# Patient Record
Sex: Female | Born: 1940 | Race: White | Hispanic: No | Smoking: Never smoker
Health system: Southern US, Community
[De-identification: ages and names within clinical notes are randomized; demographics above are authoritative.]

## PROBLEM LIST (undated history)

## (undated) DIAGNOSIS — M1990 Unspecified osteoarthritis, unspecified site: Secondary | ICD-10-CM

## (undated) DIAGNOSIS — R160 Hepatomegaly, not elsewhere classified: Secondary | ICD-10-CM

## (undated) DIAGNOSIS — E785 Hyperlipidemia, unspecified: Secondary | ICD-10-CM

## (undated) DIAGNOSIS — H53009 Unspecified amblyopia, unspecified eye: Secondary | ICD-10-CM

## (undated) DIAGNOSIS — I517 Cardiomegaly: Secondary | ICD-10-CM

## (undated) DIAGNOSIS — H3530 Unspecified macular degeneration: Secondary | ICD-10-CM

## (undated) DIAGNOSIS — I341 Nonrheumatic mitral (valve) prolapse: Secondary | ICD-10-CM

## (undated) DIAGNOSIS — E11319 Type 2 diabetes mellitus with unspecified diabetic retinopathy without macular edema: Secondary | ICD-10-CM

## (undated) DIAGNOSIS — I10 Essential (primary) hypertension: Secondary | ICD-10-CM

## (undated) DIAGNOSIS — E119 Type 2 diabetes mellitus without complications: Secondary | ICD-10-CM

## (undated) DIAGNOSIS — K5790 Diverticulosis of intestine, part unspecified, without perforation or abscess without bleeding: Secondary | ICD-10-CM

## (undated) DIAGNOSIS — K635 Polyp of colon: Secondary | ICD-10-CM

## (undated) DIAGNOSIS — K275 Chronic or unspecified peptic ulcer, site unspecified, with perforation: Secondary | ICD-10-CM

## (undated) DIAGNOSIS — J189 Pneumonia, unspecified organism: Secondary | ICD-10-CM

## (undated) DIAGNOSIS — I00 Rheumatic fever without heart involvement: Secondary | ICD-10-CM

## (undated) HISTORY — DX: Diverticulosis of intestine, part unspecified, without perforation or abscess without bleeding: K57.90

## (undated) HISTORY — PX: HEAD & NECK SKIN LESION EXCISIONAL BIOPSY: SUR472

## (undated) HISTORY — PX: BREAST EXCISIONAL BIOPSY: SUR124

## (undated) HISTORY — PX: APPENDECTOMY: SHX54

## (undated) HISTORY — DX: Hepatomegaly, not elsewhere classified: R16.0

## (undated) HISTORY — PX: CHOLECYSTECTOMY: SHX55

## (undated) HISTORY — PX: CATARACT EXTRACTION: SUR2

## (undated) HISTORY — DX: Unspecified amblyopia, unspecified eye: H53.009

## (undated) HISTORY — DX: Hyperlipidemia, unspecified: E78.5

## (undated) HISTORY — PX: DILATION AND CURETTAGE OF UTERUS: SHX78

## (undated) HISTORY — DX: Polyp of colon: K63.5

## (undated) HISTORY — DX: Pneumonia, unspecified organism: J18.9

## (undated) HISTORY — PX: ABDOMINAL HYSTERECTOMY: SHX81

## (undated) HISTORY — PX: BLADDER SUSPENSION: SHX72

## (undated) HISTORY — PX: BREAST BIOPSY: SHX20

## (undated) HISTORY — DX: Cardiomegaly: I51.7

## (undated) HISTORY — DX: Type 2 diabetes mellitus with unspecified diabetic retinopathy without macular edema: E11.319

## (undated) HISTORY — PX: EYE SURGERY: SHX253

---

## 2000-02-21 ENCOUNTER — Other Ambulatory Visit: Admission: RE | Admit: 2000-02-21 | Discharge: 2000-02-21 | Payer: Self-pay | Admitting: Internal Medicine

## 2000-03-07 ENCOUNTER — Encounter: Admission: RE | Admit: 2000-03-07 | Discharge: 2000-03-07 | Payer: Self-pay | Admitting: Internal Medicine

## 2000-03-07 ENCOUNTER — Encounter: Payer: Self-pay | Admitting: Internal Medicine

## 2000-06-21 ENCOUNTER — Encounter: Payer: Self-pay | Admitting: Internal Medicine

## 2000-06-21 ENCOUNTER — Encounter: Admission: RE | Admit: 2000-06-21 | Discharge: 2000-06-21 | Payer: Self-pay | Admitting: Internal Medicine

## 2001-03-16 ENCOUNTER — Encounter: Admission: RE | Admit: 2001-03-16 | Discharge: 2001-03-16 | Payer: Self-pay | Admitting: Internal Medicine

## 2001-03-16 ENCOUNTER — Encounter: Payer: Self-pay | Admitting: Internal Medicine

## 2001-04-13 ENCOUNTER — Other Ambulatory Visit: Admission: RE | Admit: 2001-04-13 | Discharge: 2001-04-13 | Payer: Self-pay | Admitting: Internal Medicine

## 2001-08-16 ENCOUNTER — Encounter (INDEPENDENT_AMBULATORY_CARE_PROVIDER_SITE_OTHER): Payer: Self-pay

## 2001-08-16 ENCOUNTER — Ambulatory Visit (HOSPITAL_COMMUNITY): Admission: RE | Admit: 2001-08-16 | Discharge: 2001-08-16 | Payer: Self-pay | Admitting: Obstetrics & Gynecology

## 2002-03-19 ENCOUNTER — Encounter: Payer: Self-pay | Admitting: Internal Medicine

## 2002-03-19 ENCOUNTER — Encounter: Admission: RE | Admit: 2002-03-19 | Discharge: 2002-03-19 | Payer: Self-pay | Admitting: Internal Medicine

## 2002-07-01 ENCOUNTER — Other Ambulatory Visit: Admission: RE | Admit: 2002-07-01 | Discharge: 2002-07-01 | Payer: Self-pay | Admitting: Obstetrics & Gynecology

## 2003-02-03 ENCOUNTER — Encounter: Admission: RE | Admit: 2003-02-03 | Discharge: 2003-02-03 | Payer: Self-pay | Admitting: General Surgery

## 2003-02-03 ENCOUNTER — Encounter: Payer: Self-pay | Admitting: General Surgery

## 2003-02-18 ENCOUNTER — Encounter: Payer: Self-pay | Admitting: Internal Medicine

## 2003-02-18 ENCOUNTER — Encounter: Admission: RE | Admit: 2003-02-18 | Discharge: 2003-02-18 | Payer: Self-pay | Admitting: Internal Medicine

## 2003-07-11 ENCOUNTER — Other Ambulatory Visit: Admission: RE | Admit: 2003-07-11 | Discharge: 2003-07-11 | Payer: Self-pay | Admitting: Obstetrics & Gynecology

## 2004-02-09 ENCOUNTER — Encounter: Admission: RE | Admit: 2004-02-09 | Discharge: 2004-02-09 | Payer: Self-pay | Admitting: Internal Medicine

## 2004-07-28 ENCOUNTER — Other Ambulatory Visit: Admission: RE | Admit: 2004-07-28 | Discharge: 2004-07-28 | Payer: Self-pay | Admitting: Obstetrics & Gynecology

## 2005-02-10 ENCOUNTER — Encounter: Admission: RE | Admit: 2005-02-10 | Discharge: 2005-02-10 | Payer: Self-pay | Admitting: Internal Medicine

## 2005-08-02 ENCOUNTER — Ambulatory Visit: Payer: Self-pay | Admitting: Internal Medicine

## 2005-08-16 ENCOUNTER — Ambulatory Visit: Payer: Self-pay | Admitting: Internal Medicine

## 2005-08-16 ENCOUNTER — Encounter (INDEPENDENT_AMBULATORY_CARE_PROVIDER_SITE_OTHER): Payer: Self-pay | Admitting: Specialist

## 2005-08-30 ENCOUNTER — Other Ambulatory Visit: Admission: RE | Admit: 2005-08-30 | Discharge: 2005-08-30 | Payer: Self-pay | Admitting: Obstetrics & Gynecology

## 2006-03-02 ENCOUNTER — Encounter: Admission: RE | Admit: 2006-03-02 | Discharge: 2006-03-02 | Payer: Self-pay | Admitting: Obstetrics & Gynecology

## 2006-11-28 ENCOUNTER — Encounter: Admission: RE | Admit: 2006-11-28 | Discharge: 2006-11-28 | Payer: Self-pay | Admitting: Internal Medicine

## 2007-03-05 ENCOUNTER — Encounter: Admission: RE | Admit: 2007-03-05 | Discharge: 2007-03-05 | Payer: Self-pay | Admitting: Internal Medicine

## 2008-04-11 ENCOUNTER — Encounter: Admission: RE | Admit: 2008-04-11 | Discharge: 2008-04-11 | Payer: Self-pay | Admitting: Obstetrics & Gynecology

## 2008-04-22 ENCOUNTER — Encounter: Admission: RE | Admit: 2008-04-22 | Discharge: 2008-04-22 | Payer: Self-pay | Admitting: Obstetrics & Gynecology

## 2008-05-28 ENCOUNTER — Ambulatory Visit (HOSPITAL_BASED_OUTPATIENT_CLINIC_OR_DEPARTMENT_OTHER): Admission: RE | Admit: 2008-05-28 | Discharge: 2008-05-28 | Payer: Self-pay | Admitting: Urology

## 2008-05-28 ENCOUNTER — Encounter (INDEPENDENT_AMBULATORY_CARE_PROVIDER_SITE_OTHER): Payer: Self-pay | Admitting: Urology

## 2009-04-13 ENCOUNTER — Encounter: Admission: RE | Admit: 2009-04-13 | Discharge: 2009-04-13 | Payer: Self-pay | Admitting: Internal Medicine

## 2009-11-21 DIAGNOSIS — K275 Chronic or unspecified peptic ulcer, site unspecified, with perforation: Secondary | ICD-10-CM

## 2009-11-21 HISTORY — DX: Chronic or unspecified peptic ulcer, site unspecified, with perforation: K27.5

## 2010-04-12 ENCOUNTER — Inpatient Hospital Stay (HOSPITAL_COMMUNITY): Admission: EM | Admit: 2010-04-12 | Discharge: 2010-04-16 | Payer: Self-pay | Admitting: Emergency Medicine

## 2010-04-30 ENCOUNTER — Encounter: Admission: RE | Admit: 2010-04-30 | Discharge: 2010-04-30 | Payer: Self-pay | Admitting: Internal Medicine

## 2010-07-16 ENCOUNTER — Encounter: Payer: Self-pay | Admitting: Internal Medicine

## 2010-07-28 ENCOUNTER — Encounter (INDEPENDENT_AMBULATORY_CARE_PROVIDER_SITE_OTHER): Payer: Self-pay | Admitting: *Deleted

## 2010-08-23 ENCOUNTER — Encounter (INDEPENDENT_AMBULATORY_CARE_PROVIDER_SITE_OTHER): Payer: Self-pay | Admitting: *Deleted

## 2010-08-24 ENCOUNTER — Ambulatory Visit: Payer: Self-pay | Admitting: Internal Medicine

## 2010-08-25 ENCOUNTER — Telehealth: Payer: Self-pay | Admitting: Internal Medicine

## 2010-09-09 ENCOUNTER — Ambulatory Visit: Payer: Self-pay | Admitting: Internal Medicine

## 2010-09-10 ENCOUNTER — Encounter: Payer: Self-pay | Admitting: Internal Medicine

## 2010-12-12 ENCOUNTER — Encounter: Payer: Self-pay | Admitting: Specialist

## 2010-12-12 ENCOUNTER — Encounter: Payer: Self-pay | Admitting: Internal Medicine

## 2010-12-23 NOTE — Letter (Signed)
Summary: Patient Notice- Polyp Results  [name]  [address] [place], [place] [address]   Phone: [phone]  Fax: [phone]        September 10, 2010 MRN: [id number]    Mon[place] [address] [place], [place]  [address]    Dear Ms. Atayde,  I am pleased to inform you that the colon polyp(s) removed during your recent colonoscopy was (were) found to be benign (no cancer detected) upon pathologic examination.  I recommend you have a repeat colonoscopy examination in 5 years to look for recurrent polyps, as having colon polyps increases your risk for having recurrent polyps or even colon cancer in the future.  Should you develop new or worsening symptoms of abdominal pain, bowel habit changes or bleeding from the rectum or bowels, please schedule an evaluation with either your primary care physician or with me.  Additional information/recommendations:  __ No further action with gastroenterology is needed at this time. Please      follow-up with your primary care physician for your other healthcare      needs.   Please call us if you are having persistent problems or have questions about your condition that have not been fully answered at this time.  Sincerely,  Hilarie Fredrickson MD  This letter has been electronically signed by your physician.  Appended Document: Patient Notice- Polyp Results letter mailed

## 2010-12-23 NOTE — Letter (Signed)
Summary: Colonoscopy Letter  [facility]  [address] [place], [place] [address]   Phone: [phone]  Fax: [phone]      July 16, 2010 MRN: [id number]   Monica [place] [address] [place], [place]  [address]   Dear [place],   According to your medical record, it is time for you to schedule a Colonoscopy. The American Cancer Society recommends this procedure as a method to detect early colon cancer. Patients with a family history of colon cancer, or a personal history of colon polyps or inflammatory bowel disease are at increased risk.  This letter has been generated based on the recommendations made at the time of your procedure. If you feel that in your particular situation this may no longer apply, please contact our office.  Please call our office at [phone] to schedule this appointment or to update your records at your earliest convenience.  Thank you for cooperating with [place] to provide you with the very best care possible.   Sincerely,  Wilhemina Bonito. Marina Goodell, M.D.  [facility] Gastroenterology Division [phone]

## 2010-12-23 NOTE — Progress Notes (Signed)
Summary: Movi prep Rx.  Phone Note Call from Patient   Caller: Patient Summary of Call: Pharmacy did not receive Rx. for Moviprep.  Sent today. [facility] C[facility]  August 25, 2010 10:26 AM     New/Updated Medications: MOVIPREP 100 GM  SOLR (PEG-KCL-NACL-NASULF-NA ASC-C) As per prep instructions. Prescriptions: MOVIPREP 100 GM  SOLR (PEG-KCL-NACL-NASULF-NA ASC-C) As per prep instructions.  #1 x 0   Entered by:   Milford Cage NCMA   Authorized by:   Hilarie Fredrickson MD   Signed by:   Milford Cage NCMA on 08/25/2010   Method used:   [facility] [phone]* (retail)       [address]       [place], [place]  [address]       Ph: [phone] or [phone]       Fax: [phone]   RxID:   [id number]

## 2010-12-23 NOTE — Letter (Signed)
Summary: Pre Visit Letter Revised  [facility]  [address] [place], [place] [address]   Phone: [phone]  Fax: [phone]        07/28/2010 MRN: [id number] [place] [address] [place], [place]  [address]             Procedure Date:  09/09/2010   Welcome to the Gastroenterology Division at [facility].    You are scheduled to see a nurse for your pre-procedure visit on 08/24/2010 at 11:00AM on the 3rd floor at [facility], [address]  We ask that you try to arrive at our office 15 minutes prior to your appointment time to allow for check-in.  Please take a minute to review the attached form.  If you answer "Yes" to one or more of the questions on the first page, we ask that you call the person listed at your earliest opportunity.  If you answer "No" to all of the questions, please complete the rest of the form and bring it to your appointment.    Your nurse visit will consist of discussing your medical and surgical history, your immediate family medical history, and your medications.   If you are unable to list all of your medications on the form, please bring the medication bottles to your appointment and we will list them.  We will need to be aware of both prescribed and over the counter drugs.  We will need to know exact dosage information as well.    Please be prepared to read and sign documents such as consent forms, a financial agreement, and acknowledgement forms.  If necessary, and with your consent, a friend or relative is welcome to sit-in on the nurse visit with you.  Please bring your insurance card so that we may make a copy of it.  If your insurance requires a referral to see a specialist, please bring your referral form from your primary care physician.  No co-pay is required for this nurse visit.     If you cannot keep your appointment, please call [phone] to cancel or reschedule prior to your appointment date.  This allows [place]  the opportunity to schedule an appointment for another patient in need of care.    Thank you for choosing  Gastroenterology for your medical needs.  We appreciate the opportunity to care for you.  Please visit [place] at our website  to learn more about our practice.  Sincerely, The Gastroenterology Division

## 2010-12-23 NOTE — Procedures (Signed)
Summary: Colonoscopy  Patient: [place] Note: All result statuses are Final unless otherwise noted.  Tests: (1) Colonoscopy (COL)   COL Colonoscopy           DONE      [facility]     [address]     [place], [place]  [address]           COLONOSCOPY PROCEDURE REPORT           PATIENT:  [place], Monica [place]  MR#:  [id number]     BIRTHDATE:  June 15, 1941, 69 yrs. old  GENDER:  female     ENDOSCOPIST:  Wilhemina Bonito. Eda Keys, MD     REF. BY:  Surveillance Program Recall     PROCEDURE DATE:  09/09/2010     PROCEDURE:  Colonoscopy with snare polypectomy x 1     ASA CLASS:  Class II     INDICATIONS:  history of pre-cancerous (adenomatous) colon polyps,     surveillance and high-risk screening     MEDICATIONS:   Fentanyl 75 mcg IV, Versed 7 mg IV           DESCRIPTION OF PROCEDURE:   After the risks benefits and     alternatives of the procedure were thoroughly explained, informed     consent was obtained.  Digital rectal exam was performed and     revealed no abnormalities.   The LB CF-H180AL [id number] endoscope     was introduced through the anus and advanced to the cecum, which     was identified by both the appendix and ileocecal valve, without     limitations.The time to the cecum = 4:48 min. The quality of the     prep was excellent, using MoviPrep.  The instrument was then     slowly withdrawn (time = 14:15 min) as the colon was fully     examined.     <<PROCEDUREIMAGES>>           FINDINGS:  A diminutive polyp was found in the distal transverse     colon. Polyp was snared without cautery. Retrieval was successful.     Mild diverticulosis was found in the sigmoid colon.  This was     otherwise a normal examination of the colon.   Retroflexed views     in the rectum revealed no abnormalities.    The scope was then     withdrawn from the patient and the procedure completed.           COMPLICATIONS:  None     ENDOSCOPIC IMPRESSION:     1) Diminutive polyp in the distal  transverse colon -removed     2) Mild diverticulosis in the sigmoid colon     3) Otherwise normal examination           RECOMMENDATIONS:     1) Follow up colonoscopy in 5 years           ______________________________     Wilhemina Bonito. Eda Keys, MD           CC:  Geoffry Paradise, MD; The Patient           n.     eSIGNED:   Wilhemina Bonito. Eda Keys at 09/09/2010 12:46 PM           Doree Barthel, [id number]  Note: An exclamation mark (!) indicates a result that was not dispersed into the flowsheet. Document Creation Date: 09/09/2010 12:47 PM _______________________________________________________________________  (1)  Order result status: Final Collection or observation date-time: 09/09/2010 12:39 Requested date-time:  Receipt date-time:  Reported date-time:  Referring Physician:   Ordering Physician: Fransico Setters [phone]) Specimen Source:  Source: Launa Grill Order Number: [id number] Lab site:   Appended Document: Colonoscopy     Procedures Next Due Date:    Colonoscopy: 08/2015

## 2010-12-23 NOTE — Letter (Signed)
Summary: [facility] Instructions  [facility]  [address] [place], [place] [address]   Phone: [phone]  Fax: [phone]       LAKEESHA FONTANILLA    1941-11-01    MRN: [id number]        Procedure Day /Date: Thursday 09-09-10     Arrival Time: 10:00 am     Procedure Time: 11:00 am     Location of Procedure:                    _x _  [facility] (4th Floor)   PREPARATION FOR COLONOSCOPY WITH MOVIPREP   Starting 5 days prior to your procedure  09-04-10 do not eat nuts, seeds, popcorn, corn, beans, peas,  salads, or any raw vegetables.  Do not take any fiber supplements (e.g. Metamucil, Citrucel, and Benefiber).  THE DAY BEFORE YOUR PROCEDURE         DATE:  09-08-10  DAY: Wednesday  1.  Drink clear liquids the entire day-NO SOLID FOOD  2.  Do not drink anything colored red or purple.  Avoid juices with pulp.  No orange juice.  3.  Drink at least 64 oz. (8 glasses) of fluid/clear liquids during the day to prevent dehydration and help the prep work efficiently.  CLEAR LIQUIDS INCLUDE: Water Jello Ice Popsicles Tea (sugar ok, no milk/cream) Powdered fruit flavored drinks Coffee (sugar ok, no milk/cream) Gatorade Juice: apple, white grape, white cranberry  Lemonade Clear bullion, consomm, broth Carbonated beverages (any kind) Strained chicken noodle soup Hard Candy                             4.  In the morning, mix first dose of MoviPrep solution:    Empty 1 Pouch A and 1 Pouch B into the disposable container    Add lukewarm drinking water to the top line of the container. Mix to dissolve    Refrigerate (mixed solution should be used within 24 hrs)  5.  Begin drinking the prep at 5:00 p.m. The MoviPrep container is divided by 4 marks.   Every 15 minutes drink the solution down to the next mark (approximately 8 oz) until the full liter is complete.   6.  Follow completed prep with 16 oz of clear liquid of your choice (Nothing red or  purple).  Continue to drink clear liquids until bedtime.  7.  Before going to bed, mix second dose of MoviPrep solution:    Empty 1 Pouch A and 1 Pouch B into the disposable container    Add lukewarm drinking water to the top line of the container. Mix to dissolve    Refrigerate  THE DAY OF YOUR PROCEDURE      DATE:  09-09-10  DAY:  Thursday  Beginning at  6:00 a.m. (5 hours before procedure):         1. Every 15 minutes, drink the solution down to the next mark (approx 8 oz) until the full liter is complete.  2. Follow completed prep with 16 oz. of clear liquid of your choice.    3. You may drink clear liquids until  9:00 a.m. (2 HOURS BEFORE PROCEDURE).   MEDICATION INSTRUCTIONS  Unless otherwise instructed, you should take regular prescription medications with a small sip of water   as early as possible the morning of your procedure.   Additional medication instructions: _Hold your lisinopril the am of your  procedure         OTHER INSTRUCTIONS  You will need a responsible adult at least 68 years of age to accompany you and drive you home.   This person must remain in the waiting room during your procedure.  Wear loose fitting clothing that is easily removed.  Leave jewelry and other valuables at home.  However, you may wish to bring a book to read or  an iPod/MP3 player to listen to music as you wait for your procedure to start.  Remove all body piercing jewelry and leave at home.  Total time from sign-in until discharge is approximately 2-3 hours.  You should go home directly after your procedure and rest.  You can resume normal activities the  day after your procedure.  The day of your procedure you should not:   Drive   Make legal decisions   Operate machinery   Drink alcohol   Return to work  You will receive specific instructions about eating, activities and medications before you leave.    The above instructions have been reviewed and  explained to me by   Clide Cliff, RN______________________    I fully understand and can verbalize these instructions _____________________________ Date _________

## 2010-12-23 NOTE — Miscellaneous (Signed)
Summary: previsit   Clinical Lists Changes  Medications: Added new medication of MOVIPREP 100 GM  SOLR (PEG-KCL-NACL-NASULF-NA ASC-C) As directed - Signed Rx of MOVIPREP 100 GM  SOLR (PEG-KCL-NACL-NASULF-NA ASC-C) As directed;  #1 x 0;  Signed;  Entered by: Clide Cliff RN;  Authorized by: Hilarie Fredrickson MD;  Method used: [facility] [phone]*, [address] [place], [place], [place]  [address], Ph: [phone] or [phone], Fax: [phone] Observations: Added new observation of ALLERGY REV: Done (08/24/2010 10:35)    Prescriptions: MOVIPREP 100 GM  SOLR (PEG-KCL-NACL-NASULF-NA ASC-C) As directed  #1 x 0   Entered by:   Clide Cliff RN   Authorized by:   Hilarie Fredrickson MD   Signed by:   Clide Cliff RN on 08/24/2010   Method used:   [facility]ronically to        [facility] [phone]* (retail)       [address]       [place], [place]  [address]       Ph: [phone] or [phone]       Fax: [phone]   RxID:   [id number]

## 2011-02-07 LAB — URINALYSIS, ROUTINE W REFLEX MICROSCOPIC
Glucose, UA: >1000 mg/dL — AB
Leukocytes, UA: NEGATIVE
Protein, ur: NEGATIVE mg/dL
Specific Gravity, Urine: 1.015 (ref 1.005–1.030)
pH: 7 (ref 5.0–8.0)

## 2011-02-07 LAB — DIFFERENTIAL
Basophils Absolute: 0 10*3/uL (ref 0.0–0.1)
Basophils Absolute: 0 10*3/uL (ref 0.0–0.1)
Basophils Absolute: 0 10*3/uL (ref 0.0–0.1)
Basophils Absolute: 0.2 10*3/uL — ABNORMAL HIGH (ref 0.0–0.1)
Basophils Relative: 0 % (ref 0–1)
Basophils Relative: 0 % (ref 0–1)
Basophils Relative: 0 % (ref 0–1)
Basophils Relative: 2 % — ABNORMAL HIGH (ref 0–1)
Eosinophils Absolute: 0 10*3/uL (ref 0.0–0.7)
Eosinophils Absolute: 0 10*3/uL (ref 0.0–0.7)
Eosinophils Absolute: 0.1 10*3/uL (ref 0.0–0.7)
Eosinophils Absolute: 0.2 10*3/uL (ref 0.0–0.7)
Eosinophils Relative: 0 % (ref 0–5)
Eosinophils Relative: 2 % (ref 0–5)
Lymphocytes Relative: 18 % (ref 12–46)
Lymphs Abs: 1.4 10*3/uL (ref 0.7–4.0)
Lymphs Abs: 1.8 10*3/uL (ref 0.7–4.0)
Monocytes Absolute: 0.3 10*3/uL (ref 0.1–1.0)
Monocytes Relative: 2 % — ABNORMAL LOW (ref 3–12)
Monocytes Relative: 2 % — ABNORMAL LOW (ref 3–12)
Monocytes Relative: 7 % (ref 3–12)
Myelocytes: 0 %
Neutro Abs: 11.7 10*3/uL — ABNORMAL HIGH (ref 1.7–7.7)
Neutro Abs: 15.4 10*3/uL — ABNORMAL HIGH (ref 1.7–7.7)
Neutro Abs: 16 10*3/uL — ABNORMAL HIGH (ref 1.7–7.7)
Neutro Abs: 7.7 10*3/uL (ref 1.7–7.7)
Neutrophils Relative %: 75 % (ref 43–77)
Neutrophils Relative %: 82 % — ABNORMAL HIGH (ref 43–77)
Neutrophils Relative %: 89 % — ABNORMAL HIGH (ref 43–77)
Neutrophils Relative %: 92 % — ABNORMAL HIGH (ref 43–77)
nRBC: 0 /100 WBC

## 2011-02-07 LAB — BASIC METABOLIC PANEL
BUN: 11 mg/dL (ref 6–23)
BUN: 7 mg/dL (ref 6–23)
BUN: 8 mg/dL (ref 6–23)
CO2: 25 mEq/L (ref 19–32)
Calcium: 8.1 mg/dL — ABNORMAL LOW (ref 8.4–10.5)
Calcium: 8.1 mg/dL — ABNORMAL LOW (ref 8.4–10.5)
Calcium: 8.2 mg/dL — ABNORMAL LOW (ref 8.4–10.5)
Calcium: 8.4 mg/dL (ref 8.4–10.5)
Chloride: 100 mEq/L (ref 96–112)
Chloride: 104 mEq/L (ref 96–112)
Creatinine, Ser: 0.47 mg/dL (ref 0.4–1.2)
Creatinine, Ser: 0.55 mg/dL (ref 0.4–1.2)
Creatinine, Ser: 0.6 mg/dL (ref 0.4–1.2)
Creatinine, Ser: 0.63 mg/dL (ref 0.4–1.2)
GFR calc Af Amer: >60 mL/min (ref >60)
GFR calc Af Amer: >60 mL/min (ref >60)
GFR calc non Af Amer: >60 mL/min (ref >60)
GFR calc non Af Amer: >60 mL/min (ref >60)
GFR calc non Af Amer: >60 mL/min (ref >60)
Glucose, Bld: 220 mg/dL — ABNORMAL HIGH (ref 70–99)
Potassium: 3.2 mEq/L — ABNORMAL LOW (ref 3.5–5.1)

## 2011-02-07 LAB — PHOSPHORUS
Phosphorus: 1.9 mg/dL — ABNORMAL LOW (ref 2.3–4.6)
Phosphorus: 2.4 mg/dL (ref 2.3–4.6)
Phosphorus: 2.5 mg/dL (ref 2.3–4.6)

## 2011-02-07 LAB — CBC
HCT: 32.9 % — ABNORMAL LOW (ref 36.0–46.0)
HCT: 38.5 % (ref 36.0–46.0)
Hemoglobin: 11.5 g/dL — ABNORMAL LOW (ref 12.0–15.0)
Hemoglobin: 13.6 g/dL (ref 12.0–15.0)
MCHC: 34.1 g/dL (ref 30.0–36.0)
MCHC: 35 g/dL (ref 30.0–36.0)
MCHC: 35.3 g/dL (ref 30.0–36.0)
MCHC: 35.5 g/dL (ref 30.0–36.0)
MCV: 87.5 fL (ref 78.0–100.0)
MCV: 87.9 fL (ref 78.0–100.0)
MCV: 89.1 fL (ref 78.0–100.0)
Platelets: 172 10*3/uL (ref 150–400)
Platelets: 182 10*3/uL (ref 150–400)
Platelets: 195 10*3/uL (ref 150–400)
Platelets: 215 10*3/uL (ref 150–400)
RBC: 3.42 MIL/uL — ABNORMAL LOW (ref 3.87–5.11)
RBC: 3.48 MIL/uL — ABNORMAL LOW (ref 3.87–5.11)
RBC: 4.4 MIL/uL (ref 3.87–5.11)
RDW: 12.8 % (ref 11.5–15.5)
RDW: 13.3 % (ref 11.5–15.5)
WBC: 10.1 10*3/uL (ref 4.0–10.5)
WBC: 14.2 10*3/uL — ABNORMAL HIGH (ref 4.0–10.5)
WBC: 17.8 10*3/uL — ABNORMAL HIGH (ref 4.0–10.5)

## 2011-02-07 LAB — COMPREHENSIVE METABOLIC PANEL
ALT: 16 U/L (ref 0–35)
AST: 19 U/L (ref 0–37)
Albumin: 3.8 g/dL (ref 3.5–5.2)
BUN: 11 mg/dL (ref 6–23)
Creatinine, Ser: 0.6 mg/dL (ref 0.4–1.2)
Potassium: 3 mEq/L — ABNORMAL LOW (ref 3.5–5.1)
Total Protein: 6.8 g/dL (ref 6.0–8.3)

## 2011-02-07 LAB — URINE MICROSCOPIC-ADD ON

## 2011-02-07 LAB — MAGNESIUM: Magnesium: 2.3 mg/dL (ref 1.5–2.5)

## 2011-02-07 LAB — LIPASE, BLOOD: Lipase: 27 U/L (ref 11–59)

## 2011-02-07 LAB — CANCER ANTIGEN 19-9: CA 19-9: 69.7 U/mL — ABNORMAL HIGH (ref <35.0)

## 2011-04-05 ENCOUNTER — Other Ambulatory Visit: Payer: Self-pay | Admitting: Internal Medicine

## 2011-04-05 DIAGNOSIS — Z1231 Encounter for screening mammogram for malignant neoplasm of breast: Secondary | ICD-10-CM

## 2011-04-05 NOTE — Op Note (Signed)
NAMEANDERSYN, FRAGOSO                ACCOUNT NO.:  [id number]   MEDICAL RECORD NO.:  [id number]          PATIENT TYPE:  AMB   LOCATION:  [facility]                         FACILITY:  [facility]   PHYSICIAN:  Ronald L. Earlene Plater, M.D.  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   OPERATIVE PROCEDURES:  1. Cystourethroscopy.  2. Hydraulic bladder distention.  3. Bladder biopsy.   SURGEON:  Lucrezia Starch. Earlene Plater, M.D.   ANESTHESIA:  LMA.   ESTIMATED BLOOD LOSS:  Negligible.   TUBES:  None.   COMPLICATIONS:  None.   TOTAL BLADDER CAPACITY:  900 mL at 80 cm water pressure for anterior  bladder walls and posterior bladder wall glomerulations, but no frank  cracks were noted in the bladder.   INDICATIONS FOR PROCEDURE:  Ms. Stauffer is a lovely 70 year old white  female who has presented with persistent pelvic pain and urinary  symptoms.  Over the last 5-6 months she has developed progressive  urgency, frequency, some urge incontinence, and pelvic pain especially  in the left lower quadrant.  She has had episodes in the past, and  appeared to be progressing.  She had urethropexy in 1999 with a  hysterectomy previously.  The pain did not appear be related to  urination, and was not really relieved with urination.  A CT scan of the  pelvis revealed of no significant abnormalities.  A KUB had no bony  defects noted; and after understanding the risks, benefits, and  alternatives; she has elected to proceed with the above procedure.   PROCEDURE IN DETAIL:  The patient was placed in the supine position.  After proper LMA anesthesia, was placed in the dorsal lithotomy  position; and prepped and draped with Betadine in a sterile fashion.  Cystourethroscopy was performed with a 22.5 [place] Olympus panendoscope.  Utilizing the 12-and-7-degree lenses the bladder was carefully  inspected  and noted to be without lesions.  There was mild circumferential  urethritis and efflux of clear urine was noted from the normally placed  ureteral orifices bilaterally.  The bladder was then distended gradually  to 80 cm of water pressure, and total capacity was 900 mL.  When the  pressure was relieved, there was significant glomerulations in the  anterior bladder wall, although the dome and lateral bladder wall was  relatively spared.  There was also some glomerulations  in the posterior  bladder wall approaching the mid trigone.   Under fluoroscopic guidance a biopsy was taken from the posterior  midline and sent to pathology, and the base was cauterized with Bugbee  coagulation cautery.  No other lesions were noted be present.  The  bladder was drained.  The panendoscope was removed, and the patient was  taken to the recovery room stable.      Ronald L. Earlene Plater, M.D.  Electronically Signed     RLD/MEDQ  D:  05/28/2008  T:  05/28/2008  Job:  [id number]

## 2011-04-08 NOTE — Op Note (Signed)
[facility]  Patient:    [place], Monica [place] Visit Number: [id number] MRN: [id number]          Service Type: DSU Location: [facility] Attending Physician:  Minette Headland Dictated by:   Freddy Finner, M.D. Proc. Date: 08/16/01 Admit Date:  08/16/2001                             Operative Report  PREOPERATIVE DIAGNOSES:       1. Persistent left pelvic and left lower                                  quadrant abdominal pain.                               2. Small complex cystic area on pelvic                                  ultrasound.                               3. Previous total abdominal hysterectomy and                                  right salpingo-oophorectomy.  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  OPERATION:                    1. Laparoscopy.                               2. Lysis of adhesions.                               3. Left salpingo-oophorectomy.  SURGEON:                      Freddy Finner, M.D.  ANESTHESIA:                   General endotracheal anesthesia.  ESTIMATED INTRAOPERATIVE BLOOD LOSS: Less than or equal 50 cc.  INTRAOPERATIVE COMPLICATIONS: None.  INDICATIONS:                  The patient is a 70 year old white married female, gravida 2, para 1, who had a total abdominal hysterectomy and right salpingo-oophorectomy in the 1980s.   Approximately three months prior to this admission, she had the onset of left lower quadrant abdominal pain which has persisted.  She describes the pain as dull and present on a daily basis. She cannot make and association between GI or GU symptoms.  Her pelvic examination in the office revealed a possible cystic structure in the left adnexa.  No other abnormalities were identified.  Pelvic ultrasound did reveal two small cystic areas in the left adnexa and the left ovary.  The uterus and right adnexa were  surgically absent.  The patient was scheduled from laparoscopy and returned for  followup at which time she was continuing to have pain three days prior to this procedure, and she is admitted now to proceed with diagnostic laparoscopy and left left salpingo-oophorectomy.  INTRAOPERATIVE FINDINGS:      As noted in the postoperative diagnosis and are recorded in photographs which are retained in the office record.  DESCRIPTION OF PROCEDURE:     The patient was admitted on the morning of surgery.  She was brought to the operating room, there placed under adequate general endotracheal anesthesia, placed in the dorsolithotomy position using the Sheldahl stirrup system.  Betadine prep of the abdomen was carried out. Bladder was evacuated using sterile technique.  Sponge forceps with the sponge at the tip was placed in the vaginal canal for potential use for identification during procedure.  Sterile drapes were applied.  Two small incisions were made, one at the umbilicus, one just above the symphysis. Through the upper incision, an 11 mm disposable trocar was introduced anterior abdominal wall manually.  Direct inspection revealed adequate placement with no evidence of injury on entry.  Pneumoperitoneum was allowed to accumulate with carbon dioxide gas.  A 5 mm trocar was placed through the lower incision under direct visualization.  Using a blunt probe and carefully examining pelvic contents, the above findings were noted with dense adhesion to the ileum, to the left ovary, and to the left pelvic peritoneum.  No apparent abnormalities were noted in the right pelvis or upper abdomen.  Using grasping forceps and the blunt probe and later the Nezhat system to the lower trocar sleeve, the adhesions were progressively lysed using reticulating Endo shears through the operating channel of the laparoscope.   A small fragment of ovary was intentionally left on the ileum to make certain that no injury  was encountered to the bowel.  Near the completion of the procedure, the small piece of ovary was fulgurated with a bipolar forceps.  This was felt to essentially complete the removal of the ovarian tissue.  After freeing up the adhesions, the infundibulopelvic ligament was developed using bipolar coagulation and sharp division to complete the remove of the left tube and ovary.  The ovary was morcellated and removed through the umbilical trocar sleeve.  Copious irrigation was carried out.  Bipolar coagulation was used to control all bleeding of the dissecting sites.  It was felt that complete hemostasis had been achieved.  All the irrigation solution was aspirated from the abdomen.  The solution of lactated Ringers used in the Nezhat irrigation system.  Gas was allowed to escape from the abdomen.  Skin incisions were closed with interrupted subcuticular sutures of 3-0 Dexon.  Marcaine 0.25% was injected into the incision sites for postoperative analgesia.  Steri-Strips were applied to the lower incision.  The patient was awakened and taken to the recovery room in good condition and will be discharged in the immediate postoperative period for followup in the office in approximately two weeks. She was given routine outpatient surgical instructions.  She is to take a regular diet.  She is to take Vicodin as needed for postoperative pain. Dictated by:   Freddy Finner, M.D. Attending Physician:  Minette Headland DD:  08/16/01 TD:  08/16/01 Job: [id number]

## 2011-05-02 ENCOUNTER — Ambulatory Visit
Admission: RE | Admit: 2011-05-02 | Discharge: 2011-05-02 | Disposition: A | Discharge status: Home / Self Care | Payer: Medicare Other | Source: Ambulatory Visit | Attending: Internal Medicine | Admitting: Internal Medicine

## 2011-05-02 DIAGNOSIS — Z1231 Encounter for screening mammogram for malignant neoplasm of breast: Secondary | ICD-10-CM

## 2011-08-18 LAB — POCT HEMOGLOBIN-HEMACUE: Hemoglobin: 14.7

## 2011-08-18 LAB — BASIC METABOLIC PANEL
CO2: 29
Chloride: 105
Creatinine, Ser: 0.72
GFR calc Af Amer: >60
Glucose, Bld: 180 — ABNORMAL HIGH

## 2012-03-27 ENCOUNTER — Other Ambulatory Visit: Payer: Self-pay | Admitting: Internal Medicine

## 2012-03-27 DIAGNOSIS — Z1231 Encounter for screening mammogram for malignant neoplasm of breast: Secondary | ICD-10-CM

## 2012-05-02 ENCOUNTER — Ambulatory Visit
Admission: RE | Admit: 2012-05-02 | Discharge: 2012-05-02 | Disposition: A | Discharge status: Home / Self Care | Payer: Medicare Other | Source: Ambulatory Visit | Attending: Internal Medicine | Admitting: Internal Medicine

## 2012-05-02 DIAGNOSIS — Z1231 Encounter for screening mammogram for malignant neoplasm of breast: Secondary | ICD-10-CM

## 2013-01-16 NOTE — Progress Notes (Signed)
Dr Simonne Come-  Need PRE OP ORDERS PLACED IN EPIC PLEASE-  Has PST appt 01/21/13  [facility]

## 2013-01-17 ENCOUNTER — Other Ambulatory Visit: Payer: Self-pay | Admitting: Orthopedic Surgery

## 2013-01-17 ENCOUNTER — Encounter (HOSPITAL_COMMUNITY): Payer: Self-pay | Admitting: Pharmacy Technician

## 2013-01-21 ENCOUNTER — Encounter (HOSPITAL_COMMUNITY)
Admission: RE | Admit: 2013-01-21 | Discharge: 2013-01-21 | Disposition: A | Discharge status: Home / Self Care | Payer: Medicare Other | Source: Ambulatory Visit | Attending: Orthopedic Surgery | Admitting: Orthopedic Surgery

## 2013-01-21 ENCOUNTER — Encounter (HOSPITAL_COMMUNITY): Payer: Self-pay

## 2013-01-21 ENCOUNTER — Ambulatory Visit (HOSPITAL_COMMUNITY)
Admission: RE | Admit: 2013-01-21 | Discharge: 2013-01-21 | Disposition: A | Discharge status: Home / Self Care | Payer: Medicare Other | Source: Ambulatory Visit | Attending: Orthopedic Surgery | Admitting: Orthopedic Surgery

## 2013-01-21 DIAGNOSIS — I517 Cardiomegaly: Secondary | ICD-10-CM | POA: Insufficient documentation

## 2013-01-21 DIAGNOSIS — X58XXXA Exposure to other specified factors, initial encounter: Secondary | ICD-10-CM | POA: Insufficient documentation

## 2013-01-21 DIAGNOSIS — S43429A Sprain of unspecified rotator cuff capsule, initial encounter: Secondary | ICD-10-CM | POA: Insufficient documentation

## 2013-01-21 DIAGNOSIS — Z01812 Encounter for preprocedural laboratory examination: Secondary | ICD-10-CM | POA: Insufficient documentation

## 2013-01-21 HISTORY — DX: Essential (primary) hypertension: I10

## 2013-01-21 HISTORY — DX: Chronic or unspecified peptic ulcer, site unspecified, with perforation: K27.5

## 2013-01-21 HISTORY — DX: Nonrheumatic mitral (valve) prolapse: I34.1

## 2013-01-21 HISTORY — DX: Type 2 diabetes mellitus without complications: E11.9

## 2013-01-21 HISTORY — DX: Unspecified osteoarthritis, unspecified site: M19.90

## 2013-01-21 LAB — BASIC METABOLIC PANEL
BUN: 12 mg/dL (ref 6–23)
Chloride: 103 mEq/L (ref 96–112)
GFR calc Af Amer: >90 mL/min (ref >90)
Potassium: 3.9 mEq/L (ref 3.5–5.1)

## 2013-01-21 LAB — CBC
HCT: 39 % (ref 36.0–46.0)
Hemoglobin: 12.9 g/dL (ref 12.0–15.0)
WBC: 5.9 10*3/uL (ref 4.0–10.5)

## 2013-01-21 LAB — SURGICAL PCR SCREEN
MRSA, PCR: NEGATIVE
Staphylococcus aureus: NEGATIVE

## 2013-01-21 NOTE — Patient Instructions (Addendum)
20 Monica Hamilton  01/21/2013   Your procedure is scheduled on:  01/30/13  [facility]  Report to [facility] at  1100     AM.  Call this number if you have problems the morning of surgery: [phone]       Remember: DO NOT TAKE ANY BLOOD SUGAR MEDICATION MORNING OF SURGERY  Do not eat food  Or drink :After Midnight.  TUESDAY   Take these medicines the morning of surgery with A SIP OF WATER:  BYSTOLIC,  PROTONIX   .  Contacts, dentures or partial plates can not be worn to surgery  Leave suitcase in the car. After surgery it may be brought to your room.  For patients admitted to the hospital, checkout time is 11:00 AM day of  discharge.             SPECIAL INSTRUCTIONS- SEE [place] PREPARING FOR SURGERY INSTRUCTION SHEET-     DO NOT WEAR JEWELRY, LOTIONS, POWDERS, OR PERFUMES.  WOMEN-- DO NOT SHAVE LEGS OR UNDERARMS FOR 12 HOURS BEFORE SHOWERS. MEN MAY SHAVE FACE.  Patients discharged the day of surgery will not be allowed to drive home. IF going home the day of surgery, you must have a driver and someone to stay with you for the first 24 hours  Name and phone number of your driver:   Husband  RALPH                                                                     Please read over the following fact sheets that you were given: MRSA Information, Incentive Spirometry Sheet, Blood Transfusion Sheet  Information                                                                                   [name]  PST 336  [phone]                 FAILURE TO FOLLOW THESE INSTRUCTIONS MAY RESULT IN  CANCELLATION   OF YOUR SURGERY                                                  Patient Signature _____________________________

## 2013-01-30 ENCOUNTER — Ambulatory Visit (HOSPITAL_COMMUNITY): Payer: Medicare Other | Admitting: Anesthesiology

## 2013-01-30 ENCOUNTER — Ambulatory Visit (HOSPITAL_COMMUNITY)
Admission: RE | Admit: 2013-01-30 | Discharge: 2013-01-30 | Disposition: A | Discharge status: Home / Self Care | Payer: Medicare Other | Source: Ambulatory Visit | Attending: Orthopedic Surgery | Admitting: Orthopedic Surgery

## 2013-01-30 ENCOUNTER — Encounter (HOSPITAL_COMMUNITY)
Admission: RE | Disposition: A | Discharge status: Home / Self Care | Payer: Self-pay | Source: Ambulatory Visit | Attending: Orthopedic Surgery

## 2013-01-30 ENCOUNTER — Encounter (HOSPITAL_COMMUNITY): Payer: Self-pay | Admitting: *Deleted

## 2013-01-30 ENCOUNTER — Encounter (HOSPITAL_COMMUNITY): Payer: Self-pay | Admitting: Anesthesiology

## 2013-01-30 DIAGNOSIS — E119 Type 2 diabetes mellitus without complications: Secondary | ICD-10-CM | POA: Insufficient documentation

## 2013-01-30 DIAGNOSIS — M719 Bursopathy, unspecified: Secondary | ICD-10-CM | POA: Insufficient documentation

## 2013-01-30 DIAGNOSIS — I10 Essential (primary) hypertension: Secondary | ICD-10-CM | POA: Insufficient documentation

## 2013-01-30 DIAGNOSIS — M25819 Other specified joint disorders, unspecified shoulder: Secondary | ICD-10-CM | POA: Insufficient documentation

## 2013-01-30 DIAGNOSIS — M67919 Unspecified disorder of synovium and tendon, unspecified shoulder: Secondary | ICD-10-CM | POA: Insufficient documentation

## 2013-01-30 DIAGNOSIS — Z79899 Other long term (current) drug therapy: Secondary | ICD-10-CM | POA: Insufficient documentation

## 2013-01-30 DIAGNOSIS — M24119 Other articular cartilage disorders, unspecified shoulder: Secondary | ICD-10-CM | POA: Insufficient documentation

## 2013-01-30 HISTORY — PX: SHOULDER ARTHROSCOPY WITH SUBACROMIAL DECOMPRESSION: SHX5684

## 2013-01-30 LAB — GLUCOSE, CAPILLARY
Glucose-Capillary: 153 mg/dL — ABNORMAL HIGH (ref 70–99)
Glucose-Capillary: 156 mg/dL — ABNORMAL HIGH (ref 70–99)

## 2013-01-30 SURGERY — SHOULDER ARTHROSCOPY WITH SUBACROMIAL DECOMPRESSION
Anesthesia: General | Site: Shoulder | Laterality: Right | Wound class: Clean

## 2013-01-30 MED ORDER — ROPIVACAINE HCL 5 MG/ML IJ SOLN
INTRAMUSCULAR | Status: AC
  Filled 2013-01-30: qty 30

## 2013-01-30 MED ORDER — POVIDONE-IODINE 7.5 % EX SOLN: Freq: Once | CUTANEOUS | Status: DC

## 2013-01-30 MED ORDER — FENTANYL CITRATE 0.05 MG/ML IJ SOLN: 25.0000 ug | INTRAMUSCULAR | Status: DC | PRN

## 2013-01-30 MED ORDER — ACETAMINOPHEN 10 MG/ML IV SOLN
INTRAVENOUS | Status: AC
  Filled 2013-01-30: qty 100

## 2013-01-30 MED ORDER — ACETAMINOPHEN 10 MG/ML IV SOLN
INTRAVENOUS | Status: DC | PRN
  Administered 2013-01-30: 1000 mg via INTRAVENOUS

## 2013-01-30 MED ORDER — DEXAMETHASONE SODIUM PHOSPHATE 10 MG/ML IJ SOLN
INTRAMUSCULAR | Status: DC | PRN
  Administered 2013-01-30: 10 mg via INTRAVENOUS

## 2013-01-30 MED ORDER — LACTATED RINGERS IV SOLN
INTRAVENOUS | Status: DC | PRN
  Administered 2013-01-30: 13:00:00 via INTRAVENOUS
  Administered 2013-01-30: 1000 mL

## 2013-01-30 MED ORDER — PROMETHAZINE HCL 25 MG/ML IJ SOLN: 6.2500 mg | INTRAMUSCULAR | Status: DC | PRN

## 2013-01-30 MED ORDER — METHOCARBAMOL 500 MG PO TABS: 500.0000 mg | ORAL_TABLET | Freq: Four times a day (QID) | ORAL | Status: DC | PRN

## 2013-01-30 MED ORDER — ROPIVACAINE HCL 5 MG/ML IJ SOLN
INTRAMUSCULAR | Status: DC | PRN
  Administered 2013-01-30: 20 mL

## 2013-01-30 MED ORDER — FENTANYL CITRATE 0.05 MG/ML IJ SOLN
INTRAMUSCULAR | Status: DC | PRN
  Administered 2013-01-30: 100 ug via INTRAVENOUS

## 2013-01-30 MED ORDER — BUPIVACAINE-EPINEPHRINE 0.5% -1:200000 IJ SOLN
INTRAMUSCULAR | Status: DC | PRN
  Administered 2013-01-30: 6 mL

## 2013-01-30 MED ORDER — MIDAZOLAM HCL 5 MG/5ML IJ SOLN
INTRAMUSCULAR | Status: DC | PRN
  Administered 2013-01-30 (×2): 1 mg via INTRAVENOUS

## 2013-01-30 MED ORDER — OXYCODONE-ACETAMINOPHEN 5-325 MG PO TABS: 1.0000 | ORAL_TABLET | ORAL | Status: DC | PRN

## 2013-01-30 MED ORDER — SUCCINYLCHOLINE CHLORIDE 20 MG/ML IJ SOLN
INTRAMUSCULAR | Status: DC | PRN
  Administered 2013-01-30: 100 mg via INTRAVENOUS

## 2013-01-30 MED ORDER — KETOROLAC TROMETHAMINE 30 MG/ML IJ SOLN: 15.0000 mg | Freq: Once | INTRAMUSCULAR | Status: DC | PRN

## 2013-01-30 MED ORDER — PROPOFOL 10 MG/ML IV BOLUS
INTRAVENOUS | Status: DC | PRN
  Administered 2013-01-30: 140 mg via INTRAVENOUS

## 2013-01-30 MED ORDER — LACTATED RINGERS IR SOLN
Status: DC | PRN
  Administered 2013-01-30: 3000 mL

## 2013-01-30 MED ORDER — ONDANSETRON HCL 4 MG/2ML IJ SOLN
INTRAMUSCULAR | Status: DC | PRN
  Administered 2013-01-30: 4 mg via INTRAVENOUS

## 2013-01-30 MED ORDER — BUPIVACAINE HCL (PF) 0.5 % IJ SOLN
INTRAMUSCULAR | Status: AC
  Filled 2013-01-30: qty 30

## 2013-01-30 MED ORDER — EPHEDRINE SULFATE 50 MG/ML IJ SOLN
INTRAMUSCULAR | Status: DC | PRN
  Administered 2013-01-30 (×3): 10 mg via INTRAVENOUS

## 2013-01-30 SURGICAL SUPPLY — 32 items
BLADE 4.2CUDA (BLADE) ×2 IMPLANT
BLADE SURG SZ11 CARB STEEL (BLADE) ×2 IMPLANT
BOOTIES KNEE HIGH SLOAN (MISCELLANEOUS) ×2 IMPLANT
BUR OVAL 4.0 (BURR) ×2 IMPLANT
CLOTH BEACON ORANGE TIMEOUT ST (SAFETY) ×2 IMPLANT
DRAPE SHOULDER BEACH CHAIR (DRAPES) ×2 IMPLANT
DRAPE U-SHAPE 47X51 STRL (DRAPES) ×2 IMPLANT
DRSG ADAPTIC 3X8 NADH LF (GAUZE/BANDAGES/DRESSINGS) ×1 IMPLANT
DRSG PAD ABDOMINAL 8X10 ST (GAUZE/BANDAGES/DRESSINGS) ×2 IMPLANT
DURAPREP 26ML APPLICATOR (WOUND CARE) ×2 IMPLANT
GLOVE BIO SURGEON STRL SZ7.5 (GLOVE) ×2 IMPLANT
GLOVE BIO SURGEON STRL SZ8 (GLOVE) ×4 IMPLANT
GLOVE BIOGEL M 8.0 STRL (GLOVE) ×2 IMPLANT
GLOVE ECLIPSE 8.0 STRL XLNG CF (GLOVE) ×2 IMPLANT
GLOVE INDICATOR 8.0 STRL GRN (GLOVE) ×4 IMPLANT
GOWN STRL REIN XL XLG (GOWN DISPOSABLE) ×4 IMPLANT
KIT POSITION SHOULDER SCHLEI (MISCELLANEOUS) ×2 IMPLANT
MANIFOLD NEPTUNE II (INSTRUMENTS) ×2 IMPLANT
NDL SPNL 18GX3.5 QUINCKE PK (NEEDLE) ×1 IMPLANT
NEEDLE SPNL 18GX3.5 QUINCKE PK (NEEDLE) ×2 IMPLANT
PACK SHOULDER CUSTOM OPM052 (CUSTOM PROCEDURE TRAY) ×2 IMPLANT
POSITIONER SURGICAL ARM (MISCELLANEOUS) ×2 IMPLANT
SET ARTHROSCOPY TUBING (MISCELLANEOUS) ×2
SET ARTHROSCOPY TUBING LN (MISCELLANEOUS) ×1 IMPLANT
SLING ARM IMMOBILIZER LRG (SOFTGOODS) ×2 IMPLANT
SLING ARM IMMOBILIZER MED (SOFTGOODS) IMPLANT
SPONGE GAUZE 4X4 12PLY (GAUZE/BANDAGES/DRESSINGS) ×1 IMPLANT
STRAP CHIN BCCS-OSI (MISCELLANEOUS) ×2 IMPLANT
SUT ETHILON 4 0 PS 2 18 (SUTURE) ×2 IMPLANT
TAPE CLOTH SURG 4X10 WHT LF (GAUZE/BANDAGES/DRESSINGS) ×2 IMPLANT
TUBING CONNECTING 10 (TUBING) ×4 IMPLANT
WAND 90 DEG TURBOVAC W/CORD (SURGICAL WAND) ×2 IMPLANT

## 2013-01-30 NOTE — Anesthesia Postprocedure Evaluation (Signed)
  Anesthesia Post-op Note  Patient: Monica Hamilton  Procedure(s) Performed: Procedure(s) (LRB): SHOULDER ARTHROSCOPY WITH SUBACROMIAL DECOMPRESSION (Right)  Patient Location: PACU  Anesthesia Type: General  Level of Consciousness: awake and alert   Airway and Oxygen Therapy: Patient Spontanous Breathing  Post-op Pain: mild  Post-op Assessment: Post-op Vital signs reviewed, Patient's Cardiovascular Status Stable, Respiratory Function Stable, Patent Airway and No signs of Nausea or vomiting  Last Vitals:  Filed Vitals:   01/30/13 1545  BP:   Pulse: 78  Temp:   Resp: 9    Post-op Vital Signs: stable   Complications: No apparent anesthesia complications

## 2013-01-30 NOTE — Progress Notes (Signed)
Dr. Acey Lav in to check pt; will continue to observe in PACU and encourage deep breathing and coughing

## 2013-01-30 NOTE — Progress Notes (Signed)
Pt up in room and ambulated to bathroom with stand by assist.  Pt tolerated well without difficulty or complaints.

## 2013-01-30 NOTE — Anesthesia Preprocedure Evaluation (Signed)
Anesthesia Evaluation  Patient identified by MRN, date of birth, ID band Patient awake    Reviewed: Allergy & Precautions, H&P , NPO status , Patient's Chart, lab work & pertinent test results  Airway Mallampati: II TM Distance: >3 FB Neck ROM: Full    Dental no notable dental hx.    Pulmonary neg pulmonary ROS,  breath sounds clear to auscultation  Pulmonary exam normal       Cardiovascular hypertension, Pt. on medications Rhythm:Regular Rate:Normal     Neuro/Psych negative neurological ROS  negative psych ROS   GI/Hepatic negative GI ROS, Neg liver ROS,   Endo/Other  diabetes, Oral Hypoglycemic Agents  Renal/GU negative Renal ROS  negative genitourinary   Musculoskeletal negative musculoskeletal ROS (+)   Abdominal   Peds negative pediatric ROS (+)  Hematology negative hematology ROS (+)   Anesthesia Other Findings   Reproductive/Obstetrics negative OB ROS                           Anesthesia Physical Anesthesia Plan  ASA: II  Anesthesia Plan:    Post-op Pain Management:    Induction: Intravenous  Airway Management Planned: Oral ETT  Additional Equipment:   Intra-op Plan:   Post-operative Plan: Extubation in OR  Informed Consent: I have reviewed the patients History and Physical, chart, labs and discussed the procedure including the risks, benefits and alternatives for the proposed anesthesia with the patient or authorized representative who has indicated his/her understanding and acceptance.   Dental advisory given  Plan Discussed with: CRNA and Surgeon  Anesthesia Plan Comments:         Anesthesia Quick Evaluation

## 2013-01-30 NOTE — Preoperative (Signed)
Beta Blockers   Reason not to administer Beta Blockers:Not Applicable 

## 2013-01-30 NOTE — Brief Op Note (Signed)
01/30/2013  3:00 PM  PATIENT:  Monica Hamilton  72 y.o. female  PRE-OPERATIVE DIAGNOSIS:  PARTIAL RIGHT SHOULDER ROTATOR CUFF TEAR  POST-OPERATIVE DIAGNOSIS:  Partial righ shoulder rotator cuff tear  PROCEDURE:  Procedure(s) with comments: SHOULDER ARTHROSCOPY WITH SUBACROMIAL DECOMPRESSION (Right) - with labral debridement  SURGEON:  Surgeon(s) and Role:    * Drucilla Schmidt, MD - Primary  PHYSICIAN ASSISTANT:   ASSISTANTS: MrUnderwood PAC  ANESTHESIA:   regional and general  EBL:     BLOOD ADMINISTERED:none  DRAINS: none   LOCAL MEDICATIONS USED:  MARCAINE     SPECIMEN:  No Specimen  DISPOSITION OF SPECIMEN:  N/A  COUNTS:  YES  TOURNIQUET:  * No tourniquets in log *  DICTATION: .Other Dictation: Dictation Number [id number]  PLAN OF CARE: Discharge to home after PACU  PATIENT DISPOSITION:  PACU - hemodynamically stable.   Delay start of Pharmacological VTE agent (>24hrs) due to surgical blood loss or risk of bleeding: yes

## 2013-01-30 NOTE — H&P (Signed)
Monica Hamilton is an 72 y.o. female.   Chief Complaintpainful rt shoulder HPI:MRI demonstrates less than 50% partial rotator cuff tear  Past Medical History  Diagnosis Date  . Hypertension   . Diabetes mellitus without complication   . Perforated ulcer 2011  . Arthritis   . Mitral valve prolapse     Past Surgical History  Procedure Laterality Date  . Abdominal hysterectomy    . Cholecystectomy    . Appendectomy    . Bladder suspension    . Breast surgery      left biopsy  . Head & neck skin lesion excisional biopsy      top left scalp  . Dilation and curettage of uterus      History reviewed. No pertinent family history. Social History:  reports that she has never smoked. She has never used smokeless tobacco. She reports that she does not drink alcohol or use illicit drugs.  Allergies: No Known Allergies  Medications Prior to Admission  Medication Sig Dispense Refill  . lisinopril-hydrochlorothiazide (PRINZIDE,ZESTORETIC) 20-12.5 MG per tablet Take 1 tablet by mouth 2 (two) times daily.      . metFORMIN (GLUCOPHAGE) 500 MG tablet Take 500 mg by mouth 2 (two) times daily.      . Nebivolol HCl (BYSTOLIC) 20 MG TABS Take 20 mg by mouth daily after breakfast.       . pantoprazole (PROTONIX) 40 MG tablet Take 40 mg by mouth as needed.       Marland Kitchen aspirin EC 81 MG tablet Take 81 mg by mouth every morning.      . Calcium Citrate (CITRACAL PO) Take 1 tablet by mouth daily.        Results for orders placed during the hospital encounter of 01/30/13 (from the past 48 hour(s))  GLUCOSE, CAPILLARY     Status: Abnormal   Collection Time    01/30/13 11:17 AM      Result Value Range   Glucose-Capillary 153 (*) 70 - 99 mg/dL   No results found.  ROS  Blood pressure 166/75, pulse 62, temperature 97.9 F (36.6 C), resp. rate 20, SpO2 98.00%. Physical Exam  Constitutional: She is oriented to person, place, and time. She appears well-developed and well-nourished.  HENT:  Head:  Normocephalic and atraumatic.  Right Ear: External ear normal.  Left Ear: External ear normal.  Nose: Nose normal.  Mouth/Throat: Oropharynx is clear and moist.  Eyes: Conjunctivae and EOM are normal. Pupils are equal, round, and reactive to light.  Neck: Normal range of motion. Neck supple.  Cardiovascular: Normal rate, regular rhythm, normal heart sounds and intact distal pulses.   Respiratory: Effort normal and breath sounds normal.  GI: Soft. Bowel sounds are normal.  Musculoskeletal:  She is having an interscalene block rt sholder  Neurological: She is alert and oriented to person, place, and time. She has normal reflexes.  Skin: Skin is warm and dry.  Psychiatric: She has a normal mood and affect. Her behavior is normal. Judgment and thought content normal.     Assessment/Plan Partial rotator cuff tear rt shoulder Rt shoulder with SAD  APLINGTON,JAMES P 01/30/2013, 1:16 PM

## 2013-01-30 NOTE — Transfer of Care (Signed)
Immediate Anesthesia Transfer of Care Note  Patient: Monica Hamilton  Procedure(s) Performed: Procedure(s) with comments: SHOULDER ARTHROSCOPY WITH SUBACROMIAL DECOMPRESSION (Right) - with labral debridement  Patient Location: PACU  Anesthesia Type:General  Level of Consciousness: awake, alert , oriented and patient cooperative  Airway & Oxygen Therapy: Patient Spontanous Breathing and Patient connected to face mask oxygen  Post-op Assessment: Report given to PACU RN, Post -op Vital signs reviewed and stable and Patient moving all extremities  Post vital signs: Reviewed and stable  Complications: No apparent anesthesia complications

## 2013-01-30 NOTE — Anesthesia Procedure Notes (Signed)
Anesthesia Regional Block:  Interscalene brachial plexus block  Pre-Anesthetic Checklist: ,, timeout performed, Correct Patient, Correct Site, Correct Laterality, Correct Procedure, Correct Position, site marked, Risks and benefits discussed,  Surgical consent,  Pre-op evaluation,  At surgeon's request and post-op pain management   Prep: chloraprep       Needles:  Injection technique: Single-shot  Needle Type: Echogenic Needle     Needle Length:cm 9 cm Needle Gauge: 21 G    Additional Needles:  Procedures: ultrasound guided (picture in chart) Interscalene brachial plexus block Narrative:  Injection made incrementally with aspirations every 5 mL.  Performed by: Personally  Anesthesiologist: Roiza Wiedel MD  Additional Notes: Patient tolerated the procedure well without complications  Interscalene brachial plexus block   

## 2013-01-30 NOTE — Progress Notes (Signed)
Pt's SAO2 drops to upper 80's; deep breathing, coughing, and incentive spirometer used

## 2013-01-31 ENCOUNTER — Encounter (HOSPITAL_COMMUNITY): Payer: Self-pay | Admitting: Orthopedic Surgery

## 2013-01-31 NOTE — Op Note (Signed)
NAMEELFREDA, BLANCHET                ACCOUNT NO.:  [id number]  MEDICAL RECORD NO.:  [id number]  LOCATION:  [facility]                         FACILITY:  [facility]  PHYSICIAN:  Marlowe Kays, M.D.  DATE OF BIRTH:  1941/07/19  DATE OF PROCEDURE:  01/30/2013 DATE OF DISCHARGE:  01/30/2013                              OPERATIVE REPORT   PREOPERATIVE DIAGNOSIS:  Partial rotator cuff tear, right shoulder secondary to impingement.  POSTOPERATIVE DIAGNOSES: 1. Partial rotator cuff tear, right shoulder secondary to impingement. 2. Labral degenerative tear.  OPERATION:  Right shoulder arthroscopy with, 1. Debridement of labrum. 2. Arthroscopic subacromial decompression.  SURGEON:  Marlowe Kays, M.D.  ASSISTANTDruscilla Brownie. Idolina Primer, PA-C.  ANESTHESIA:  General preceded by interscalene block.  PATHOLOGY AND JUSTIFICATION FOR PROCEDURE:  Painful right shoulder with an MRI demonstrating a partial rotator cuff tear, which was less than 50% interstitial tear.  At surgery, we also found what appeared to be a significant labral tear.  PROCEDURE:  Interscalene block by anesthesia.  Satisfied general anesthesia.  Beach chair position on the sliding frame.  The right shoulder girdle was prepped with DuraPrep, draped in sterile field.  The anatomy of the shoulder joint was marked out and for hemostatic purposes.  I injected the posterior soft spot and subacromial space with Marcaine with adrenaline.  After time-out was performed, through posterior soft spot portal atraumatically entered the glenohumeral joint.  There was some synovitis present.  No rotator cuff tears identified.  Biceps tendon looked normal.  Minimal wear on the humeral head.  She did have a fairly modest degenerative type labral tear around the biceps anchor.  Accordingly, I advanced the scope between the biceps tendon and subscapularis.  Using switching stick, made an anterior incision over it followed by a metal cannula and a 4.2  shaver entering the knee joint.  I then cleaned up the labrum with pre and post films being taken.  I then redirected the scope into the subacromial space and through a lateral portal, placed a 4.2 shaver.  She had a gluteal bursitis present and also very little space between the rotator cuff and the overlying bone.  I then used a 90-degree ArthroCare vaporizer to remove soft tissue from the undersurface of the acromion, and I followed this with a 4-mm oval bur beginning burred down the acromion.  I went back and forth between the bur and the vaporizer until we had a wide decompression.  The [facility] joint was identified and was noted pressure on the rotator cuff from it.  Final pictures were taken to her side arm abducted.  I also looked at the rotator cuff which were a little warm. It was clearly being injured but there was no frank tear.  All fluid possible was then removed from the subacromial space.  I closed the three portals with 4-0 nylon.  Betadine, Adaptic, dry sterile dressing, shoulder immobilizer were applied.  She tolerated the procedure well and was taken to recovery room in satisfactory condition with no known complications.          ______________________________ Marlowe Kays, M.D.     JA/MEDQ  D:  01/30/2013  T:  01/31/2013  Job:  [id number]

## 2013-04-02 ENCOUNTER — Other Ambulatory Visit: Payer: Self-pay | Admitting: Obstetrics & Gynecology

## 2013-04-02 DIAGNOSIS — N644 Mastodynia: Secondary | ICD-10-CM

## 2013-04-12 ENCOUNTER — Ambulatory Visit
Admission: RE | Admit: 2013-04-12 | Discharge: 2013-04-12 | Disposition: A | Discharge status: Home / Self Care | Payer: Medicare Other | Source: Ambulatory Visit | Attending: Obstetrics & Gynecology | Admitting: Obstetrics & Gynecology

## 2013-04-12 DIAGNOSIS — N644 Mastodynia: Secondary | ICD-10-CM

## 2013-05-01 ENCOUNTER — Other Ambulatory Visit: Payer: Self-pay

## 2013-05-01 DIAGNOSIS — Z1231 Encounter for screening mammogram for malignant neoplasm of breast: Secondary | ICD-10-CM

## 2013-05-15 ENCOUNTER — Ambulatory Visit
Admission: RE | Admit: 2013-05-15 | Discharge: 2013-05-15 | Disposition: A | Discharge status: Home / Self Care | Payer: Medicare Other | Source: Ambulatory Visit

## 2013-05-15 DIAGNOSIS — Z1231 Encounter for screening mammogram for malignant neoplasm of breast: Secondary | ICD-10-CM

## 2014-04-08 ENCOUNTER — Other Ambulatory Visit: Payer: Self-pay

## 2014-04-08 DIAGNOSIS — Z1231 Encounter for screening mammogram for malignant neoplasm of breast: Secondary | ICD-10-CM

## 2014-05-16 ENCOUNTER — Ambulatory Visit
Admission: RE | Admit: 2014-05-16 | Discharge: 2014-05-16 | Disposition: A | Discharge status: Home / Self Care | Payer: Medicare Other | Source: Ambulatory Visit

## 2014-05-16 DIAGNOSIS — Z1231 Encounter for screening mammogram for malignant neoplasm of breast: Secondary | ICD-10-CM

## 2014-11-04 ENCOUNTER — Encounter: Payer: Self-pay | Admitting: Internal Medicine

## 2014-12-15 ENCOUNTER — Other Ambulatory Visit: Payer: Self-pay | Admitting: Obstetrics & Gynecology

## 2014-12-16 LAB — CYTOLOGY - PAP

## 2014-12-31 ENCOUNTER — Encounter: Payer: Self-pay | Admitting: Internal Medicine

## 2014-12-31 ENCOUNTER — Other Ambulatory Visit (INDEPENDENT_AMBULATORY_CARE_PROVIDER_SITE_OTHER): Payer: Medicare Other

## 2014-12-31 ENCOUNTER — Ambulatory Visit (INDEPENDENT_AMBULATORY_CARE_PROVIDER_SITE_OTHER): Payer: Medicare Other | Admitting: Internal Medicine

## 2014-12-31 VITALS — BP 144/84 | HR 76 | Ht 66.5 in | Wt 162.5 lb

## 2014-12-31 DIAGNOSIS — R194 Change in bowel habit: Secondary | ICD-10-CM

## 2014-12-31 DIAGNOSIS — Z860100 Personal history of colon polyps, unspecified: Secondary | ICD-10-CM

## 2014-12-31 DIAGNOSIS — R1032 Left lower quadrant pain: Secondary | ICD-10-CM

## 2014-12-31 DIAGNOSIS — Z87440 Personal history of urinary (tract) infections: Secondary | ICD-10-CM

## 2014-12-31 LAB — URINALYSIS
Bilirubin Urine: NEGATIVE
Hgb urine dipstick: NEGATIVE
Ketones, ur: NEGATIVE
Leukocytes, UA: NEGATIVE
Nitrite: NEGATIVE
PH: 6 (ref 5.0–8.0)
SPECIFIC GRAVITY, URINE: 1.02 (ref 1.000–1.030)
UROBILINOGEN UA: 0.2 (ref 0.0–1.0)

## 2014-12-31 MED ORDER — MOVIPREP 100 G PO SOLR: 1.0000 | Freq: Once | ORAL | Status: DC

## 2014-12-31 NOTE — Progress Notes (Signed)
HISTORY OF PRESENT ILLNESS:  Monica Hamilton is a 74 y.o. female with multiple medical problems as listed below. She is referred today for consultation by her primary care physician, Dr. Jacky KindleAronson, regarding chronic left lower quadrant abdominal pain (outside office records and data reviewed). The patient has a history of remote perforated duodenal ulcer for which she was cared for elsewhere. The patient was last seen here in October 2011 when she underwent colonoscopy for history of adenomatous polyps. A diminutive adenoma was removed. Incidental mild sigmoid diverticulosis noted. Follow-up in 5 years recommended. The patient's current history is that of left lower quadrant discomfort which began approximately 2-1/2 months ago. She describes this as a localized "puffiness" below the umbilicus on the left. There is also associated warm sensation. The discomfort is more noticeable with positional change. She is able to gain relief within minutes. There is no effect by meals. She has had increased frequency of bowel movements without bleeding. Her weight has been stable. No fevers. Defecation does not affect pain. Overall since its inception, symptoms have improved. She does take aspirin. She is also on metformin. A contrast-enhanced CT scan of the abdomen and pelvis was performed 10/31/2014. She was noted to have fatty liver with a small hepatic cyst as well as a small pancreatic head cyst. Otherwise negative. She has had prior surgeries including appendectomy, hysterectomy, and cholecystectomy. Review of outside records reviews of the patient had a urinary tract infection June 2015. No repeat urinalysis since treating. She denies dysuria or flank pain  REVIEW OF SYSTEMS:  All non-GI ROS negative except for arthritis  Past Medical History  Diagnosis Date  . Hypertension   . Diabetes mellitus without complication   . Perforated ulcer 2011  . Arthritis   . Mitral valve prolapse   . Colon polyp    adenomarous  . Hepatomegaly   . Diverticulosis   . Left ventricular hypertrophy   . Hyperlipidemia   . Pneumonia     Past Surgical History  Procedure Laterality Date  . Abdominal hysterectomy    . Cholecystectomy    . Appendectomy    . Bladder suspension    . Breast biopsy Left   . Head & neck skin lesion excisional biopsy      top left scalp  . Dilation and curettage of uterus    . Shoulder arthroscopy with subacromial decompression Right 01/30/2013    Procedure: SHOULDER ARTHROSCOPY WITH SUBACROMIAL DECOMPRESSION;  Surgeon: Drucilla SchmidtJames P Aplington, MD;  Location: [facility];  Service: Orthopedics;  Laterality: Right;  with labral debridement    Social History Monica PitchKaren P Hamilton  reports that she has never smoked. She has never used smokeless tobacco. She reports that she does not drink alcohol or use illicit drugs.  family history includes Breast cancer in her maternal aunt; Diabetes in her brother; Heart disease in her father; Leukemia in her sister.  No Known Allergies     PHYSICAL EXAMINATION: Vital signs: BP 144/84 mmHg  Pulse 76  Ht 5' 6.5" (1.689 m)  Wt 162 lb 8 oz (73.71 kg)  BMI 25.84 kg/m2  Constitutional: generally well-appearing, no acute distress Psychiatric: alert and oriented x3, cooperative Eyes: extraocular movements intact, anicteric, conjunctiva pink Mouth: oral pharynx moist, no lesions Neck: supple no lymphadenopathy Cardiovascular: heart regular rate and rhythm, no murmur Lungs: clear to auscultation bilaterally Abdomen: soft, nontender, nondistended, no obvious ascites, no peritoneal signs, normal bowel sounds, no organomegaly. Prior surgical incisions well-healed without hernia Rectal: Deferred until colonoscopy Extremities:  no lower extremity edema bilaterally Skin: no lesions on visible extremities Neuro: No focal deficits.   ASSESSMENT:  #1. The left lower quadrant discomfort as described. May be musculoskeletal related to adhesions based on history  and negative workup to date #2. Increased frequency of bowel habits. Nonspecific #3. History of adenomatous colon polyps. Due for follow-up as year #4. History of UTI #5. History of perforated duodenal ulcer. Taking aspirin but not on PPI  PLAN:  #1. Urinalysis. Rule out recurrent UTI as cause for left lower quadrant discomfort #2. Surveillance colonoscopy to provide polyp surveillance and evaluate vague change in bowel habits, as well as abdominal pain.The nature of the procedure, as well as the risks, benefits, and alternatives were carefully and thoroughly reviewed with the patient. Ample time for discussion and questions allowed. The patient understood, was satisfied, and agreed to proceed. #3. Recommend daily PPI since taking daily aspirin to reduce the risk of ulcer recurrence in a patient with complicated ulcer history

## 2014-12-31 NOTE — Patient Instructions (Signed)
Your physician has requested that you go to the basement for the following lab work before leaving today:  Urinalysis   You have been scheduled for a colonoscopy. Please follow written instructions given to you at your visit today.  Please pick up your prep kit at the pharmacy within the next 1-3 days. If you use inhalers (even only as needed), please bring them with you on the day of your procedure. Your physician has requested that you go to www.startemmi.com and enter the access code given to you at your visit today. This web site gives a general overview about your procedure. However, you should still follow specific instructions given to you by our office regarding your preparation for the procedure.

## 2015-01-02 ENCOUNTER — Encounter: Payer: Self-pay | Admitting: Internal Medicine

## 2015-01-02 ENCOUNTER — Ambulatory Visit (AMBULATORY_SURGERY_CENTER): Payer: Medicare Other | Admitting: Internal Medicine

## 2015-01-02 VITALS — BP 155/90 | HR 76 | Temp 96.9°F | Resp 23 | Ht 66.5 in | Wt 162.0 lb

## 2015-01-02 DIAGNOSIS — R1032 Left lower quadrant pain: Secondary | ICD-10-CM

## 2015-01-02 DIAGNOSIS — Z86010 Personal history of colonic polyps: Secondary | ICD-10-CM

## 2015-01-02 MED ORDER — SODIUM CHLORIDE 0.9 % IV SOLN: 500.0000 mL | INTRAVENOUS | Status: DC

## 2015-01-02 NOTE — Patient Instructions (Signed)

## 2015-01-02 NOTE — Progress Notes (Signed)
Report to PACU, RN, vss, BBS= Clear.  

## 2015-01-02 NOTE — Op Note (Signed)
[facility] [address] [place], [id number]   COLONOSCOPY PROCEDURE REPORT  PATIENT: Monica Hamilton, Monica Hamilton  MR#: [id number] BIRTHDATE: 1940/12/08 , 73  yrs. old GENDER: female ENDOSCOPIST: Roxy CedarJohn N Danely Bayliss Jr, MD REFERRED AO:[id number]:Richard Jacky KindleAronson, M.D. PROCEDURE DATE:  01/02/2015 PROCEDURE:   Colonoscopy, surveillance First Screening Colonoscopy - Avg.  risk and is 50 yrs.  old or older - No.  Prior Negative Screening - Now for repeat screening. N/A  History of Adenoma - Now for follow-up colonoscopy & has been > or = to 3 yrs.  Yes hx of adenoma.  Has been 3 or more years since last colonoscopy.  Polyps Removed Today? No.  Recommend repeat exam, <10 yrs? No. ASA CLASS:   Class II INDICATIONS:follow up of adenomatous colonic polyp(s) (prior exam 2011 elsewhere  w/ small TA), abdominal pain in the lower left quadrant, and change in bowel habits. MEDICATIONS: Propofol 200 mg IV, Monitored anesthesia care, and Lidocaine 200 mg IV  DESCRIPTION OF PROCEDURE:   After the risks benefits and alternatives of the procedure were thoroughly explained, informed consent was obtained.  The digital rectal exam revealed no abnormalities of the rectum.   The LB [id number]-[id number]  endoscope was introduced through the anus and advanced to the cecum, which was identified by both the appendix and ileocecal valve. No adverse events experienced.   The quality of the prep was good, using MoviPrep  The instrument was then slowly withdrawn as the colon was fully examined.     COLON FINDINGS: A normal appearing cecum, ileocecal valve, and appendiceal orifice were identified.  The ascending, transverse, descending, sigmoid colon, and rectum appeared unremarkable. Retroflexed views revealed no abnormalities. The time to cecum=2 minutes 44 seconds.  Withdrawal time=8 minutes 37 seconds.  The scope was withdrawn and the procedure completed.  COMPLICATIONS: There were no immediate  complications.  ENDOSCOPIC IMPRESSION: 1. Normal colonoscopy  RECOMMENDATIONS: 1. Return to the care of your primary provider.  GI follow up as needed  eSigned:  Roxy CedarJohn N Daneen Volcy Jr, MD 01/02/2015 9:21 AM   cc: Geoffry Paradiseichard Aronson, MD and The Patient

## 2015-01-05 ENCOUNTER — Telehealth: Payer: Self-pay | Admitting: *Deleted

## 2015-01-05 NOTE — Telephone Encounter (Signed)
  Follow up Call-  Call back number 01/02/2015  Post procedure Call Back phone  # [phone]  Permission to leave phone message Yes     Patient questions:  Do you have a fever, pain , or abdominal swelling? No. Pain Score  0 *  Have you tolerated food without any problems? Yes.    Have you been able to return to your normal activities? Yes.    Do you have any questions about your discharge instructions: Diet   No. Medications  No. Follow up visit  No.  Do you have questions or concerns about your Care? No.  Actions: * If pain score is 4 or above: No action needed, pain <4.

## 2015-04-13 ENCOUNTER — Other Ambulatory Visit: Payer: Self-pay

## 2015-04-13 DIAGNOSIS — Z1231 Encounter for screening mammogram for malignant neoplasm of breast: Secondary | ICD-10-CM

## 2015-04-27 ENCOUNTER — Other Ambulatory Visit: Payer: Self-pay | Admitting: Obstetrics & Gynecology

## 2015-04-28 LAB — CYTOLOGY - PAP

## 2015-05-18 ENCOUNTER — Ambulatory Visit
Admission: RE | Admit: 2015-05-18 | Discharge: 2015-05-18 | Disposition: A | Discharge status: Home / Self Care | Payer: Medicare Other | Source: Ambulatory Visit

## 2015-05-18 DIAGNOSIS — Z1231 Encounter for screening mammogram for malignant neoplasm of breast: Secondary | ICD-10-CM

## 2016-04-08 ENCOUNTER — Other Ambulatory Visit: Payer: Self-pay

## 2016-04-08 DIAGNOSIS — Z1231 Encounter for screening mammogram for malignant neoplasm of breast: Secondary | ICD-10-CM

## 2016-05-18 ENCOUNTER — Other Ambulatory Visit: Payer: Self-pay | Admitting: Internal Medicine

## 2016-05-18 ENCOUNTER — Ambulatory Visit
Admission: RE | Admit: 2016-05-18 | Discharge: 2016-05-18 | Disposition: A | Discharge status: Home / Self Care | Payer: Medicare Other | Source: Ambulatory Visit

## 2016-05-18 DIAGNOSIS — Z1231 Encounter for screening mammogram for malignant neoplasm of breast: Secondary | ICD-10-CM

## 2017-04-05 ENCOUNTER — Other Ambulatory Visit: Payer: Self-pay | Admitting: Internal Medicine

## 2017-04-05 DIAGNOSIS — Z1231 Encounter for screening mammogram for malignant neoplasm of breast: Secondary | ICD-10-CM

## 2017-05-22 ENCOUNTER — Ambulatory Visit
Admission: RE | Admit: 2017-05-22 | Discharge: 2017-05-22 | Disposition: A | Discharge status: Home / Self Care | Payer: Medicare Other | Source: Ambulatory Visit | Attending: Internal Medicine | Admitting: Internal Medicine

## 2017-05-22 DIAGNOSIS — Z1231 Encounter for screening mammogram for malignant neoplasm of breast: Secondary | ICD-10-CM

## 2017-11-30 DIAGNOSIS — H524 Presbyopia: Secondary | ICD-10-CM | POA: Diagnosis not present

## 2017-11-30 DIAGNOSIS — H2513 Age-related nuclear cataract, bilateral: Secondary | ICD-10-CM | POA: Diagnosis not present

## 2017-11-30 DIAGNOSIS — H40019 Open angle with borderline findings, low risk, unspecified eye: Secondary | ICD-10-CM | POA: Diagnosis not present

## 2017-11-30 DIAGNOSIS — E119 Type 2 diabetes mellitus without complications: Secondary | ICD-10-CM | POA: Diagnosis not present

## 2017-11-30 DIAGNOSIS — H53002 Unspecified amblyopia, left eye: Secondary | ICD-10-CM | POA: Diagnosis not present

## 2017-11-30 DIAGNOSIS — H40013 Open angle with borderline findings, low risk, bilateral: Secondary | ICD-10-CM | POA: Diagnosis not present

## 2017-12-06 DIAGNOSIS — H2511 Age-related nuclear cataract, right eye: Secondary | ICD-10-CM | POA: Diagnosis not present

## 2017-12-06 DIAGNOSIS — H2512 Age-related nuclear cataract, left eye: Secondary | ICD-10-CM | POA: Diagnosis not present

## 2017-12-13 DIAGNOSIS — H2512 Age-related nuclear cataract, left eye: Secondary | ICD-10-CM | POA: Diagnosis not present

## 2017-12-18 DIAGNOSIS — N390 Urinary tract infection, site not specified: Secondary | ICD-10-CM | POA: Diagnosis not present

## 2017-12-18 DIAGNOSIS — Z779 Other contact with and (suspected) exposures hazardous to health: Secondary | ICD-10-CM | POA: Diagnosis not present

## 2017-12-18 DIAGNOSIS — Z6825 Body mass index (BMI) 25.0-25.9, adult: Secondary | ICD-10-CM | POA: Diagnosis not present

## 2018-01-12 DIAGNOSIS — E7849 Other hyperlipidemia: Secondary | ICD-10-CM | POA: Diagnosis not present

## 2018-01-12 DIAGNOSIS — I10 Essential (primary) hypertension: Secondary | ICD-10-CM | POA: Diagnosis not present

## 2018-01-12 DIAGNOSIS — R82998 Other abnormal findings in urine: Secondary | ICD-10-CM | POA: Diagnosis not present

## 2018-01-12 DIAGNOSIS — E1129 Type 2 diabetes mellitus with other diabetic kidney complication: Secondary | ICD-10-CM | POA: Diagnosis not present

## 2018-01-18 DIAGNOSIS — I10 Essential (primary) hypertension: Secondary | ICD-10-CM | POA: Diagnosis not present

## 2018-01-18 DIAGNOSIS — Z0000 Encounter for general adult medical examination without abnormal findings: Secondary | ICD-10-CM | POA: Diagnosis not present

## 2018-01-18 DIAGNOSIS — I129 Hypertensive chronic kidney disease with stage 1 through stage 4 chronic kidney disease, or unspecified chronic kidney disease: Secondary | ICD-10-CM | POA: Diagnosis not present

## 2018-01-18 DIAGNOSIS — R600 Localized edema: Secondary | ICD-10-CM | POA: Diagnosis not present

## 2018-01-18 DIAGNOSIS — N182 Chronic kidney disease, stage 2 (mild): Secondary | ICD-10-CM | POA: Diagnosis not present

## 2018-01-18 DIAGNOSIS — E1129 Type 2 diabetes mellitus with other diabetic kidney complication: Secondary | ICD-10-CM | POA: Diagnosis not present

## 2018-01-18 DIAGNOSIS — Z1212 Encounter for screening for malignant neoplasm of rectum: Secondary | ICD-10-CM | POA: Diagnosis not present

## 2018-01-18 DIAGNOSIS — E7849 Other hyperlipidemia: Secondary | ICD-10-CM | POA: Diagnosis not present

## 2018-01-18 DIAGNOSIS — Z1389 Encounter for screening for other disorder: Secondary | ICD-10-CM | POA: Diagnosis not present

## 2018-01-18 DIAGNOSIS — I517 Cardiomegaly: Secondary | ICD-10-CM | POA: Diagnosis not present

## 2018-01-18 DIAGNOSIS — Z6823 Body mass index (BMI) 23.0-23.9, adult: Secondary | ICD-10-CM | POA: Diagnosis not present

## 2018-02-26 DIAGNOSIS — R69 Illness, unspecified: Secondary | ICD-10-CM | POA: Diagnosis not present

## 2018-03-05 DIAGNOSIS — R69 Illness, unspecified: Secondary | ICD-10-CM | POA: Diagnosis not present

## 2018-03-17 DIAGNOSIS — R69 Illness, unspecified: Secondary | ICD-10-CM | POA: Diagnosis not present

## 2018-03-20 DIAGNOSIS — R69 Illness, unspecified: Secondary | ICD-10-CM | POA: Diagnosis not present

## 2018-04-10 DIAGNOSIS — Z961 Presence of intraocular lens: Secondary | ICD-10-CM | POA: Diagnosis not present

## 2018-04-12 ENCOUNTER — Other Ambulatory Visit: Payer: Self-pay | Admitting: Internal Medicine

## 2018-04-12 DIAGNOSIS — Z1231 Encounter for screening mammogram for malignant neoplasm of breast: Secondary | ICD-10-CM

## 2018-05-02 NOTE — Progress Notes (Addendum)
[facility] - Clinic Note  05/03/2018     CHIEF COMPLAINT Patient presents for Diabetic Eye Exam and Eye Problem   HISTORY OF PRESENT ILLNESS: Monica Hamilton is a 77 y.o. female who presents to the clinic today for:   HPI    Diabetic Eye Exam    Vision is stable.  Associated Symptoms Pain.  Negative for Flashes, Distortion, Photophobia, Trauma, Jaw Claudication, Fever, Fatigue, Weight Loss, Shoulder/Hip pain, Glare, Scalp Tenderness, Redness, Blind Spot and Floaters.  Diabetes characteristics include Type 2.  This started 20 years ago.  Blood sugar level is controlled.  Last Blood Glucose 170.  Last A1C 6.9.  I, the attending physician,  performed the HPI with the patient and updated documentation appropriately.          Comments    Pt presents for eye problem and DM exam; Patient states her eyes have been burning, especially when she wakes up in the am "they feel as if they are on fire, then later in the day they are sore". Pt uses Systane without relief. Pt reports last BS 170 (2wk ago).. Bs have been stable A1C 6.9(2 mos ago); Pt reports she has strabismus sx as a child; Pt states OS has "always been lazy";        Last edited by Rennis ChrisZamora, Irini Leet, MD on 05/03/2018  8:34 AM. (History)      Referring physician: Geoffry ParadiseAronson, Richard, MD [address] [place], [place] [address]  HISTORICAL INFORMATION:   Selected notes from the MEDICAL RECORD NUMBER Self referral LEE:  Ocular Hx: pseudophakia OU [name](Shapiro 2019) PMH: DM (on metformin), HTN, arthritis,    CURRENT MEDICATIONS: No current outpatient medications on file. (Ophthalmic Drugs)   No current facility-administered medications for this visit.  (Ophthalmic Drugs)   Current Outpatient Medications (Other)  Medication Sig  . amLODipine (NORVASC) 5 MG tablet Take 5 mg by mouth daily.  Marland Kitchen. CINNAMON PO Take 1,000 mg by mouth daily.  Marland Kitchen. losartan-hydrochlorothiazide (HYZAAR) 100-25 MG per tablet Take 1 tablet by mouth  daily.  . metFORMIN (GLUCOPHAGE) 500 MG tablet Take 500 mg by mouth 2 (two) times daily.  . metoprolol succinate (TOPROL-XL) 50 MG 24 hr tablet Take 50 mg by mouth daily. Take with or immediately following a meal.   No current facility-administered medications for this visit.  (Other)      REVIEW OF SYSTEMS: ROS    Positive for: Endocrine, Eyes   Negative for: Constitutional, Gastrointestinal, Neurological, Skin, Genitourinary, Musculoskeletal, HENT, Cardiovascular, Respiratory, Psychiatric, Allergic/Imm, Heme/Lymph   Last edited by Eldridge ScotKendrick, Glenda, LPN on 6/04/54096/13/2019  8:14 AM. (History)       ALLERGIES Allergies  Allergen Reactions  . Ace Inhibitors Cough    PAST MEDICAL HISTORY Past Medical History:  Diagnosis Date  . Arthritis   . Colon polyp    adenomarous  . Diabetes mellitus without complication (HCC)   . Diverticulosis   . Hepatomegaly   . Hyperlipidemia   . Hypertension   . Left ventricular hypertrophy   . Mitral valve prolapse   . Perforated ulcer (HCC) 2011  . Pneumonia    Past Surgical History:  Procedure Laterality Date  . ABDOMINAL HYSTERECTOMY    . APPENDECTOMY    . BLADDER SUSPENSION    . BREAST BIOPSY Left   . BREAST EXCISIONAL BIOPSY Left   . CHOLECYSTECTOMY    . DILATION AND CURETTAGE OF UTERUS    . HEAD & NECK SKIN LESION EXCISIONAL BIOPSY  top left scalp  . SHOULDER ARTHROSCOPY WITH SUBACROMIAL DECOMPRESSION Right 01/30/2013   Procedure: SHOULDER ARTHROSCOPY WITH SUBACROMIAL DECOMPRESSION;  Surgeon: Drucilla Schmidt, MD;  Location: [facility];  Service: Orthopedics;  Laterality: Right;  with labral debridement    FAMILY HISTORY Family History  Problem Relation Age of Onset  . Heart disease Father   . Breast cancer Maternal Aunt   . Diabetes Brother   . Leukemia Sister   . Colon cancer Neg Hx   . Esophageal cancer Neg Hx   . Rectal cancer Neg Hx   . Stomach cancer Neg Hx     SOCIAL HISTORY Social History   Tobacco Use  . Smoking  status: Never Smoker  . Smokeless tobacco: Never Used  Substance Use Topics  . Alcohol use: No  . Drug use: No         OPHTHALMIC EXAM:  Base Eye Exam    Visual Acuity (Snellen - Linear)      Right Left   Dist [place] 20/25 20/300   Dist ph [place] 20/20 NI       Tonometry (Tonopen, 8:21 AM)      Right Left   Pressure 15 15       Pupils      Dark Light Shape React APD   Right 3 2 Round Brisk None   Left 3 2 Round Brisk None       Visual Fields (Counting fingers)      Left Right    Full Full       Extraocular Movement      Right Left    Full, Ortho Full, Ortho       Neuro/Psych    Oriented x3:  Yes   Mood/Affect:  Normal       Dilation    Both eyes:  1.0% Mydriacyl, 2.5% Phenylephrine @ 8:21 AM        Slit Lamp and Fundus Exam    Slit Lamp Exam      Right Left   Lids/Lashes Dermatochalasis - upper lid, Meibomian gland dysfunction Dermatochalasis - upper lid, Meibomian gland dysfunction   Conjunctiva/Sclera Nasal Pinguecula Nasal Pinguecula   Cornea 1+ Punctate epithelial erosions, Arcus, Temporal Well healed cataract wounds 1-2+ Punctate epithelial erosions, Arcus, Temporal Well healed cataract wounds   Anterior Chamber Deep and quiet Deep and quiet   Iris Round and well dilated Round and dilated   Lens PC IOL in good position, vertical PC folds PC IOL in good position, trace Posterior capsular opacification   Vitreous Vitreous syneresis Vitreous syneresis, Posterior vitreous detachment       Fundus Exam      Right Left   Disc Pink and Sharp Pink and Sharp, Superior-temporal Peripapillary atrophy   C/D Ratio 0.4 0.4   Macula Flat, Blunted foveal reflex, RPE mottling and clumping, Drusen, No heme or edema Blunted foveal reflex, RPE mottling and clumping, Drusen, No heme or edema   Vessels Mild Vascular attenuation Mild Vascular attenuation   Periphery Attached, scattered Reticular degeneration Attached, scattered Reticular degeneration        Refraction     Wearing Rx   OTC readers       Manifest Refraction      Sphere Cylinder Axis Dist [place]   Right [place] +0.50 175 20/20   Left [place]   20/300 (NI)          IMAGING AND PROCEDURES  Imaging and Procedures for @TODAY @  OCT, Retina - OU -  Both Eyes       Right Eye Quality was good. Central Foveal Thickness: 295. Progression has no prior data. Findings include normal foveal contour, no IRF, no SRF, retinal drusen .   Left Eye Quality was good. Central Foveal Thickness: 296. Progression has no prior data. Findings include no IRF, abnormal foveal contour, no SRF, retinal drusen , pigment epithelial detachment (Lamellar fovea defect).   Notes *Images captured and stored on drive  Diagnosis / Impression: Intermediate Non-Exudative ARMD OU  Clinical management:  See below  Abbreviations: NFP - Normal foveal profile. CME - cystoid macular edema. PED - pigment epithelial detachment. IRF - intraretinal fluid. SRF - subretinal fluid. EZ - ellipsoid zone. ERM - epiretinal membrane. ORA - outer retinal atrophy. ORT - outer retinal tubulation. SRHM - subretinal hyper-reflective material                  ASSESSMENT/PLAN:    ICD-10-CM   1. 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 Amblyopia of left eye H53.002   4. Posterior vitreous detachment of left eye H43.812   5. Retinal edema H35.81 OCT, Retina - OU - Both Eyes  6. Pseudophakia of both eyes Z96.1   7. Dry eyes H04.123     1. Intermediate Age related macular degeneration, non-exudative, both eyes  - The incidence, anatomy, and pathology of dry AMD, risk of progression, and the AREDS and AREDS 2 study including smoking risks discussed with patient.  - Recommend amsler grid monitoring  - f/u 6 months  2. Diabetes mellitus, type 2 without retinopathy - The incidence, risk factors for progression, natural history  and treatment options for diabetic retinopathy  were discussed with patient.   - The need for close monitoring of blood glucose, blood pressure, and serum lipids, avoiding cigarette or any type of tobacco, and the need for long term follow up was also discussed with patient. - f/u in 1 year, sooner prn  3. Severe amblyopia OS - history of patching as a child -- no strabismus surgery - VA subjectively stable OS -- 20/300  4. PVD / vitreous syneresis OS  Discussed findings and prognosis  No RT or RD on 360 scleral depressed exam  Reviewed s/s of RT/RD  Strict return precautions for any such RT/RD signs/symptoms  5. No retinal edema on exam or OCT  6. Pseudophakia OU  - s/p CE/IOL OU by [name]  - beautiful surgeries, doing well  - monitor  7. Dry eyes OU - significant PEE on slit lamp exam - likely main cause of chief complaint of burning upon waking - recommend artificial tears QID and lubricating ointment qhs as needed    Ophthalmic Meds Ordered this visit:  No orders of the defined types were placed in this encounter.      Return in about 6 months (around 11/02/2018) for F/U Non- Exu AMD, DFE, OCT.  There are no Patient Instructions on file for this visit.   Explained the diagnoses, plan, and follow up with the patient and they expressed understanding.  Patient expressed understanding of the importance of proper follow up care.   This document serves as a record of services personally performed by Karie Chimera, MD, PhD. It was created on their behalf by Virgilio Belling, COA, a certified ophthalmic assistant. The creation of this record is the provider's dictation and/or activities during the visit.  Electronically signed  by: Virgilio Belling, COA  06.12.19 12:25 AM   Karie Chimera, M.D., Ph.D. Diseases & Surgery of the Retina and Vitreous [facility]  I have reviewed the above documentation for accuracy and completeness, and I agree with  the above. Karie Chimera, M.D., Ph.D. 05/07/18 12:25 AM    Abbreviations: M myopia (nearsighted); A astigmatism; H hyperopia (farsighted); P presbyopia; Mrx spectacle prescription;  CTL contact lenses; OD right eye; OS left eye; OU both eyes  XT exotropia; ET esotropia; PEK punctate epithelial keratitis; PEE punctate epithelial erosions; DES dry eye syndrome; MGD meibomian gland dysfunction; ATs artificial tears; PFAT's preservative free artificial tears; NSC nuclear sclerotic cataract; PSC posterior subcapsular cataract; ERM epi-retinal membrane; PVD posterior vitreous detachment; RD retinal detachment; DM diabetes mellitus; DR diabetic retinopathy; NPDR non-proliferative diabetic retinopathy; PDR proliferative diabetic retinopathy; CSME clinically significant macular edema; DME diabetic macular edema; dbh dot blot hemorrhages; CWS cotton wool spot; POAG primary open angle glaucoma; C/D cup-to-disc ratio; HVF humphrey visual field; GVF goldmann visual field; OCT optical coherence tomography; IOP intraocular pressure; BRVO Branch retinal vein occlusion; CRVO central retinal vein occlusion; CRAO central retinal artery occlusion; BRAO branch retinal artery occlusion; RT retinal tear; SB scleral buckle; PPV pars plana vitrectomy; VH Vitreous hemorrhage; PRP panretinal laser photocoagulation; IVK intravitreal kenalog; VMT vitreomacular traction; MH Macular hole;  NVD neovascularization of the disc; NVE neovascularization elsewhere; AREDS age related eye disease study; ARMD age related macular degeneration; POAG primary open angle glaucoma; EBMD epithelial/anterior basement membrane dystrophy; ACIOL anterior chamber intraocular lens; IOL intraocular lens; PCIOL posterior chamber intraocular lens; Phaco/IOL phacoemulsification with intraocular lens placement; PRK photorefractive keratectomy; LASIK laser assisted in situ keratomileusis; HTN hypertension; DM diabetes mellitus; COPD chronic obstructive pulmonary  disease

## 2018-05-03 ENCOUNTER — Encounter (INDEPENDENT_AMBULATORY_CARE_PROVIDER_SITE_OTHER): Payer: Self-pay | Admitting: Ophthalmology

## 2018-05-03 ENCOUNTER — Ambulatory Visit (INDEPENDENT_AMBULATORY_CARE_PROVIDER_SITE_OTHER): Payer: Medicare HMO | Admitting: Ophthalmology

## 2018-05-03 DIAGNOSIS — H353132 Nonexudative age-related macular degeneration, bilateral, intermediate dry stage: Secondary | ICD-10-CM | POA: Diagnosis not present

## 2018-05-03 DIAGNOSIS — H43812 Vitreous degeneration, left eye: Secondary | ICD-10-CM | POA: Diagnosis not present

## 2018-05-03 DIAGNOSIS — H3581 Retinal edema: Secondary | ICD-10-CM

## 2018-05-03 DIAGNOSIS — Z961 Presence of intraocular lens: Secondary | ICD-10-CM

## 2018-05-03 DIAGNOSIS — H53002 Unspecified amblyopia, left eye: Secondary | ICD-10-CM | POA: Diagnosis not present

## 2018-05-03 DIAGNOSIS — E119 Type 2 diabetes mellitus without complications: Secondary | ICD-10-CM | POA: Diagnosis not present

## 2018-05-03 DIAGNOSIS — H04123 Dry eye syndrome of bilateral lacrimal glands: Secondary | ICD-10-CM | POA: Diagnosis not present

## 2018-05-23 ENCOUNTER — Ambulatory Visit: Payer: Medicare Other

## 2018-05-23 ENCOUNTER — Ambulatory Visit
Admission: RE | Admit: 2018-05-23 | Discharge: 2018-05-23 | Disposition: A | Discharge status: Home / Self Care | Payer: Medicare HMO | Source: Ambulatory Visit | Attending: Internal Medicine | Admitting: Internal Medicine

## 2018-05-23 DIAGNOSIS — Z1231 Encounter for screening mammogram for malignant neoplasm of breast: Secondary | ICD-10-CM | POA: Diagnosis not present

## 2018-06-07 NOTE — Progress Notes (Signed)
[facility] - Clinic Note  06/08/2018     CHIEF COMPLAINT Patient presents for Retina Follow Up   HISTORY OF PRESENT ILLNESS: Monica Hamilton is a 77 y.o. female who presents to the clinic today for:   HPI    Retina Follow Up    Patient presents with  Dry AMD.  In both eyes.  This started 1 month ago.  Severity is mild.  Since onset it is gradually worsening.  I, the attending physician,  performed the HPI with the patient and updated documentation appropriately.          Comments    F/U NONEXU/DM. Patient states she started seeing a straight  line  on the outside of OD and cluster of circles when she is  outside. Pt occasionally has burning OD. Pt does not monitor BS but are WINL per patient.. Pt is using gtt's as directed.       Last edited by Rennis ChrisZamora, Anupama Piehl, MD on 06/08/2018  8:26 AM. (History)      Referring physician: Geoffry ParadiseAronson, Richard, MD [address] [place], [place] [address]  HISTORICAL INFORMATION:   Selected notes from the MEDICAL RECORD NUMBER Self referral LEE:  Ocular Hx: pseudophakia OU [name](Shapiro 2019) PMH: DM (on metformin), HTN, arthritis,    CURRENT MEDICATIONS: No current outpatient medications on file. (Ophthalmic Drugs)   No current facility-administered medications for this visit.  (Ophthalmic Drugs)   Current Outpatient Medications (Other)  Medication Sig  . amLODipine (NORVASC) 5 MG tablet Take 5 mg by mouth daily.  Marland Kitchen. CINNAMON PO Take 1,000 mg by mouth daily.  Marland Kitchen. losartan-hydrochlorothiazide (HYZAAR) 100-25 MG per tablet Take 1 tablet by mouth daily.  . metFORMIN (GLUCOPHAGE) 500 MG tablet Take 500 mg by mouth 2 (two) times daily.  . metoprolol succinate (TOPROL-XL) 50 MG 24 hr tablet Take 50 mg by mouth daily. Take with or immediately following a meal.  . Multiple Vitamins-Minerals (ICAPS AREDS 2) CAPS Take by mouth.   No current facility-administered medications for this visit.  (Other)      REVIEW OF SYSTEMS: ROS     Positive for: Endocrine, Eyes   Negative for: Constitutional, Gastrointestinal, Neurological, Skin, Genitourinary, Musculoskeletal, HENT, Cardiovascular, Respiratory, Psychiatric, Allergic/Imm, Heme/Lymph   Last edited by Eldridge ScotKendrick, Glenda, LPN on 6/04/54097/19/2019  7:59 AM. (History)       ALLERGIES Allergies  Allergen Reactions  . Ace Inhibitors Cough    PAST MEDICAL HISTORY Past Medical History:  Diagnosis Date  . Arthritis   . Colon polyp    adenomarous  . Diabetes mellitus without complication (HCC)   . Diverticulosis   . Hepatomegaly   . Hyperlipidemia   . Hypertension   . Left ventricular hypertrophy   . Mitral valve prolapse   . Perforated ulcer (HCC) 2011  . Pneumonia    Past Surgical History:  Procedure Laterality Date  . ABDOMINAL HYSTERECTOMY    . APPENDECTOMY    . BLADDER SUSPENSION    . BREAST BIOPSY Left   . BREAST EXCISIONAL BIOPSY Left   . CHOLECYSTECTOMY    . DILATION AND CURETTAGE OF UTERUS    . HEAD & NECK SKIN LESION EXCISIONAL BIOPSY     top left scalp  . SHOULDER ARTHROSCOPY WITH SUBACROMIAL DECOMPRESSION Right 01/30/2013   Procedure: SHOULDER ARTHROSCOPY WITH SUBACROMIAL DECOMPRESSION;  Surgeon: Drucilla SchmidtJames Hamilton Aplington, MD;  Location: [facility];  Service: Orthopedics;  Laterality: Right;  with labral debridement    FAMILY HISTORY Family History  Problem Relation Age of Onset  . Heart disease Father   . Breast cancer Maternal Aunt   . Diabetes Brother   . Leukemia Sister   . Colon cancer Neg Hx   . Esophageal cancer Neg Hx   . Rectal cancer Neg Hx   . Stomach cancer Neg Hx     SOCIAL HISTORY Social History   Tobacco Use  . Smoking status: Never Smoker  . Smokeless tobacco: Never Used  Substance Use Topics  . Alcohol use: No  . Drug use: No         OPHTHALMIC EXAM:  Base Eye Exam    Visual Acuity (Snellen - Linear)      Right Left   Dist [place] 20/40 20/400   Dist ph [place] NI NI       Tonometry (Tonopen, 8:11 AM)      Right Left   Pressure  18 18       Pupils      Dark Light Shape React APD   Right 4 3 Round Brisk None   Left 4 3 Round Brisk None       Visual Fields (Counting fingers)      Left Right    Full Full       Extraocular Movement      Right Left    Full, Ortho Full, Ortho       Neuro/Psych    Oriented x3:  Yes   Mood/Affect:  Normal       Dilation    Both eyes:  1.0% Mydriacyl, 2.5% Phenylephrine @ 8:12 AM        Slit Lamp and Fundus Exam    Slit Lamp Exam      Right Left   Lids/Lashes Dermatochalasis - upper lid, Meibomian gland dysfunction Dermatochalasis - upper lid, Meibomian gland dysfunction   Conjunctiva/Sclera Nasal Pinguecula Nasal Pinguecula   Cornea 1-2+ diffuse Punctate epithelial erosions, Arcus, Temporal Well healed cataract wounds, Debris in tear film 1-2+ Punctate epithelial erosions, Arcus, Temporal Well healed cataract wounds, Debris in tear film   Anterior Chamber Deep and quiet Deep and quiet   Iris Round and moderately dilated Round and dilated   Lens PC IOL in good position, vertical PC folds PC IOL in good position, trace Posterior capsular opacification   Vitreous Vitreous syneresis Vitreous syneresis, Posterior vitreous detachment       Fundus Exam      Right Left   Disc Pink and Sharp Pink and Sharp, Superior-temporal Peripapillary atrophy   C/D Ratio 0.4 0.4   Macula Flat, Blunted foveal reflex, RPE mottling and clumping, Drusen, No heme or edema Blunted foveal reflex, RPE mottling and clumping, Drusen, No heme or edema   Vessels Mild Vascular attenuation Mild Vascular attenuation   Periphery Attached, scattered mild Reticular degeneration Attached, scattered Reticular degeneration          IMAGING AND PROCEDURES  Imaging and Procedures for @TODAY @  OCT, Retina - OU - Both Eyes       Right Eye Quality was good. Central Foveal Thickness: 293. Progression has been stable. Findings include normal foveal contour, no IRF, no SRF, retinal drusen  (Mild  progression of drusen).   Left Eye Quality was good. Central Foveal Thickness: 298. Progression has been stable. Findings include no IRF, abnormal foveal contour, no SRF, retinal drusen , pigment epithelial detachment (Lamellar fovea defect).   Notes *Images captured and stored on drive  Diagnosis / Impression: Intermediate Non-Exudative ARMD OU  Clinical management:  See below  Abbreviations: NFP - Normal foveal profile. CME - cystoid macular edema. PED - pigment epithelial detachment. IRF - intraretinal fluid. SRF - subretinal fluid. EZ - ellipsoid zone. ERM - epiretinal membrane. ORA - outer retinal atrophy. ORT - outer retinal tubulation. SRHM - subretinal hyper-reflective material                  ASSESSMENT/PLAN:    ICD-10-CM   1. Intermediate stage nonexudative age-related macular degeneration of both eyes H35.3132 OCT, Retina - OU - Both Eyes  2. 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 Amblyopia of left eye H53.002   4. Posterior vitreous detachment of left eye H43.812   5. Retinal edema H35.81 OCT, Retina - OU - Both Eyes  6. Pseudophakia of both eyes Z96.1   7. Dry eyes H04.123   8. Subjective visual disturbance H53.10     1. Intermediate Age related macular degeneration, non-exudative, both eyes  - The incidence, anatomy, and pathology of dry AMD, risk of progression, and the AREDS and AREDS 2 study including smoking risks discussed with patient.  - continue amsler grid monitoring   - f/u 6 months as scheduled   2. Diabetes mellitus, type 2 without retinopathy - The incidence, risk factors for progression, natural history and treatment options for diabetic retinopathy  were discussed with patient.   - The need for close monitoring of blood glucose, blood pressure, and serum lipids, avoiding cigarette or any type of tobacco, and the need for long term follow up was also discussed with patient.  - f/u in 1 year, sooner prn  3. Severe  amblyopia OS - history of patching as a child -- no strabismus surgery - VA subjectively stable OS -- 20/400  4. PVD / vitreous syneresis OS  Discussed findings and prognosis  No RT or RD on 360 scleral depressed exam  Reviewed s/s of RT/RD  Strict return precautions for any such RT/RD signs/symptoms  5. No retinal edema on exam or OCT  6. Pseudophakia OU  - s/Hamilton CE/IOL OU by [name]  - beautiful surgeries, doing well  - monitor  7. Dry eyes OU - improving - improved PEE on slit lamp exam - likely main cause of chief complaint of burning upon waking - cont artificial tears QID and lubricating ointment qhs as needed  8. Visual disturbance - 1 day history of yellow/gold floaters and spots in vision - symptoms improving - mild dryness - no posterior pathology - RT/RD - to correspond with symptoms - monitor   Ophthalmic Meds Ordered this visit:  No orders of the defined types were placed in this encounter.      Return for F/U as scheduled.  There are no Patient Instructions on file for this visit.   Explained the diagnoses, plan, and follow up with the patient and they expressed understanding.  Patient expressed understanding of the importance of proper follow up care.   This document serves as a record of services personally performed by Karie Chimera, MD, PhD. It was created on their behalf by Virgilio Belling, COA, a certified ophthalmic assistant. The creation of this record is the provider's dictation and/or activities during the visit.  Electronically signed by: Virgilio Belling, COA  07.18.19 8:55 AM   Karie Chimera, M.D., Ph.D. Diseases & Surgery of the Retina and Vitreous [facility]  I have reviewed the above documentation for accuracy and completeness, and I  agree with the above. Karie Chimera, M.D., Ph.D. 06/08/18 8:55 AM    Abbreviations: M myopia (nearsighted); A astigmatism; H hyperopia (farsighted); Hamilton presbyopia; Mrx  spectacle prescription;  CTL contact lenses; OD right eye; OS left eye; OU both eyes  XT exotropia; ET esotropia; PEK punctate epithelial keratitis; PEE punctate epithelial erosions; DES dry eye syndrome; MGD meibomian gland dysfunction; ATs artificial tears; PFAT's preservative free artificial tears; NSC nuclear sclerotic cataract; PSC posterior subcapsular cataract; ERM epi-retinal membrane; PVD posterior vitreous detachment; RD retinal detachment; DM diabetes mellitus; DR diabetic retinopathy; NPDR non-proliferative diabetic retinopathy; PDR proliferative diabetic retinopathy; CSME clinically significant macular edema; DME diabetic macular edema; dbh dot blot hemorrhages; CWS cotton wool spot; POAG primary open angle glaucoma; C/D cup-to-disc ratio; HVF humphrey visual field; GVF goldmann visual field; OCT optical coherence tomography; IOP intraocular pressure; BRVO Branch retinal vein occlusion; CRVO central retinal vein occlusion; CRAO central retinal artery occlusion; BRAO branch retinal artery occlusion; RT retinal tear; SB scleral buckle; PPV pars plana vitrectomy; VH Vitreous hemorrhage; PRP panretinal laser photocoagulation; IVK intravitreal kenalog; VMT vitreomacular traction; MH Macular hole;  NVD neovascularization of the disc; NVE neovascularization elsewhere; AREDS age related eye disease study; ARMD age related macular degeneration; POAG primary open angle glaucoma; EBMD epithelial/anterior basement membrane dystrophy; ACIOL anterior chamber intraocular lens; IOL intraocular lens; PCIOL posterior chamber intraocular lens; Phaco/IOL phacoemulsification with intraocular lens placement; PRK photorefractive keratectomy; LASIK laser assisted in situ keratomileusis; HTN hypertension; DM diabetes mellitus; COPD chronic obstructive pulmonary disease

## 2018-06-08 ENCOUNTER — Encounter (INDEPENDENT_AMBULATORY_CARE_PROVIDER_SITE_OTHER): Payer: Self-pay | Admitting: Ophthalmology

## 2018-06-08 ENCOUNTER — Ambulatory Visit (INDEPENDENT_AMBULATORY_CARE_PROVIDER_SITE_OTHER): Payer: Medicare HMO | Admitting: Ophthalmology

## 2018-06-08 DIAGNOSIS — H53002 Unspecified amblyopia, left eye: Secondary | ICD-10-CM

## 2018-06-08 DIAGNOSIS — H353132 Nonexudative age-related macular degeneration, bilateral, intermediate dry stage: Secondary | ICD-10-CM | POA: Diagnosis not present

## 2018-06-08 DIAGNOSIS — H3581 Retinal edema: Secondary | ICD-10-CM | POA: Diagnosis not present

## 2018-06-08 DIAGNOSIS — Z961 Presence of intraocular lens: Secondary | ICD-10-CM | POA: Diagnosis not present

## 2018-06-08 DIAGNOSIS — H5310 Unspecified subjective visual disturbances: Secondary | ICD-10-CM | POA: Diagnosis not present

## 2018-06-08 DIAGNOSIS — E119 Type 2 diabetes mellitus without complications: Secondary | ICD-10-CM | POA: Diagnosis not present

## 2018-06-08 DIAGNOSIS — H43812 Vitreous degeneration, left eye: Secondary | ICD-10-CM | POA: Diagnosis not present

## 2018-06-08 DIAGNOSIS — H04123 Dry eye syndrome of bilateral lacrimal glands: Secondary | ICD-10-CM | POA: Diagnosis not present

## 2018-06-25 DIAGNOSIS — N952 Postmenopausal atrophic vaginitis: Secondary | ICD-10-CM | POA: Diagnosis not present

## 2018-06-25 DIAGNOSIS — Z779 Other contact with and (suspected) exposures hazardous to health: Secondary | ICD-10-CM | POA: Diagnosis not present

## 2018-06-25 DIAGNOSIS — R87612 Low grade squamous intraepithelial lesion on cytologic smear of cervix (LGSIL): Secondary | ICD-10-CM | POA: Diagnosis not present

## 2018-07-30 DIAGNOSIS — Z6823 Body mass index (BMI) 23.0-23.9, adult: Secondary | ICD-10-CM | POA: Diagnosis not present

## 2018-07-30 DIAGNOSIS — I129 Hypertensive chronic kidney disease with stage 1 through stage 4 chronic kidney disease, or unspecified chronic kidney disease: Secondary | ICD-10-CM | POA: Diagnosis not present

## 2018-07-30 DIAGNOSIS — Z23 Encounter for immunization: Secondary | ICD-10-CM | POA: Diagnosis not present

## 2018-07-30 DIAGNOSIS — E1129 Type 2 diabetes mellitus with other diabetic kidney complication: Secondary | ICD-10-CM | POA: Diagnosis not present

## 2018-07-30 DIAGNOSIS — E7849 Other hyperlipidemia: Secondary | ICD-10-CM | POA: Diagnosis not present

## 2018-07-30 DIAGNOSIS — R600 Localized edema: Secondary | ICD-10-CM | POA: Diagnosis not present

## 2018-07-30 DIAGNOSIS — N182 Chronic kidney disease, stage 2 (mild): Secondary | ICD-10-CM | POA: Diagnosis not present

## 2018-07-30 DIAGNOSIS — I10 Essential (primary) hypertension: Secondary | ICD-10-CM | POA: Diagnosis not present

## 2018-07-30 DIAGNOSIS — I517 Cardiomegaly: Secondary | ICD-10-CM | POA: Diagnosis not present

## 2018-10-30 NOTE — Progress Notes (Signed)
[facility] - Clinic Note  11/01/2018     CHIEF COMPLAINT Patient presents for Retina Follow Up   HISTORY OF PRESENT ILLNESS: Monica Hamilton is a 77 y.o. female who presents to the clinic today for:   HPI    Retina Follow Up    Patient presents with  Dry AMD.  In both eyes.  Severity is moderate.  Duration of 5 months.  Since onset it is stable.  I, the attending physician,  performed the HPI with the patient and updated documentation appropriately.          Comments    Patient states vision the same OU. Last A1c less than 7 around 4 months ago.        Last edited by Rennis Chris, MD on 11/01/2018  8:22 AM. (History)    pt states she does not think her vision has changed any since last visit, she states she is using over the counter readers, pt states she is still taking AREDS vits every day  Referring physician:   HISTORICAL INFORMATION:   Selected notes from the MEDICAL RECORD NUMBER Self referral LEE:  Ocular Hx: pseudophakia OU [name] 2019) PMH: DM (on metformin), HTN, arthritis,    CURRENT MEDICATIONS: No current outpatient medications on file. (Ophthalmic Drugs)   No current facility-administered medications for this visit.  (Ophthalmic Drugs)   Current Outpatient Medications (Other)  Medication Sig  . amLODipine (NORVASC) 5 MG tablet Take 5 mg by mouth daily.  Marland Kitchen CINNAMON PO Take 1,000 mg by mouth daily.  Marland Kitchen losartan-hydrochlorothiazide (HYZAAR) 100-25 MG per tablet Take 1 tablet by mouth daily.  . metFORMIN (GLUCOPHAGE) 500 MG tablet Take 500 mg by mouth 2 (two) times daily.  . metoprolol succinate (TOPROL-XL) 50 MG 24 hr tablet Take 50 mg by mouth daily. Take with or immediately following a meal.  . Multiple Vitamins-Minerals (ICAPS AREDS 2) CAPS Take by mouth.   No current facility-administered medications for this visit.  (Other)      REVIEW OF SYSTEMS: ROS    Positive for: Endocrine, Eyes   Negative for: Constitutional,  Gastrointestinal, Neurological, Skin, Genitourinary, Musculoskeletal, HENT, Cardiovascular, Respiratory, Psychiatric, Allergic/Imm, Heme/Lymph   Last edited by Annalee Genta D on 11/01/2018  7:40 AM. (History)       ALLERGIES Allergies  Allergen Reactions  . Ace Inhibitors Cough    PAST MEDICAL HISTORY Past Medical History:  Diagnosis Date  . Arthritis   . Colon polyp    adenomarous  . Diabetes mellitus without complication (HCC)   . Diverticulosis   . Hepatomegaly   . Hyperlipidemia   . Hypertension   . Left ventricular hypertrophy   . Mitral valve prolapse   . Perforated ulcer (HCC) 2011  . Pneumonia    Past Surgical History:  Procedure Laterality Date  . ABDOMINAL HYSTERECTOMY    . APPENDECTOMY    . BLADDER SUSPENSION    . BREAST BIOPSY Left   . BREAST EXCISIONAL BIOPSY Left   . CHOLECYSTECTOMY    . DILATION AND CURETTAGE OF UTERUS    . HEAD & NECK SKIN LESION EXCISIONAL BIOPSY     top left scalp  . SHOULDER ARTHROSCOPY WITH SUBACROMIAL DECOMPRESSION Right 01/30/2013   Procedure: SHOULDER ARTHROSCOPY WITH SUBACROMIAL DECOMPRESSION;  Surgeon: Drucilla Schmidt, MD;  Location: [facility];  Service: Orthopedics;  Laterality: Right;  with labral debridement    FAMILY HISTORY Family History  Problem Relation Age of Onset  . Heart disease  Father   . Breast cancer Maternal Aunt   . Diabetes Brother   . Leukemia Sister   . Colon cancer Neg Hx   . Esophageal cancer Neg Hx   . Rectal cancer Neg Hx   . Stomach cancer Neg Hx     SOCIAL HISTORY Social History   Tobacco Use  . Smoking status: Never Smoker  . Smokeless tobacco: Never Used  Substance Use Topics  . Alcohol use: No  . Drug use: No         OPHTHALMIC EXAM:  Base Eye Exam    Visual Acuity (Snellen - Linear)      Right Left   Dist [place] 20/25 -2 20/200 -3   Dist ph  NI NI       Tonometry (Tonopen, 7:48 AM)      Right Left   Pressure 21 21       Pupils      Dark Light Shape React APD    Right 4 3 Round Slow None   Left 4 3 Round Slow None       Visual Fields (Counting fingers)      Left Right    Full Full       Extraocular Movement      Right Left    Full, Ortho Full, Ortho       Neuro/Psych    Oriented x3:  Yes   Mood/Affect:  Normal       Dilation    Both eyes:  1.0% Mydriacyl, 2.5% Phenylephrine @ 7:48 AM        Slit Lamp and Fundus Exam    Slit Lamp Exam      Right Left   Lids/Lashes Dermatochalasis - upper lid Dermatochalasis - upper lid, Meibomian gland dysfunction   Conjunctiva/Sclera Nasal Pinguecula Nasal Pinguecula   Cornea 1+ diffuse Punctate epithelial erosions, Arcus, Temporal Well healed cataract wounds, Debris in tear film 1-2+ Punctate epithelial erosions, Arcus, Temporal Well healed cataract wounds, Debris in tear film   Anterior Chamber Deep and quiet Deep and quiet   Iris Round and moderately dilated Round and dilated   Lens PC IOL in good position, vertical PC folds PC IOL in good position, trace Posterior capsular opacification   Vitreous Vitreous syneresis Vitreous syneresis, Posterior vitreous detachment       Fundus Exam      Right Left   Disc Pink and Sharp Pink and Sharp, mild temporal Peripapillary atrophy   C/D Ratio 0.4 0.4   Macula Flat, Blunted foveal reflex, RPE mottling and clumping, mild atrophy, Drusen, No heme or edema Flat, Blunted foveal reflex, RPE mottling and clumping and atrophy, Drusen, No heme or edema   Vessels Mild Vascular attenuation Mild Vascular attenuation   Periphery Attached, scattered mild Reticular degeneration Attached, scattered Reticular degeneration          IMAGING AND PROCEDURES  Imaging and Procedures for @TODAY @  OCT, Retina - OU - Both Eyes       Right Eye Quality was good. Central Foveal Thickness: 296. Progression has been stable. Findings include normal foveal contour, no IRF, no SRF, retinal drusen , outer retinal atrophy (Mild progression of drusen and surrounding ORA).    Left Eye Quality was good. Central Foveal Thickness: 297. Progression has been stable. Findings include abnormal foveal contour, no SRF, retinal drusen , pigment epithelial detachment, intraretinal fluid (Lamellar fovea defect - stable, trace cystic changes).   Notes *Images captured and stored on drive  Diagnosis /  Impression: Intermediate Non-Exudative ARMD OU - mild progression of drusen and surrounding ORA OD  Clinical management:  See below  Abbreviations: NFP - Normal foveal profile. CME - cystoid macular edema. PED - pigment epithelial detachment. IRF - intraretinal fluid. SRF - subretinal fluid. EZ - ellipsoid zone. ERM - epiretinal membrane. ORA - outer retinal atrophy. ORT - outer retinal tubulation. SRHM - subretinal hyper-reflective material                  ASSESSMENT/PLAN:    ICD-10-CM   1. 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 Amblyopia of left eye H53.002   4. Posterior vitreous detachment of left eye H43.812   5. Retinal edema H35.81 OCT, Retina - OU - Both Eyes  6. Pseudophakia of both eyes Z96.1   7. Dry eyes H04.123     1. Intermediate Age related macular degeneration, non-exudative, both eyes  - The incidence, anatomy, and pathology of dry AMD, risk of progression, and the AREDS and AREDS 2 study including smoking risks discussed with patient.  - exam/OCT today shows tr progression of drusen and surrounding ORA OD  - continue amsler grid monitoring   - f/u 6-9 months  2. Diabetes mellitus, type 2 without retinopathy - The incidence, risk factors for progression, natural history and treatment options for diabetic retinopathy  were discussed with patient.   - The need for close monitoring of blood glucose, blood pressure, and serum lipids, avoiding cigarette or any type of tobacco, and the need for long term follow up was also  discussed with patient.  - f/u in 1 year, sooner prn  3. Severe amblyopia OS - history of patching as a child -- no strabismus surgery - VA subjectively stable OS -- 20/400  4. PVD / vitreous syneresis OS  Discussed findings and prognosis  No RT or RD on 360 scleral depressed exam  Reviewed s/s of RT/RD  Strict return precautions for any such RT/RD signs/symptoms  5. No retinal edema on exam or OCT  6. Pseudophakia OU  - s/p CE/IOL OU by Nile RiggsShapiro  - beautiful surgeries, doing well  - monitor  7. Dry eyes OU - improving - improved PEE on slit lamp exam - likely main cause of chief complaint of burning upon waking - cont artificial tears QID and lubricating ointment qhs as needed   Ophthalmic Meds Ordered this visit:  No orders of the defined types were placed in this encounter.      Return for F/U 6-9 months non-exu ARMD OU, DFE, OCT.  There are no Patient Instructions on file for this visit.   Explained the diagnoses, plan, and follow up with the patient and they expressed understanding.  Patient expressed understanding of the importance of proper follow up care.   This document serves as a record of services personally performed by Karie ChimeraBrian G. Dalene Robards, MD, PhD. It was created on their behalf by Laurian BrimAmanda Brown, OA, an ophthalmic assistant. The creation of this record is the provider's dictation and/or activities during the visit.    Electronically signed by: Laurian BrimAmanda Brown, OA  12.10.19 8:42 AM    Karie ChimeraBrian G. Naira Standiford, M.D., Ph.D. Diseases & Surgery of the Retina and Vitreous [facility]  I have reviewed the above documentation for accuracy and completeness, and I agree with the above. Karie ChimeraBrian G. Ishmel Acevedo, M.D., Ph.D. 11/01/18 8:43 AM  Abbreviations: M myopia (nearsighted); A astigmatism; H hyperopia (farsighted); P presbyopia; Mrx spectacle prescription;  CTL contact lenses; OD right eye; OS left eye; OU both eyes  XT exotropia; ET esotropia; PEK  punctate epithelial keratitis; PEE punctate epithelial erosions; DES dry eye syndrome; MGD meibomian gland dysfunction; ATs artificial tears; PFAT's preservative free artificial tears; [place] nuclear sclerotic cataract; PSC posterior subcapsular cataract; ERM epi-retinal membrane; PVD posterior vitreous detachment; RD retinal detachment; DM diabetes mellitus; DR diabetic retinopathy; NPDR non-proliferative diabetic retinopathy; PDR proliferative diabetic retinopathy; CSME clinically significant macular edema; DME diabetic macular edema; dbh dot blot hemorrhages; CWS cotton wool spot; POAG primary open angle glaucoma; C/D cup-to-disc ratio; HVF humphrey visual field; GVF goldmann visual field; OCT optical coherence tomography; IOP intraocular pressure; BRVO Branch retinal vein occlusion; CRVO central retinal vein occlusion; CRAO central retinal artery occlusion; BRAO branch retinal artery occlusion; RT retinal tear; SB scleral buckle; PPV pars plana vitrectomy; VH Vitreous hemorrhage; PRP panretinal laser photocoagulation; IVK intravitreal kenalog; VMT vitreomacular traction; MH Macular hole;  NVD neovascularization of the disc; NVE neovascularization elsewhere; AREDS age related eye disease study; ARMD age related macular degeneration; POAG primary open angle glaucoma; EBMD epithelial/anterior basement membrane dystrophy; ACIOL anterior chamber intraocular lens; IOL intraocular lens; PCIOL posterior chamber intraocular lens; Phaco/IOL phacoemulsification with intraocular lens placement; [place] photorefractive keratectomy; LASIK laser assisted in situ keratomileusis; HTN hypertension; DM diabetes mellitus; COPD chronic obstructive pulmonary disease

## 2018-11-01 ENCOUNTER — Encounter (INDEPENDENT_AMBULATORY_CARE_PROVIDER_SITE_OTHER): Payer: Self-pay | Admitting: Ophthalmology

## 2018-11-01 ENCOUNTER — Ambulatory Visit (INDEPENDENT_AMBULATORY_CARE_PROVIDER_SITE_OTHER): Payer: Medicare HMO | Admitting: Ophthalmology

## 2018-11-01 DIAGNOSIS — H53002 Unspecified amblyopia, left eye: Secondary | ICD-10-CM | POA: Diagnosis not present

## 2018-11-01 DIAGNOSIS — E119 Type 2 diabetes mellitus without complications: Secondary | ICD-10-CM | POA: Diagnosis not present

## 2018-11-01 DIAGNOSIS — Z961 Presence of intraocular lens: Secondary | ICD-10-CM | POA: Diagnosis not present

## 2018-11-01 DIAGNOSIS — H43812 Vitreous degeneration, left eye: Secondary | ICD-10-CM

## 2018-11-01 DIAGNOSIS — H353132 Nonexudative age-related macular degeneration, bilateral, intermediate dry stage: Secondary | ICD-10-CM

## 2018-11-01 DIAGNOSIS — H04123 Dry eye syndrome of bilateral lacrimal glands: Secondary | ICD-10-CM

## 2018-11-01 DIAGNOSIS — H3581 Retinal edema: Secondary | ICD-10-CM

## 2019-01-16 DIAGNOSIS — R82998 Other abnormal findings in urine: Secondary | ICD-10-CM | POA: Diagnosis not present

## 2019-01-16 DIAGNOSIS — I10 Essential (primary) hypertension: Secondary | ICD-10-CM | POA: Diagnosis not present

## 2019-01-16 DIAGNOSIS — E1129 Type 2 diabetes mellitus with other diabetic kidney complication: Secondary | ICD-10-CM | POA: Diagnosis not present

## 2019-01-18 DIAGNOSIS — M545 Low back pain: Secondary | ICD-10-CM | POA: Diagnosis not present

## 2019-01-23 DIAGNOSIS — E1129 Type 2 diabetes mellitus with other diabetic kidney complication: Secondary | ICD-10-CM | POA: Diagnosis not present

## 2019-01-23 DIAGNOSIS — E1139 Type 2 diabetes mellitus with other diabetic ophthalmic complication: Secondary | ICD-10-CM | POA: Diagnosis not present

## 2019-01-23 DIAGNOSIS — I129 Hypertensive chronic kidney disease with stage 1 through stage 4 chronic kidney disease, or unspecified chronic kidney disease: Secondary | ICD-10-CM | POA: Diagnosis not present

## 2019-01-23 DIAGNOSIS — Z961 Presence of intraocular lens: Secondary | ICD-10-CM | POA: Diagnosis not present

## 2019-01-23 DIAGNOSIS — H3581 Retinal edema: Secondary | ICD-10-CM | POA: Diagnosis not present

## 2019-01-23 DIAGNOSIS — H43812 Vitreous degeneration, left eye: Secondary | ICD-10-CM | POA: Diagnosis not present

## 2019-01-23 DIAGNOSIS — Z1212 Encounter for screening for malignant neoplasm of rectum: Secondary | ICD-10-CM | POA: Diagnosis not present

## 2019-01-23 DIAGNOSIS — Z0000 Encounter for general adult medical examination without abnormal findings: Secondary | ICD-10-CM | POA: Diagnosis not present

## 2019-01-23 DIAGNOSIS — M5136 Other intervertebral disc degeneration, lumbar region: Secondary | ICD-10-CM | POA: Diagnosis not present

## 2019-01-23 DIAGNOSIS — E7849 Other hyperlipidemia: Secondary | ICD-10-CM | POA: Diagnosis not present

## 2019-01-23 DIAGNOSIS — N182 Chronic kidney disease, stage 2 (mild): Secondary | ICD-10-CM | POA: Diagnosis not present

## 2019-03-18 DIAGNOSIS — R69 Illness, unspecified: Secondary | ICD-10-CM | POA: Diagnosis not present

## 2019-04-01 ENCOUNTER — Other Ambulatory Visit: Payer: Self-pay | Admitting: Surgical

## 2019-04-01 DIAGNOSIS — M48061 Spinal stenosis, lumbar region without neurogenic claudication: Secondary | ICD-10-CM

## 2019-04-09 ENCOUNTER — Telehealth: Payer: Self-pay | Admitting: Nurse Practitioner

## 2019-04-09 NOTE — Telephone Encounter (Signed)
Phone call to patient to verify medication list and allergies for myelogram procedure. Pt aware she will not need to hold any medications for this procedure. Pre and post procedure instructions reviewed with pt. Pt verbalized understanding. 

## 2019-04-29 ENCOUNTER — Ambulatory Visit
Admission: RE | Admit: 2019-04-29 | Discharge: 2019-04-29 | Disposition: A | Discharge status: Home / Self Care | Payer: Medicare HMO | Source: Ambulatory Visit | Attending: Surgical | Admitting: Surgical

## 2019-04-29 ENCOUNTER — Other Ambulatory Visit: Payer: Self-pay

## 2019-04-29 DIAGNOSIS — M5127 Other intervertebral disc displacement, lumbosacral region: Secondary | ICD-10-CM | POA: Diagnosis not present

## 2019-04-29 DIAGNOSIS — M48061 Spinal stenosis, lumbar region without neurogenic claudication: Secondary | ICD-10-CM | POA: Diagnosis not present

## 2019-04-29 MED ORDER — DIAZEPAM 5 MG PO TABS
5.0000 mg | ORAL_TABLET | Freq: Once | ORAL | Status: AC
  Administered 2019-04-29: 5 mg via ORAL

## 2019-04-29 MED ORDER — IOPAMIDOL (ISOVUE-M 200) INJECTION 41%
18.0000 mL | Freq: Once | INTRAMUSCULAR | Status: AC
  Administered 2019-04-29: 18 mL via INTRATHECAL

## 2019-04-29 NOTE — Discharge Instructions (Signed)

## 2019-05-06 DIAGNOSIS — M545 Low back pain: Secondary | ICD-10-CM | POA: Diagnosis not present

## 2019-05-08 ENCOUNTER — Other Ambulatory Visit: Payer: Self-pay | Admitting: Internal Medicine

## 2019-05-08 DIAGNOSIS — Z1231 Encounter for screening mammogram for malignant neoplasm of breast: Secondary | ICD-10-CM

## 2019-05-28 DIAGNOSIS — M5416 Radiculopathy, lumbar region: Secondary | ICD-10-CM | POA: Diagnosis not present

## 2019-06-13 DIAGNOSIS — M545 Low back pain: Secondary | ICD-10-CM | POA: Diagnosis not present

## 2019-06-24 ENCOUNTER — Other Ambulatory Visit: Payer: Self-pay

## 2019-06-24 ENCOUNTER — Ambulatory Visit
Admission: RE | Admit: 2019-06-24 | Discharge: 2019-06-24 | Disposition: A | Discharge status: Home / Self Care | Payer: Medicare HMO | Source: Ambulatory Visit | Attending: Internal Medicine | Admitting: Internal Medicine

## 2019-06-24 DIAGNOSIS — Z1231 Encounter for screening mammogram for malignant neoplasm of breast: Secondary | ICD-10-CM | POA: Diagnosis not present

## 2019-07-09 DIAGNOSIS — M545 Low back pain: Secondary | ICD-10-CM | POA: Diagnosis not present

## 2019-07-23 DIAGNOSIS — M545 Low back pain: Secondary | ICD-10-CM | POA: Diagnosis not present

## 2019-07-30 DIAGNOSIS — R69 Illness, unspecified: Secondary | ICD-10-CM | POA: Diagnosis not present

## 2019-08-05 DIAGNOSIS — N182 Chronic kidney disease, stage 2 (mild): Secondary | ICD-10-CM | POA: Diagnosis not present

## 2019-08-05 DIAGNOSIS — M5416 Radiculopathy, lumbar region: Secondary | ICD-10-CM | POA: Diagnosis not present

## 2019-08-05 DIAGNOSIS — E1129 Type 2 diabetes mellitus with other diabetic kidney complication: Secondary | ICD-10-CM | POA: Diagnosis not present

## 2019-08-05 DIAGNOSIS — Z01818 Encounter for other preprocedural examination: Secondary | ICD-10-CM | POA: Diagnosis not present

## 2019-08-05 DIAGNOSIS — I129 Hypertensive chronic kidney disease with stage 1 through stage 4 chronic kidney disease, or unspecified chronic kidney disease: Secondary | ICD-10-CM | POA: Diagnosis not present

## 2019-08-05 DIAGNOSIS — I517 Cardiomegaly: Secondary | ICD-10-CM | POA: Diagnosis not present

## 2019-08-14 NOTE — Patient Instructions (Addendum)
DUE TO COVID-19 ONLY ONE VISITOR IS ALLOWED TO COME WITH YOU AND STAY IN THE WAITING ROOM ONLY DURING PRE OP AND PROCEDURE DAY OF SURGERY. THE 1 VISITOR MAY VISIT WITH YOU AFTER SURGERY IN YOUR PRIVATE ROOM DURING VISITING HOURS ONLY!  YOU NEED TO HAVE A COVID 19 TEST ON__Saturday 09/26/2020____ @__09 :00____, THIS TEST MUST BE DONE BEFORE SURGERY, COME  [address], [place] , .  [facility]) ONCE YOUR COVID TEST IS COMPLETED, PLEASE BEGIN THE QUARANTINE INSTRUCTIONS AS OUTLINED IN YOUR HANDOUT.                [facility]    Your procedure is scheduled on: Wednesday 08/21/2019   Report to [facility] Main  Entrance              Report to Short Stay at  0530  AM   How to Manage Your Diabetes Before and After Surgery  Why is it important to control my blood sugar before and after surgery? . Improving blood sugar levels before and after surgery helps healing and can limit problems. . A way of improving blood sugar control is eating a healthy diet by: o  Eating less sugar and carbohydrates o  Increasing activity/exercise o  Talking with your doctor about reaching your blood sugar goals . High blood sugars (greater than 180 mg/dL) can raise your risk of infections and slow your recovery, so you will need to focus on controlling your diabetes during the weeks before surgery. . Make sure that the doctor who takes care of your diabetes knows about your planned surgery including the date and location.  How do I manage my blood sugar before surgery? . Check your blood sugar at least 4 times a day, starting 2 days before surgery, to make sure that the level is not too high or low. o Check your blood sugar the morning of your surgery when you wake up and every 2 hours until you get to the Short Stay unit. . If your blood sugar is less than 70 mg/dL, you will need to treat for low blood sugar: o Do not take insulin. o Treat a low blood sugar (less than 70  mg/dL) with  cup of clear juice (cranberry or apple), 4 glucose tablets, OR glucose gel. o Recheck blood sugar in 15 minutes after treatment (to make sure it is greater than 70 mg/dL). If your blood sugar is not greater than 70 mg/dL on recheck, call [facility] for further instructions. . Report your blood sugar to the short stay nurse when you get to Short Stay.  . If you are admitted to the hospital after surgery: o Your blood sugar will be checked by the staff and you will probably be given insulin after surgery (instead of oral diabetes medicines) to make sure you have good blood sugar levels. o The goal for blood sugar control after surgery is 80-180 mg/dL.   WHAT DO I DO ABOUT MY DIABETES MEDICATION?         The day before surgery, Take Metformin as usual.  . Do not take oral diabetes medicines (pills) the morning of surgery.     Call this number if you have problems the morning of surgery [phone]    Remember: Do not eat food  :After Midnight.             NO SOLID FOOD AFTER MIDNIGHT THE NIGHT PRIOR TO SURGERY. NOTHING BY MOUTH EXCEPT CLEAR LIQUIDS  UNTIL 0430 am .              PLEASE FINISH ENSURE DRINK PER SURGEON ORDER  WHICH NEEDS TO BE COMPLETED AT  0430 am.   CLEAR LIQUID DIET   Foods Allowed                                                                     Foods Excluded  Coffee and tea, regular and decaf                             liquids that you cannot  Plain Jell-O any favor except red or purple                                           see through such as: Fruit ices (not with fruit pulp)                                     milk, soups, orange juice  Iced Popsicles                                    All solid food Carbonated beverages, regular and diet                                    Cranberry, grape and apple juices Sports drinks like Gatorade Lightly seasoned clear broth or consume(fat free) Sugar, honey syrup  Sample Menu Breakfast                                 Lunch                                     Supper Cranberry juice                    Beef broth                            Chicken broth Jell-O                                     Grape juice                           Apple juice Coffee or tea                        Jell-O  Popsicle                                                Coffee or tea                        Coffee or tea  _____________________________________________________________________                BRUSH YOUR TEETH MORNING OF SURGERY AND RINSE YOUR MOUTH OUT, NO CHEWING GUM CANDY OR MINTS.     Take these medicines the morning of surgery with A SIP OF WATER: Amlodipine (Norvasc), Metoprolol succinate (Toprol-XL)              DO NOT TAKE ANY DIABETIC MEDICATIONS DAY OF YOUR SURGERY!                               You may not have any metal on your body including hair pins and              piercings  Do not wear jewelry, make-up, lotions, powders or perfumes, deodorant             Do not wear nail polish.  Do not shave  48 hours prior to surgery.              Do not bring valuables to the hospital. [facility] IS NOT             RESPONSIBLE   FOR VALUABLES.  Contacts, dentures or bridgework may not be worn into surgery.  Leave suitcase in the car. After surgery it may be brought to your room.                  Please read over the following fact sheets you were given: _____________________________________________________________________  [facility] - Preparing for Surgery Before surgery, you can play an important role.  Because skin is not sterile, your skin needs to be as free of germs as possible.  You can reduce the number of germs on your skin by washing with CHG (chlorahexidine gluconate) soap before surgery.  CHG is an antiseptic cleaner which kills germs and bonds with the skin to continue killing germs even after washing. Please DO NOT use if you have an  allergy to CHG or antibacterial soaps.  If your skin becomes reddened/irritated stop using the CHG and inform your nurse when you arrive at Short Stay. Do not shave (including legs and underarms) for at least 48 hours prior to the first CHG shower.  You may shave your face/neck. Please follow these instructions carefully:  1.  Shower with CHG Soap the night before surgery and the  morning of Surgery.  2.  If you choose to wash your hair, wash your hair first as usual with your  normal  shampoo.  3.  After you shampoo, rinse your hair and body thoroughly to remove the  shampoo.                           4.  Use CHG as you would any other liquid soap.  You can apply chg directly  to the skin and wash  Gently with a scrungie or clean washcloth.  5.  Apply the CHG Soap to your body ONLY FROM THE NECK DOWN.   Do not use on face/ open                           Wound or open sores. Avoid contact with eyes, ears mouth and genitals (private parts).                       Wash face,  Genitals (private parts) with your normal soap.             6.  Wash thoroughly, paying special attention to the area where your surgery  will be performed.  7.  Thoroughly rinse your body with warm water from the neck down.  8.  DO NOT shower/wash with your normal soap after using and rinsing off  the CHG Soap.                9.  Pat yourself dry with a clean towel.            10.  Wear clean pajamas.            11.  Place clean sheets on your bed the night of your first shower and do not  sleep with pets. Day of Surgery : Do not apply any lotions/deodorants the morning of surgery.  Please wear clean clothes to the hospital/surgery center.  FAILURE TO FOLLOW THESE INSTRUCTIONS MAY RESULT IN THE CANCELLATION OF YOUR SURGERY PATIENT SIGNATURE_________________________________  NURSE  SIGNATURE__________________________________  ________________________________________________________________________   Monica Hamilton  An incentive spirometer is a tool that can help keep your lungs clear and active. This tool measures how well you are filling your lungs with each breath. Taking long deep breaths may help reverse or decrease the chance of developing breathing (pulmonary) problems (especially infection) following:  A long period of time when you are unable to move or be active. BEFORE THE PROCEDURE   If the spirometer includes an indicator to show your best effort, your nurse or respiratory therapist will set it to a desired goal.  If possible, sit up straight or lean slightly forward. Try not to slouch.  Hold the incentive spirometer in an upright position. INSTRUCTIONS FOR USE  1. Sit on the edge of your bed if possible, or sit up as far as you can in bed or on a chair. 2. Hold the incentive spirometer in an upright position. 3. Breathe out normally. 4. Place the mouthpiece in your mouth and seal your lips tightly around it. 5. Breathe in slowly and as deeply as possible, raising the piston or the ball toward the top of the column. 6. Hold your breath for 3-5 seconds or for as long as possible. Allow the piston or ball to fall to the bottom of the column. 7. Remove the mouthpiece from your mouth and breathe out normally. 8. Rest for a few seconds and repeat Steps 1 through 7 at least 10 times every 1-2 hours when you are awake. Take your time and take a few normal breaths between deep breaths. 9. The spirometer may include an indicator to show your best effort. Use the indicator as a goal to work toward during each repetition. 10. After each set of 10 deep breaths, practice coughing to be sure your lungs are clear. If you have an incision (the cut made at the time of surgery),  support your incision when coughing by placing a pillow or rolled up towels firmly  against it. Once you are able to get out of bed, walk around indoors and cough well. You may stop using the incentive spirometer when instructed by your caregiver.  RISKS AND COMPLICATIONS  Take your time so you do not get dizzy or light-headed.  If you are in pain, you may need to take or ask for pain medication before doing incentive spirometry. It is harder to take a deep breath if you are having pain. AFTER USE  Rest and breathe slowly and easily.  It can be helpful to keep track of a log of your progress. Your caregiver can provide you with a simple table to help with this. If you are using the spirometer at home, follow these instructions: [place] IF:   You are having difficultly using the spirometer.  You have trouble using the spirometer as often as instructed.  Your pain medication is not giving enough relief while using the spirometer.  You develop fever of 100.5 F (38.1 C) or higher. SEEK IMMEDIATE MEDICAL CARE IF:   You cough up bloody sputum that had not been present before.  You develop fever of 102 F (38.9 C) or greater.  You develop worsening pain at or near the incision site. MAKE SURE YOU:   Understand these instructions.  Will watch your condition.  Will get help right away if you are not doing well or get worse. Document Released: 03/20/2007 Document Revised: 01/30/2012 Document Reviewed: 05/21/2007 [employer] Patient Information 2014 [employer], [place].   ________________________________________________________________________             WHAT IS A BLOOD TRANSFUSION? Blood Transfusion Information  A transfusion is the replacement of blood or some of its parts. Blood is made up of multiple cells which provide different functions.  Red blood cells carry oxygen and are used for blood loss replacement.  White blood cells fight against infection.  Platelets control bleeding.  Plasma helps clot blood.  Other blood products are available  for specialized needs, such as hemophilia or other clotting disorders. BEFORE THE TRANSFUSION  Who gives blood for transfusions?   Healthy volunteers who are fully evaluated to make sure their blood is safe. This is blood bank blood. Transfusion therapy is the safest it has ever been in the practice of medicine. Before blood is taken from a donor, a complete history is taken to make sure that person has no history of diseases nor engages in risky social behavior (examples are intravenous drug use or sexual activity with multiple partners). The donor's travel history is screened to minimize risk of transmitting infections, such as malaria. The donated blood is tested for signs of infectious diseases, such as HIV and hepatitis. The blood is then tested to be sure it is compatible with you in order to minimize the chance of a transfusion reaction. If you or a relative donates blood, this is often done in anticipation of surgery and is not appropriate for emergency situations. It takes many days to process the donated blood. RISKS AND COMPLICATIONS Although transfusion therapy is very safe and saves many lives, the main dangers of transfusion include:   Getting an infectious disease.  Developing a transfusion reaction. This is an allergic reaction to something in the blood you were given. Every precaution is taken to prevent this. The decision to have a blood transfusion has been considered carefully by your caregiver before blood is given. Blood is  not given unless the benefits outweigh the risks. AFTER THE TRANSFUSION  Right after receiving a blood transfusion, you will usually feel much better and more energetic. This is especially true if your red blood cells have gotten low (anemic). The transfusion raises the level of the red blood cells which carry oxygen, and this usually causes an energy increase.  The nurse administering the transfusion will monitor you carefully for complications. HOME CARE  INSTRUCTIONS  No special instructions are needed after a transfusion. You may find your energy is better. Speak with your caregiver about any limitations on activity for underlying diseases you may have. SEEK MEDICAL CARE IF:   Your condition is not improving after your transfusion.  You develop redness or irritation at the intravenous (IV) site. SEEK IMMEDIATE MEDICAL CARE IF:  Any of the following symptoms occur over the next 12 hours:  Shaking chills.  You have a temperature by mouth above 102 F (38.9 C), not controlled by medicine.  Chest, back, or muscle pain.  People around you feel you are not acting correctly or are confused.  Shortness of breath or difficulty breathing.  Dizziness and fainting.  You get a rash or develop hives.  You have a decrease in urine output.  Your urine turns a dark color or changes to pink, red, or brown. Any of the following symptoms occur over the next 10 days:  You have a temperature by mouth above 102 F (38.9 C), not controlled by medicine.  Shortness of breath.  Weakness after normal activity.  The white part of the eye turns yellow (jaundice).  You have a decrease in the amount of urine or are urinating less often.  Your urine turns a dark color or changes to pink, red, or brown. Document Released: 11/04/2000 Document Revised: 01/30/2012 Document Reviewed: 06/23/2008 [facility] Patient Information 2014 [place], [place].  _______________________________________________________________________

## 2019-08-15 ENCOUNTER — Encounter (HOSPITAL_COMMUNITY)
Admission: RE | Admit: 2019-08-15 | Discharge: 2019-08-15 | Disposition: A | Discharge status: Home / Self Care | Payer: Medicare HMO | Source: Ambulatory Visit | Attending: Orthopedic Surgery | Admitting: Orthopedic Surgery

## 2019-08-15 ENCOUNTER — Encounter (HOSPITAL_COMMUNITY): Payer: Self-pay

## 2019-08-15 ENCOUNTER — Other Ambulatory Visit: Payer: Self-pay

## 2019-08-15 ENCOUNTER — Ambulatory Visit (HOSPITAL_COMMUNITY)
Admission: RE | Admit: 2019-08-15 | Discharge: 2019-08-15 | Disposition: A | Discharge status: Home / Self Care | Payer: Medicare HMO | Source: Ambulatory Visit | Attending: Surgical | Admitting: Surgical

## 2019-08-15 DIAGNOSIS — M5136 Other intervertebral disc degeneration, lumbar region: Secondary | ICD-10-CM | POA: Diagnosis not present

## 2019-08-15 DIAGNOSIS — E785 Hyperlipidemia, unspecified: Secondary | ICD-10-CM | POA: Diagnosis not present

## 2019-08-15 DIAGNOSIS — Z79899 Other long term (current) drug therapy: Secondary | ICD-10-CM | POA: Diagnosis not present

## 2019-08-15 DIAGNOSIS — I10 Essential (primary) hypertension: Secondary | ICD-10-CM | POA: Diagnosis not present

## 2019-08-15 DIAGNOSIS — E119 Type 2 diabetes mellitus without complications: Secondary | ICD-10-CM | POA: Insufficient documentation

## 2019-08-15 DIAGNOSIS — Z7982 Long term (current) use of aspirin: Secondary | ICD-10-CM | POA: Diagnosis not present

## 2019-08-15 DIAGNOSIS — Z01818 Encounter for other preprocedural examination: Secondary | ICD-10-CM | POA: Insufficient documentation

## 2019-08-15 DIAGNOSIS — Z7984 Long term (current) use of oral hypoglycemic drugs: Secondary | ICD-10-CM | POA: Diagnosis not present

## 2019-08-15 DIAGNOSIS — M545 Low back pain, unspecified: Secondary | ICD-10-CM

## 2019-08-15 DIAGNOSIS — M5137 Other intervertebral disc degeneration, lumbosacral region: Secondary | ICD-10-CM | POA: Diagnosis not present

## 2019-08-15 HISTORY — DX: Rheumatic fever without heart involvement: I00

## 2019-08-15 HISTORY — DX: Unspecified macular degeneration: H35.30

## 2019-08-15 LAB — CBC WITH DIFFERENTIAL/PLATELET
Abs Immature Granulocytes: 0.02 10*3/uL (ref 0.00–0.07)
Basophils Absolute: 0.1 10*3/uL (ref 0.0–0.1)
Basophils Relative: 1 %
Eosinophils Absolute: 0.3 10*3/uL (ref 0.0–0.5)
Eosinophils Relative: 4 %
HCT: 34.2 % — ABNORMAL LOW (ref 36.0–46.0)
Hemoglobin: 11.4 g/dL — ABNORMAL LOW (ref 12.0–15.0)
Immature Granulocytes: 0 %
Lymphocytes Relative: 29 %
Lymphs Abs: 1.9 10*3/uL (ref 0.7–4.0)
MCH: 29.8 pg (ref 26.0–34.0)
MCHC: 33.3 g/dL (ref 30.0–36.0)
MCV: 89.5 fL (ref 80.0–100.0)
Monocytes Absolute: 0.6 10*3/uL (ref 0.1–1.0)
Monocytes Relative: 9 %
Neutro Abs: 3.7 10*3/uL (ref 1.7–7.7)
Neutrophils Relative %: 57 %
Platelets: 224 10*3/uL (ref 150–400)
RBC: 3.82 MIL/uL — ABNORMAL LOW (ref 3.87–5.11)
RDW: 12.5 % (ref 11.5–15.5)
WBC: 6.6 10*3/uL (ref 4.0–10.5)
nRBC: 0 % (ref 0.0–0.2)

## 2019-08-15 LAB — SURGICAL PCR SCREEN
MRSA, PCR: NEGATIVE
Staphylococcus aureus: NEGATIVE

## 2019-08-15 LAB — COMPREHENSIVE METABOLIC PANEL
ALT: 15 U/L (ref 0–44)
AST: 18 U/L (ref 15–41)
Albumin: 4.2 g/dL (ref 3.5–5.0)
Alkaline Phosphatase: 50 U/L (ref 38–126)
Anion gap: 9 (ref 5–15)
BUN: 15 mg/dL (ref 8–23)
CO2: 25 mmol/L (ref 22–32)
Calcium: 9.1 mg/dL (ref 8.9–10.3)
Chloride: 98 mmol/L (ref 98–111)
Creatinine, Ser: 0.52 mg/dL (ref 0.44–1.00)
GFR calc Af Amer: >60 mL/min (ref >60)
GFR calc non Af Amer: >60 mL/min (ref >60)
Glucose, Bld: 155 mg/dL — ABNORMAL HIGH (ref 70–99)
Potassium: 3.5 mmol/L (ref 3.5–5.1)
Sodium: 132 mmol/L — ABNORMAL LOW (ref 135–145)
Total Bilirubin: 0.6 mg/dL (ref 0.3–1.2)
Total Protein: 6.6 g/dL (ref 6.5–8.1)

## 2019-08-15 LAB — APTT: aPTT: 29 seconds (ref 24–36)

## 2019-08-15 LAB — ABO/RH: ABO/RH(D): O POS

## 2019-08-15 LAB — PROTIME-INR
INR: 1 (ref 0.8–1.2)
Prothrombin Time: 13 seconds (ref 11.4–15.2)

## 2019-08-15 LAB — GLUCOSE, CAPILLARY: Glucose-Capillary: 181 mg/dL — ABNORMAL HIGH (ref 70–99)

## 2019-08-15 NOTE — Progress Notes (Signed)
PCP - Dr. Burnard Bunting Cardiologist - none  Chest x-ray - 01/21/2013 epic EKG - 02/01/2013 epic Stress Test - none ECHO - none Cardiac Cath - none  Sleep Study - none CPAP - none  Fasting Blood Sugar -  Checks Blood Sugar ___0__ times a day... doesn't check it  Blood Thinner Instructions: Aspirin Instructions: 81mg , was told to stop taking it on 08/16/2019 per Dr. Reynaldo Minium  Last Dose:  Anesthesia review:   Patient denies shortness of breath, fever, cough and chest pain at PAT appointment   Patient verbalized understanding of instructions that were given to them at the PAT appointment. Patient was also instructed that they will need to review over the PAT instructions again at home before surgery.

## 2019-08-16 NOTE — H&P (Signed)
Monica Hamilton is an 78 y.o. female.   Chief Complaint: low back pain HPI: The patient is a 78 year old female who presented to the office with the chief complaint of low back pain. No known injuries. Initially no radicular symptoms but later developed right leg pain.  Xrays showed severe disc degeneration at L5-S1. She did not improve with conservative treatment including oral prednisone, activity modification, gabapentin, and SNRB. CT myelogram showed spinal stenosis and disc herniation at L3-L4 on the right.   Past Medical History:  Diagnosis Date  . Arthritis   . Colon polyp    adenomarous  . Diabetes mellitus without complication ([place])   . Diverticulosis   . Hepatomegaly   . Hyperlipidemia   . Hypertension   . Left ventricular hypertrophy   . Macular degeneration of right eye   . Mitral valve prolapse   . Perforated ulcer ([place]) 2011  . Pneumonia   . 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 ABDOMINAL HYSTERECTOMY    . APPENDECTOMY    . BLADDER SUSPENSION    . BREAST BIOPSY Left   . BREAST EXCISIONAL BIOPSY Left   . CHOLECYSTECTOMY    . DILATION AND CURETTAGE OF UTERUS    . HEAD & NECK SKIN LESION EXCISIONAL BIOPSY     top left scalp  . SHOULDER ARTHROSCOPY WITH SUBACROMIAL DECOMPRESSION Right 01/30/2013   Procedure: SHOULDER ARTHROSCOPY WITH SUBACROMIAL DECOMPRESSION;  Surgeon: Magnus Sinning, MD;  Location: [facility];  Service: Orthopedics;  Laterality: Right;  with labral debridement    Family History  Problem Relation Age of Onset  . Heart disease Father   . Breast cancer Maternal Aunt   . Diabetes Brother   . Leukemia Sister   . Colon cancer Neg Hx   . Esophageal cancer Neg Hx   . Rectal cancer Neg Hx   . Stomach cancer Neg Hx    Social History:  reports that she has never smoked. She has never used smokeless tobacco. She reports that she does not drink alcohol or use  drugs.  Allergies:  Allergies  Allergen Reactions  . Ace Inhibitors Cough    Current Outpatient Medications:  .  amLODipine (NORVASC) 5 MG tablet, Take 5 mg by mouth daily., Disp: , Rfl:  .  aspirin 81 MG EC tablet, Take 81 mg by mouth daily. , Disp: , Rfl:  .  CINNAMON PO, Take 1,000 mg by mouth daily., Disp: , Rfl:  .  gabapentin (NEURONTIN) 300 MG capsule, Take 600 mg by mouth at bedtime., Disp: , Rfl:  .  metFORMIN (GLUCOPHAGE) 1000 MG tablet, Take 1,000 mg by mouth 2 (two) times daily. ,  .  metoprolol succinate (TOPROL-XL) 50 MG 24 hr tablet, Take 50 mg by mouth daily. Take with or immediately following a meal., Disp: , Rfl:  .  Multiple Vitamins-Minerals (ICAPS AREDS 2) CAPS, Take 1 capsule by mouth 2 times QD .  valsartan-hydrochlorothiazide (DIOVAN-HCT) 160-25 MG tablet, Take 1 tablet by mouth daily.,   Results for orders placed or performed during the hospital encounter of 08/15/19 (from the past 48 hour(s))  Surgical pcr screen     Status: None   Collection Time: 08/15/19  9:07 AM   Specimen: Nasal Mucosa; Nasal Swab  Result Value Ref Range   MRSA, PCR NEGATIVE NEGATIVE   Staphylococcus aureus NEGATIVE NEGATIVE    Comment: (  NOTE) The Xpert SA Assay (FDA approved for NASAL specimens in patients 23 years of age and older), is one component of a comprehensive surveillance program. It is not intended to diagnose infection nor to guide or monitor treatment. Performed at [facility], [address], [place], [place] [address]   Glucose, capillary     Status: Abnormal   Collection Time: 08/15/19  9:16 AM  Result Value Ref Range   Glucose-Capillary 181 (H) 70 - 99 mg/dL  APTT     Status: None   Collection Time: 08/15/19 10:06 AM  Result Value Ref Range   aPTT 29 24 - 36 seconds    Comment: Performed at [facility], [address] [place], [place] [address]  CBC WITH DIFFERENTIAL     Status: Abnormal   Collection Time: 08/15/19  10:06 AM  Result Value Ref Range   WBC 6.6 4.0 - 10.5 K/uL   RBC 3.82 (L) 3.87 - 5.11 MIL/uL   Hemoglobin 11.4 (L) 12.0 - 15.0 g/dL   HCT 25.0 (L) 53.9 - 76.7 %   MCV 89.5 80.0 - 100.0 fL   MCH 29.8 26.0 - 34.0 pg   MCHC 33.3 30.0 - 36.0 g/dL   RDW 34.1 93.7 - 90.2 %   Platelets 224 150 - 400 K/uL   nRBC 0.0 0.0 - 0.2 %   Neutrophils Relative % 57 %   Neutro Abs 3.7 1.7 - 7.7 K/uL   Lymphocytes Relative 29 %   Lymphs Abs 1.9 0.7 - 4.0 K/uL   Monocytes Relative 9 %   Monocytes Absolute 0.6 0.1 - 1.0 K/uL   Eosinophils Relative 4 %   Eosinophils Absolute 0.3 0.0 - 0.5 K/uL   Basophils Relative 1 %   Basophils Absolute 0.1 0.0 - 0.1 K/uL   Immature Granulocytes 0 %   Abs Immature Granulocytes 0.02 0.00 - 0.07 K/uL    Comment: Performed at [facility], [address], [place], [place] [address]  Comprehensive metabolic panel     Status: Abnormal   Collection Time: 08/15/19 10:06 AM  Result Value Ref Range   Sodium 132 (L) 135 - 145 mmol/L   Potassium 3.5 3.5 - 5.1 mmol/L   Chloride 98 98 - 111 mmol/L   CO2 25 22 - 32 mmol/L   Glucose, Bld 155 (H) 70 - 99 mg/dL   BUN 15 8 - 23 mg/dL   Creatinine, Ser 5.32 0.44 - 1.00 mg/dL   Calcium 9.1 8.9 - 99.2 mg/dL   Total Protein 6.6 6.5 - 8.1 g/dL   Albumin 4.2 3.5 - 5.0 g/dL   AST 18 15 - 41 U/L   ALT 15 0 - 44 U/L   Alkaline Phosphatase 50 38 - 126 U/L   Total Bilirubin 0.6 0.3 - 1.2 mg/dL   GFR calc non Af Amer >60 >60 mL/min   GFR calc Af Amer >60 >60 mL/min   Anion gap 9 5 - 15    Comment: Performed at [facility], [address] [place], [place] [address]  Protime-INR     Status: None   Collection Time: 08/15/19 10:06 AM  Result Value Ref Range   Prothrombin Time 13.0 11.4 - 15.2 seconds   INR 1.0 0.8 - 1.2    Comment: (NOTE) INR goal varies based on device and disease states. Performed at [facility], [address], [place], [place] [address]   Type and  screen Order type  and screen if day of surgery is less than 15 days from draw of preadmission visit or order morning of surgery if day of surgery is greater than 6 days from preadmission visit.     Status: None   Collection Time: 08/15/19 10:06 AM  Result Value Ref Range   ABO/RH(D) O POS    Antibody Screen NEG    Sample Expiration 08/29/2019,2359    Extend sample reason      NO TRANSFUSIONS OR PREGNANCY IN THE PAST 3 MONTHS Performed at [facility], [address] [place], [place] [address]   ABO/Rh     Status: None   Collection Time: 08/15/19 10:09 AM  Result Value Ref Range   ABO/RH(D)      O POS Performed at [facility], [address] [place], [place] [address]    Dg Lumbar Spine 2-3 Views  Result Date: 08/15/2019 CLINICAL DATA:  Multiple disc protrusions. EXAM: LUMBAR SPINE - 2-3 VIEW COMPARISON:  Lumbar CT myelogram dated 04/29/2019 FINDINGS: There are 5 typical lumbar segments. There is disc space narrowing at L3-4 and L5-S1. Lateral alignment is normal. No significant facet arthritis. 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 Electronically Signed   By: Francene BoyersJames  Maxwell M.D.   On: 08/15/2019 15:21    Review of Systems  Constitutional: Negative.   Eyes: Negative.   Respiratory: Negative.   Cardiovascular: Negative.   Gastrointestinal: Negative.   Genitourinary: Negative.   Musculoskeletal: Positive for back pain, joint pain and myalgias. Negative for falls and neck pain.  Skin: Negative.   Neurological: Negative.   Endo/Heme/Allergies: Negative.   Psychiatric/Behavioral: Negative.    Vitals Temp (F)  98      Pulse Rate  67      Resp  16      BP  166/71      SpO2 (%)  100         Physical Exam  Constitutional: She appears well-developed and well-nourished. No distress.  HENT:  Head: Normocephalic and atraumatic.  Right Ear: External ear normal.  Left Ear: External ear normal.  Nose: Nose normal.   Mouth/Throat: Oropharynx is clear and moist.  Eyes: Conjunctivae and EOM are normal.  Neck: Normal range of motion. Neck supple.  Cardiovascular: Normal rate, regular rhythm, normal heart sounds and intact distal pulses.  No murmur heard. Respiratory: Effort normal and breath sounds normal. No respiratory distress. She has no wheezes.  GI: Soft. Bowel sounds are normal. She exhibits no distension. There is no abdominal tenderness.  Musculoskeletal:     Right hip: Normal.     Left hip: Normal.     Right knee: Normal.     Left knee: Normal.     Comments: Pain with motion of the lumbar spine with radiation of pain into the right LE. Positive SLR on the right.   Neurological: She has normal strength. No sensory deficit.  Skin: She is not diaphoretic.     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 Risks and benefits of the procedure discussed with the patient by Dr. Darrelyn HillockGioffre and has elected to move forward with surgery. Will stay overnight for observation.   H&P performed by Dr. Ranee Gosselinonald Gioffre, MD Documented by Dimitri PedAmber Lanier Millon, PA-C   Cornell BarmanAmber L Dvon Jiles, PA-C 08/16/2019, 9:51 AM

## 2019-08-17 ENCOUNTER — Other Ambulatory Visit (HOSPITAL_COMMUNITY)
Admission: RE | Admit: 2019-08-17 | Discharge: 2019-08-17 | Disposition: A | Discharge status: Home / Self Care | Payer: Medicare HMO | Source: Ambulatory Visit | Attending: Orthopedic Surgery | Admitting: Orthopedic Surgery

## 2019-08-17 DIAGNOSIS — Z20828 Contact with and (suspected) exposure to other viral communicable diseases: Secondary | ICD-10-CM | POA: Diagnosis not present

## 2019-08-17 DIAGNOSIS — Z01812 Encounter for preprocedural laboratory examination: Secondary | ICD-10-CM | POA: Insufficient documentation

## 2019-08-17 DIAGNOSIS — M5126 Other intervertebral disc displacement, lumbar region: Secondary | ICD-10-CM | POA: Diagnosis not present

## 2019-08-18 LAB — NOVEL CORONAVIRUS, NAA (HOSP ORDER, SEND-OUT TO REF LAB; TAT 18-24 HRS): SARS-CoV-2, NAA: NOT DETECTED

## 2019-08-21 ENCOUNTER — Encounter (HOSPITAL_COMMUNITY)
Admission: AD | Disposition: A | Discharge status: Home / Self Care | Payer: Self-pay | Source: Home / Self Care | Attending: Orthopedic Surgery

## 2019-08-21 ENCOUNTER — Ambulatory Visit (HOSPITAL_COMMUNITY): Payer: Medicare HMO

## 2019-08-21 ENCOUNTER — Encounter (HOSPITAL_COMMUNITY): Payer: Self-pay

## 2019-08-21 ENCOUNTER — Other Ambulatory Visit: Payer: Self-pay

## 2019-08-21 ENCOUNTER — Ambulatory Visit (HOSPITAL_COMMUNITY): Payer: Medicare HMO | Admitting: Physician Assistant

## 2019-08-21 ENCOUNTER — Observation Stay (HOSPITAL_COMMUNITY)
Admission: AD | Admit: 2019-08-21 | Discharge: 2019-08-22 | Disposition: A | Discharge status: Home / Self Care | Payer: Medicare HMO | Attending: Orthopedic Surgery | Admitting: Orthopedic Surgery

## 2019-08-21 ENCOUNTER — Ambulatory Visit (HOSPITAL_COMMUNITY): Payer: Medicare HMO | Admitting: Certified Registered Nurse Anesthetist

## 2019-08-21 DIAGNOSIS — I10 Essential (primary) hypertension: Secondary | ICD-10-CM | POA: Insufficient documentation

## 2019-08-21 DIAGNOSIS — Z7984 Long term (current) use of oral hypoglycemic drugs: Secondary | ICD-10-CM | POA: Diagnosis not present

## 2019-08-21 DIAGNOSIS — Z419 Encounter for procedure for purposes other than remedying health state, unspecified: Secondary | ICD-10-CM

## 2019-08-21 DIAGNOSIS — E785 Hyperlipidemia, unspecified: Secondary | ICD-10-CM | POA: Diagnosis not present

## 2019-08-21 DIAGNOSIS — Z79899 Other long term (current) drug therapy: Secondary | ICD-10-CM | POA: Insufficient documentation

## 2019-08-21 DIAGNOSIS — Z7982 Long term (current) use of aspirin: Secondary | ICD-10-CM | POA: Diagnosis not present

## 2019-08-21 DIAGNOSIS — E119 Type 2 diabetes mellitus without complications: Secondary | ICD-10-CM | POA: Insufficient documentation

## 2019-08-21 DIAGNOSIS — M48061 Spinal stenosis, lumbar region without neurogenic claudication: Principal | ICD-10-CM | POA: Insufficient documentation

## 2019-08-21 DIAGNOSIS — M5136 Other intervertebral disc degeneration, lumbar region: Secondary | ICD-10-CM | POA: Diagnosis not present

## 2019-08-21 DIAGNOSIS — Z981 Arthrodesis status: Secondary | ICD-10-CM | POA: Diagnosis not present

## 2019-08-21 DIAGNOSIS — M48062 Spinal stenosis, lumbar region with neurogenic claudication: Secondary | ICD-10-CM | POA: Diagnosis present

## 2019-08-21 DIAGNOSIS — M545 Low back pain: Secondary | ICD-10-CM | POA: Diagnosis present

## 2019-08-21 DIAGNOSIS — M5126 Other intervertebral disc displacement, lumbar region: Secondary | ICD-10-CM | POA: Diagnosis not present

## 2019-08-21 HISTORY — PX: LUMBAR LAMINECTOMY/DECOMPRESSION MICRODISCECTOMY: SHX5026

## 2019-08-21 LAB — GLUCOSE, CAPILLARY
Glucose-Capillary: 176 mg/dL — ABNORMAL HIGH (ref 70–99)
Glucose-Capillary: 176 mg/dL — ABNORMAL HIGH (ref 70–99)
Glucose-Capillary: 258 mg/dL — ABNORMAL HIGH (ref 70–99)
Glucose-Capillary: 223 mg/dL — ABNORMAL HIGH (ref 70–99)
Glucose-Capillary: 199 mg/dL — ABNORMAL HIGH (ref 70–99)

## 2019-08-21 LAB — TYPE AND SCREEN
ABO/RH(D): O POS
Antibody Screen: NEGATIVE

## 2019-08-21 LAB — HEMOGLOBIN A1C
Hgb A1c MFr Bld: 6.9 % — ABNORMAL HIGH (ref 4.8–5.6)
Mean Plasma Glucose: 151.33 mg/dL

## 2019-08-21 SURGERY — LUMBAR LAMINECTOMY/DECOMPRESSION MICRODISCECTOMY 1 LEVEL
Anesthesia: General | Site: Back

## 2019-08-21 MED ORDER — ACETAMINOPHEN 650 MG RE SUPP: 650.0000 mg | RECTAL | Status: DC | PRN

## 2019-08-21 MED ORDER — SUCCINYLCHOLINE CHLORIDE 200 MG/10ML IV SOSY
PREFILLED_SYRINGE | INTRAVENOUS | Status: AC
  Filled 2019-08-21: qty 10

## 2019-08-21 MED ORDER — SUCCINYLCHOLINE CHLORIDE 200 MG/10ML IV SOSY
PREFILLED_SYRINGE | INTRAVENOUS | Status: DC | PRN
  Administered 2019-08-21: 120 mg via INTRAVENOUS

## 2019-08-21 MED ORDER — PROPOFOL 10 MG/ML IV BOLUS
INTRAVENOUS | Status: DC | PRN
  Administered 2019-08-21: 130 mg via INTRAVENOUS

## 2019-08-21 MED ORDER — LACTATED RINGERS IV SOLN
INTRAVENOUS | Status: DC
  Administered 2019-08-21 (×2): via INTRAVENOUS

## 2019-08-21 MED ORDER — ONDANSETRON HCL 4 MG/2ML IJ SOLN
INTRAMUSCULAR | Status: DC | PRN
  Administered 2019-08-21: 4 mg via INTRAVENOUS

## 2019-08-21 MED ORDER — ONDANSETRON HCL 4 MG PO TABS: 4.0000 mg | ORAL_TABLET | Freq: Four times a day (QID) | ORAL | Status: DC | PRN

## 2019-08-21 MED ORDER — MENTHOL 3 MG MT LOZG: 1.0000 | LOZENGE | OROMUCOSAL | Status: DC | PRN

## 2019-08-21 MED ORDER — VALSARTAN-HYDROCHLOROTHIAZIDE 160-25 MG PO TABS: 1.0000 | ORAL_TABLET | Freq: Every day | ORAL | Status: DC

## 2019-08-21 MED ORDER — DEXAMETHASONE SODIUM PHOSPHATE 10 MG/ML IJ SOLN
INTRAMUSCULAR | Status: DC | PRN
  Administered 2019-08-21: 5 mg via INTRAVENOUS

## 2019-08-21 MED ORDER — POLYETHYLENE GLYCOL 3350 17 G PO PACK: 17.0000 g | PACK | Freq: Every day | ORAL | Status: DC | PRN

## 2019-08-21 MED ORDER — METOPROLOL SUCCINATE ER 50 MG PO TB24
50.0000 mg | ORAL_TABLET | Freq: Every day | ORAL | Status: DC
  Administered 2019-08-22: 08:00:00 50 mg via ORAL
  Filled 2019-08-21: qty 1

## 2019-08-21 MED ORDER — SODIUM CHLORIDE 0.9 % IV SOLN
INTRAVENOUS | Status: DC | PRN
  Administered 2019-08-21: 35 ug/min via INTRAVENOUS

## 2019-08-21 MED ORDER — PROPOFOL 10 MG/ML IV BOLUS
INTRAVENOUS | Status: AC
  Filled 2019-08-21: qty 20

## 2019-08-21 MED ORDER — LIDOCAINE 2% (20 MG/ML) 5 ML SYRINGE
INTRAMUSCULAR | Status: DC | PRN
  Administered 2019-08-21: 60 mg via INTRAVENOUS

## 2019-08-21 MED ORDER — EPHEDRINE 5 MG/ML INJ
INTRAVENOUS | Status: AC
  Filled 2019-08-21: qty 10

## 2019-08-21 MED ORDER — GABAPENTIN 300 MG PO CAPS
600.0000 mg | ORAL_CAPSULE | Freq: Every day | ORAL | Status: DC
  Administered 2019-08-21: 600 mg via ORAL
  Filled 2019-08-21: qty 2

## 2019-08-21 MED ORDER — AMLODIPINE BESYLATE 5 MG PO TABS
5.0000 mg | ORAL_TABLET | Freq: Every day | ORAL | Status: DC
  Administered 2019-08-22: 5 mg via ORAL
  Filled 2019-08-21: qty 1

## 2019-08-21 MED ORDER — OXYCODONE HCL 5 MG PO TABS: 5.0000 mg | ORAL_TABLET | Freq: Once | ORAL | Status: DC | PRN

## 2019-08-21 MED ORDER — PHENYLEPHRINE 40 MCG/ML (10ML) SYRINGE FOR IV PUSH (FOR BLOOD PRESSURE SUPPORT)
PREFILLED_SYRINGE | INTRAVENOUS | Status: AC
  Filled 2019-08-21: qty 10

## 2019-08-21 MED ORDER — ONDANSETRON HCL 4 MG/2ML IJ SOLN
INTRAMUSCULAR | Status: AC
  Filled 2019-08-21: qty 2

## 2019-08-21 MED ORDER — CHLORHEXIDINE GLUCONATE 4 % EX LIQD: 60.0000 mL | Freq: Once | CUTANEOUS | Status: DC

## 2019-08-21 MED ORDER — ROCURONIUM BROMIDE 10 MG/ML (PF) SYRINGE
PREFILLED_SYRINGE | INTRAVENOUS | Status: AC
  Filled 2019-08-21: qty 10

## 2019-08-21 MED ORDER — OXYCODONE HCL 5 MG/5ML PO SOLN: 5.0000 mg | Freq: Once | ORAL | Status: DC | PRN

## 2019-08-21 MED ORDER — HYDROMORPHONE HCL 1 MG/ML IJ SOLN: 0.5000 mg | INTRAMUSCULAR | Status: DC | PRN

## 2019-08-21 MED ORDER — PHENYLEPHRINE 40 MCG/ML (10ML) SYRINGE FOR IV PUSH (FOR BLOOD PRESSURE SUPPORT)
PREFILLED_SYRINGE | INTRAVENOUS | Status: DC | PRN
  Administered 2019-08-21 (×2): 80 ug via INTRAVENOUS

## 2019-08-21 MED ORDER — LACTATED RINGERS IV SOLN
INTRAVENOUS | Status: DC
  Administered 2019-08-21 – 2019-08-22 (×2): via INTRAVENOUS

## 2019-08-21 MED ORDER — FENTANYL CITRATE (PF) 250 MCG/5ML IJ SOLN
INTRAMUSCULAR | Status: AC
  Filled 2019-08-21: qty 5

## 2019-08-21 MED ORDER — SUGAMMADEX SODIUM 200 MG/2ML IV SOLN
INTRAVENOUS | Status: DC | PRN
  Administered 2019-08-21: 200 mg via INTRAVENOUS

## 2019-08-21 MED ORDER — METHOCARBAMOL 500 MG IVPB - SIMPLE MED
500.0000 mg | Freq: Four times a day (QID) | INTRAVENOUS | Status: DC | PRN
  Filled 2019-08-21: qty 50

## 2019-08-21 MED ORDER — HYDROCHLOROTHIAZIDE 25 MG PO TABS
25.0000 mg | ORAL_TABLET | Freq: Every day | ORAL | Status: DC
  Administered 2019-08-21 – 2019-08-22 (×2): 25 mg via ORAL
  Filled 2019-08-21 (×2): qty 1

## 2019-08-21 MED ORDER — DEXAMETHASONE SODIUM PHOSPHATE 10 MG/ML IJ SOLN
INTRAMUSCULAR | Status: AC
  Filled 2019-08-21: qty 1

## 2019-08-21 MED ORDER — HYDROCODONE-ACETAMINOPHEN 5-325 MG PO TABS: 1.0000 | ORAL_TABLET | ORAL | Status: DC | PRN

## 2019-08-21 MED ORDER — BUPIVACAINE LIPOSOME 1.3 % IJ SUSP
20.0000 mL | Freq: Once | INTRAMUSCULAR | Status: AC
  Administered 2019-08-21: 20 mL
  Filled 2019-08-21: qty 20

## 2019-08-21 MED ORDER — BACITRACIN ZINC 500 UNIT/GM EX OINT
TOPICAL_OINTMENT | CUTANEOUS | Status: DC | PRN
  Administered 2019-08-21: 1 via TOPICAL

## 2019-08-21 MED ORDER — LIDOCAINE 2% (20 MG/ML) 5 ML SYRINGE
INTRAMUSCULAR | Status: AC
  Filled 2019-08-21: qty 5

## 2019-08-21 MED ORDER — IRBESARTAN 150 MG PO TABS
150.0000 mg | ORAL_TABLET | Freq: Every day | ORAL | Status: DC
  Administered 2019-08-21 – 2019-08-22 (×2): 150 mg via ORAL
  Filled 2019-08-21 (×2): qty 1

## 2019-08-21 MED ORDER — FENTANYL CITRATE (PF) 100 MCG/2ML IJ SOLN
INTRAMUSCULAR | Status: DC | PRN
  Administered 2019-08-21 (×2): 25 ug via INTRAVENOUS
  Administered 2019-08-21: 50 ug via INTRAVENOUS

## 2019-08-21 MED ORDER — FLEET ENEMA 7-19 GM/118ML RE ENEM: 1.0000 | ENEMA | Freq: Once | RECTAL | Status: DC | PRN

## 2019-08-21 MED ORDER — BUPIVACAINE-EPINEPHRINE 0.5% -1:200000 IJ SOLN
INTRAMUSCULAR | Status: AC
  Filled 2019-08-21: qty 1

## 2019-08-21 MED ORDER — BUPIVACAINE-EPINEPHRINE 0.5% -1:200000 IJ SOLN
INTRAMUSCULAR | Status: DC | PRN
  Administered 2019-08-21: 15 mL

## 2019-08-21 MED ORDER — EPHEDRINE SULFATE-NACL 50-0.9 MG/10ML-% IV SOSY
PREFILLED_SYRINGE | INTRAVENOUS | Status: DC | PRN
  Administered 2019-08-21 (×2): 5 mg via INTRAVENOUS

## 2019-08-21 MED ORDER — HYDROCODONE-ACETAMINOPHEN 7.5-325 MG PO TABS: 1.0000 | ORAL_TABLET | Freq: Four times a day (QID) | ORAL | 0 refills | Status: AC | PRN

## 2019-08-21 MED ORDER — SODIUM CHLORIDE 0.9 % IV SOLN
INTRAVENOUS | Status: AC
  Filled 2019-08-21: qty 500000

## 2019-08-21 MED ORDER — HYDROMORPHONE HCL 1 MG/ML IJ SOLN: 0.2500 mg | INTRAMUSCULAR | Status: DC | PRN

## 2019-08-21 MED ORDER — BISACODYL 5 MG PO TBEC: 5.0000 mg | DELAYED_RELEASE_TABLET | Freq: Every day | ORAL | Status: DC | PRN

## 2019-08-21 MED ORDER — ROCURONIUM BROMIDE 50 MG/5ML IV SOSY
PREFILLED_SYRINGE | INTRAVENOUS | Status: DC | PRN
  Administered 2019-08-21: 40 mg via INTRAVENOUS

## 2019-08-21 MED ORDER — INSULIN ASPART 100 UNIT/ML ~~LOC~~ SOLN
0.0000 [IU] | Freq: Three times a day (TID) | SUBCUTANEOUS | Status: DC
  Administered 2019-08-21: 5 [IU] via SUBCUTANEOUS
  Administered 2019-08-22: 08:00:00 3 [IU] via SUBCUTANEOUS
  Administered 2019-08-22: 8 [IU] via SUBCUTANEOUS

## 2019-08-21 MED ORDER — METHOCARBAMOL 500 MG PO TABS: 500.0000 mg | ORAL_TABLET | Freq: Four times a day (QID) | ORAL | 0 refills | Status: AC | PRN

## 2019-08-21 MED ORDER — THROMBIN (RECOMBINANT) 5000 UNITS EX SOLR
CUTANEOUS | Status: AC
  Filled 2019-08-21: qty 5000

## 2019-08-21 MED ORDER — BACITRACIN ZINC 500 UNIT/GM EX OINT
TOPICAL_OINTMENT | CUTANEOUS | Status: AC
  Filled 2019-08-21: qty 28.35

## 2019-08-21 MED ORDER — ACETAMINOPHEN 325 MG PO TABS: 650.0000 mg | ORAL_TABLET | ORAL | Status: DC | PRN

## 2019-08-21 MED ORDER — THROMBIN (RECOMBINANT) 5000 UNITS EX SOLR
CUTANEOUS | Status: DC | PRN
  Administered 2019-08-21: 5000 [IU] via TOPICAL

## 2019-08-21 MED ORDER — ONDANSETRON HCL 4 MG/2ML IJ SOLN: 4.0000 mg | Freq: Four times a day (QID) | INTRAMUSCULAR | Status: DC | PRN

## 2019-08-21 MED ORDER — PHENOL 1.4 % MT LIQD
1.0000 | OROMUCOSAL | Status: DC | PRN
  Filled 2019-08-21: qty 177

## 2019-08-21 MED ORDER — CEFAZOLIN SODIUM-DEXTROSE 2-4 GM/100ML-% IV SOLN
2.0000 g | INTRAVENOUS | Status: AC
  Administered 2019-08-21: 2 g via INTRAVENOUS
  Filled 2019-08-21: qty 100

## 2019-08-21 MED ORDER — SODIUM CHLORIDE 0.9 % IV SOLN
INTRAVENOUS | Status: DC | PRN
  Administered 2019-08-21: 500 mL

## 2019-08-21 MED ORDER — HYDROCODONE-ACETAMINOPHEN 7.5-325 MG PO TABS: 2.0000 | ORAL_TABLET | ORAL | Status: DC | PRN

## 2019-08-21 MED ORDER — METHOCARBAMOL 500 MG PO TABS
500.0000 mg | ORAL_TABLET | Freq: Four times a day (QID) | ORAL | Status: DC | PRN
  Administered 2019-08-21: 500 mg via ORAL
  Filled 2019-08-21: qty 1

## 2019-08-21 MED ORDER — CEFAZOLIN SODIUM-DEXTROSE 1-4 GM/50ML-% IV SOLN
1.0000 g | Freq: Three times a day (TID) | INTRAVENOUS | Status: AC
  Administered 2019-08-21 – 2019-08-22 (×3): 1 g via INTRAVENOUS
  Filled 2019-08-21 (×3): qty 50

## 2019-08-21 MED ORDER — PROMETHAZINE HCL 25 MG/ML IJ SOLN: 6.2500 mg | INTRAMUSCULAR | Status: DC | PRN

## 2019-08-21 SURGICAL SUPPLY — 43 items
AGENT HMST SPONGE THK3/8 (HEMOSTASIS) ×1
BAG DECANTER FOR FLEXI CONT (MISCELLANEOUS) ×2 IMPLANT
BAG SPEC THK2 15X12 ZIP CLS (MISCELLANEOUS)
BAG ZIPLOCK 12X15 (MISCELLANEOUS) IMPLANT
CLEANER TIP ELECTROSURG 2X2 (MISCELLANEOUS) ×2 IMPLANT
COVER SURGICAL LIGHT HANDLE (MISCELLANEOUS) ×2 IMPLANT
COVER WAND RF STERILE (DRAPES) IMPLANT
DRAPE MICROSCOPE LEICA (MISCELLANEOUS) ×2 IMPLANT
DRAPE POUCH INSTRU U-SHP 10X18 (DRAPES) ×2 IMPLANT
DRAPE SHEET LG 3/4 BI-LAMINATE (DRAPES) ×2 IMPLANT
DRAPE SURG 17X11 SM STRL (DRAPES) ×2 IMPLANT
DRSG ADAPTIC 3X8 NADH LF (GAUZE/BANDAGES/DRESSINGS) ×2 IMPLANT
DRSG PAD ABDOMINAL 8X10 ST (GAUZE/BANDAGES/DRESSINGS) ×3 IMPLANT
DURAPREP 26ML APPLICATOR (WOUND CARE) ×2 IMPLANT
ELECT BLADE TIP CTD 4 INCH (ELECTRODE) ×2 IMPLANT
ELECT REM PT RETURN 15FT ADLT (MISCELLANEOUS) ×2 IMPLANT
GAUZE SPONGE 4X4 12PLY STRL (GAUZE/BANDAGES/DRESSINGS) ×2 IMPLANT
GLOVE BIOGEL PI IND STRL 8.5 (GLOVE) ×1 IMPLANT
GLOVE BIOGEL PI INDICATOR 8.5 (GLOVE) ×1
GLOVE ECLIPSE 8.0 STRL XLNG CF (GLOVE) ×2 IMPLANT
GOWN STRL REUS W/TWL XL LVL3 (GOWN DISPOSABLE) ×2 IMPLANT
HEMOSTAT SPONGE AVITENE ULTRA (HEMOSTASIS) ×2 IMPLANT
KIT BASIN OR (CUSTOM PROCEDURE TRAY) ×2 IMPLANT
KIT TURNOVER KIT A (KITS) IMPLANT
MANIFOLD NEPTUNE II (INSTRUMENTS) ×2 IMPLANT
NDL SPNL 18GX3.5 QUINCKE PK (NEEDLE) ×2 IMPLANT
NEEDLE HYPO 22GX1.5 SAFETY (NEEDLE) ×2 IMPLANT
NEEDLE SPNL 18GX3.5 QUINCKE PK (NEEDLE) ×4 IMPLANT
PACK LAMINECTOMY ORTHO (CUSTOM PROCEDURE TRAY) ×2 IMPLANT
PATTIES SURGICAL .5 X.5 (GAUZE/BANDAGES/DRESSINGS) IMPLANT
PATTIES SURGICAL .75X.75 (GAUZE/BANDAGES/DRESSINGS) ×2 IMPLANT
PATTIES SURGICAL 1X1 (DISPOSABLE) ×2 IMPLANT
SPONGE LAP 4X18 RFD (DISPOSABLE) IMPLANT
STAPLER VISISTAT 35W (STAPLE) ×2 IMPLANT
STRIP CLOSURE SKIN 1/2X4 (GAUZE/BANDAGES/DRESSINGS) ×1 IMPLANT
SUT VIC AB 1 CT1 27 (SUTURE) ×4
SUT VIC AB 1 CT1 27XBRD ANTBC (SUTURE) ×2 IMPLANT
SUT VIC AB 2-0 CT1 27 (SUTURE) ×4
SUT VIC AB 2-0 CT1 TAPERPNT 27 (SUTURE) ×2 IMPLANT
SYR 20ML LL LF (SYRINGE) ×4 IMPLANT
TAPE CLOTH SURG 6X10 WHT LF (GAUZE/BANDAGES/DRESSINGS) ×1 IMPLANT
TOWEL OR 17X26 10 PK STRL BLUE (TOWEL DISPOSABLE) ×2 IMPLANT
TOWEL OR NON WOVEN STRL DISP B (DISPOSABLE) ×2 IMPLANT

## 2019-08-21 NOTE — Transfer of Care (Signed)
Immediate Anesthesia Transfer of Care Note  Patient: Monica Hamilton  Procedure(s) Performed: Lumbar decompression L3-4 right, microdiscectomy L3-4 right (N/A Back)  Patient Location: PACU  Anesthesia Type:General  Level of Consciousness: awake, alert  and oriented  Airway & Oxygen Therapy: Patient Spontanous Breathing and Patient connected to face mask oxygen  Post-op Assessment: Report given to RN and Post -op Vital signs reviewed and stable  Post vital signs: Reviewed and stable  Last Vitals:  Vitals Value Taken Time  BP 165/76 08/21/19 0916  Temp    Pulse 86 08/21/19 0919  Resp 14 08/21/19 0919  SpO2 100 % 08/21/19 0919  Vitals shown include unvalidated device data.  Last Pain:  Vitals:   08/21/19 0536  TempSrc: Oral         Complications: No apparent anesthesia complications

## 2019-08-21 NOTE — Anesthesia Postprocedure Evaluation (Signed)
Anesthesia Post Note  Patient: Monica Hamilton  Procedure(s) Performed: Lumbar decompression L3-4 right, microdiscectomy L3-4 right (N/A Back)     Patient location during evaluation: PACU Anesthesia Type: General Level of consciousness: awake and alert Pain management: pain level controlled Vital Signs Assessment: post-procedure vital signs reviewed and stable Respiratory status: spontaneous breathing, nonlabored ventilation and respiratory function stable Cardiovascular status: blood pressure returned to baseline and stable Postop Assessment: no apparent nausea or vomiting Anesthetic complications: no    Last Vitals:  Vitals:   08/21/19 0945 08/21/19 1000  BP: (!) 149/71 (!) 150/68  Pulse: 79 75  Resp: 10 12  Temp:  36.4 C  SpO2: 100% 100%    Last Pain:  Vitals:   08/21/19 1000  TempSrc:   PainSc: 0-No pain                 Lynda Rainwater

## 2019-08-21 NOTE — Anesthesia Preprocedure Evaluation (Signed)
Anesthesia Evaluation  Patient identified by MRN, date of birth, ID band Patient awake    Reviewed: Allergy & Precautions, H&P , NPO status , Patient's Chart, lab work & pertinent test results, reviewed documented beta blocker date and time   Airway Mallampati: II  TM Distance: >3 FB Neck ROM: Full    Dental no notable dental hx.    Pulmonary neg pulmonary ROS,    Pulmonary exam normal breath sounds clear to auscultation       Cardiovascular hypertension, Pt. on medications and Pt. on home beta blockers Normal cardiovascular exam Rhythm:Regular Rate:Normal     Neuro/Psych negative neurological ROS  negative psych ROS   GI/Hepatic negative GI ROS, Neg liver ROS,   Endo/Other  diabetes, Type 2, Oral Hypoglycemic Agents  Renal/GU negative Renal ROS  negative genitourinary   Musculoskeletal  (+) Arthritis , Osteoarthritis,    Abdominal   Peds negative pediatric ROS (+)  Hematology negative hematology ROS (+)   Anesthesia Other Findings   Reproductive/Obstetrics negative OB ROS                             Anesthesia Physical  Anesthesia Plan  ASA: III  Anesthesia Plan: General   Post-op Pain Management:    Induction: Intravenous  PONV Risk Score and Plan: 3 and Ondansetron, Dexamethasone, Midazolam and Treatment may vary due to age or medical condition  Airway Management Planned: Oral ETT  Additional Equipment:   Intra-op Plan:   Post-operative Plan: Extubation in OR  Informed Consent: I have reviewed the patients History and Physical, chart, labs and discussed the procedure including the risks, benefits and alternatives for the proposed anesthesia with the patient or authorized representative who has indicated his/her understanding and acceptance.     Dental advisory given  Plan Discussed with: CRNA and Surgeon  Anesthesia Plan Comments:         Anesthesia Quick  Evaluation

## 2019-08-21 NOTE — Evaluation (Addendum)
Physical Therapy Evaluation Patient Details Name: Monica Hamilton MRN: [id number] DOB: Jan 16, 1941 Today's Date: 08/21/2019   History of Present Illness  Patient is 78 y.o. 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    Clinical Impression  Monica Hamilton is a 78 y.o. female POD 0 s/p  lumbar decompression and microdiscectomy L3-4 on Rt. Patient reports independence with mobility at baseline. Patient is now limited by functional impairments (see PT problem list below) and requires min guard/assist for transfers and gait with RW. Patient was able to ambulate ~90 feet with RW and minimal cues for safe use. Patient instructed in exercise to facilitate ROM and circulation to manage edema. Patient will benefit from continued skilled PT interventions to address impairments and progress towards PLOF. Acute PT will follow to progress mobility and stair training in preparation for safe discharge home.     Follow Up Recommendations Follow surgeon's recommendation for DC plan and follow-up therapies    Equipment Recommendations  Rolling walker with 5" wheels    Recommendations for Other Services       Precautions / Restrictions Precautions Precautions: Fall Restrictions Weight Bearing Restrictions: No      Mobility  Bed Mobility Overal bed mobility: Needs Assistance Bed Mobility: Supine to Sit     Supine to sit: Supervision;HOB elevated     General bed mobility comments: HOB slightly elevated, pt utilized bed rails, cues for technique with log roll to avoid excessive bending and twisting  Transfers Overall transfer level: Needs assistance Equipment used: Rolling walker (2 wheeled) Transfers: Sit to/from Stand Sit to Stand: Min guard         General transfer comment: cues for sfae hand placement and technique; guard for safety, pt able to initiate  power up without assist  Ambulation/Gait Ambulation/Gait assistance: Min assist Gait Distance (Feet): 90 Feet Assistive device: Rolling walker (2 wheeled) Gait Pattern/deviations: Step-through pattern;Decreased stride length Gait velocity: decreased   General Gait Details: p with overall steady gait, no overt LOB noted, cues for safe hand placement initially and pt maintained throughout  Stairs            Wheelchair Mobility    Modified Rankin (Stroke Patients Only)       Balance Overall balance assessment: Needs assistance Sitting-balance support: No upper extremity supported;Feet supported Sitting balance-Leahy Scale: Good     Standing balance support: During functional activity;Bilateral upper extremity supported Standing balance-Leahy Scale: Fair Standing balance comment: pt able to maintain balance without UE support in static standing                             Pertinent Vitals/Pain Pain Assessment: No/denies pain    Home Living Family/patient expects to be discharged to:: Private residence Living Arrangements: Spouse/significant other Available Help at Discharge: Family Type of Home: House Home Access: Stairs to enter Entrance Stairs-Rails: Right Entrance Stairs-Number of Steps: 3 Home Layout: One level;Laundry or work area in [employer] Equipment: Gilmer Mor - single point      Prior Function Level of Independence: Independent               Hand Dominance   Dominant Hand: Right    Extremity/Trunk Assessment   Upper Extremity Assessment Upper Extremity Assessment: Overall WFL for tasks assessed    Lower Extremity Assessment Lower Extremity Assessment: Generalized weakness  Cervical / Trunk Assessment Cervical / Trunk Assessment: Normal  Communication   Communication: No difficulties  Cognition Arousal/Alertness: Awake/alert Behavior During Therapy: WFL for tasks assessed/performed Overall Cognitive Status: Within  Functional Limits for tasks assessed             General Comments      Exercises General Exercises - Lower Extremity Ankle Circles/Pumps: Both;Seated;10 reps;AROM   Assessment/Plan    PT Assessment Patient needs continued PT services  PT Problem List Decreased strength;Decreased balance;Decreased range of motion;Decreased mobility;Decreased activity tolerance;Decreased knowledge of use of DME       PT Treatment Interventions DME instruction;Gait training;Stair training;Therapeutic exercise;Therapeutic activities;Functional mobility training;Balance training;Patient/family education    PT Goals (Current goals can be found in the Care Plan section)  Acute Rehab PT Goals Patient Stated Goal: to get back to walking without device and gardening PT Goal Formulation: With patient Time For Goal Achievement: 08/28/19 Potential to Achieve Goals: Good    Frequency Min 5X/week    AM-PAC PT "6 Clicks" Mobility  Outcome Measure Help needed turning from your back to your side while in a flat bed without using bedrails?: A Little Help needed moving from lying on your back to sitting on the side of a flat bed without using bedrails?: A Little Help needed moving to and from a bed to a chair (including a wheelchair)?: A Little Help needed standing up from a chair using your arms (e.g., wheelchair or bedside chair)?: A Little Help needed to walk in hospital room?: A Little Help needed climbing 3-5 steps with a railing? : A Little 6 Click Score: 18    End of Session Equipment Utilized During Treatment: Gait belt Activity Tolerance: Patient tolerated treatment well Patient left: with call bell/phone within reach;in chair;with chair alarm set Nurse Communication: Mobility status PT Visit Diagnosis: Unsteadiness on feet (R26.81);Muscle weakness (generalized) (M62.81);Difficulty in walking, not elsewhere classified (R26.2)    Time: 4580-9983 PT Time Calculation (min) (ACUTE ONLY): 24  min   Charges:   PT Evaluation $PT Eval Low Complexity: 1 Low PT Treatments $Gait Training: 8-22 mins        Kipp Brood, PT, DPT, [facility] Physical Therapist with [facility]  08/21/2019 2:47 PM

## 2019-08-21 NOTE — Interval H&P Note (Signed)
History and Physical Interval Note:  08/21/2019 7:05 AM  Monica Hamilton  has presented today for surgery, with the diagnosis of lumbar herniated disk L3-L4.  The various methods of treatment have been discussed with the patient and family. After consideration of risks, benefits and other options for treatment, the patient has consented to  Procedure(s) with comments: Lumbar decompression L3-4 right, microdiscectomy L3-4 right (N/A) - 44min as a surgical intervention.  The patient's history has been reviewed, patient examined, no change in status, stable for surgery.  I have reviewed the patient's chart and labs.  Questions were answered to the patient's satisfaction.     [name]

## 2019-08-21 NOTE — Brief Op Note (Signed)
08/21/2019  8:55 AM  PATIENT:  Monica Hamilton  78 y.o. female  PRE-OPERATIVE DIAGNOSIS:  lumbar herniated disk L3-L4 and Stenosis  POST-OPERATIVE DIAGNOSIS: Spinal Stenosis at L-3-L-4 and severe Lateral Recess stenosis and Foraminal Stenosis involving the L-3 and L-4 nerve roots.  PROCEDURE:  Complete Decompressive Lumbar Laminectomy and Foraminotomies at L-3-L-4,DECOMPRESSING THE l-3 AND l-4 nERVE ROOTS eXPLORATION OF THE l-3-l-4 dISC. SURGEON:  Surgeon(s) and Role:    * [name], MD - Primary  PHYSICIAN ASSISTANT: Ardeen Jourdain PA  ASSISTANTS: Ardeen Jourdain PA  ANESTHESIA:   general  EBL:  50 mL   BLOOD ADMINISTERED:none  DRAINS: none   LOCAL MEDICATIONS USED:  MARCAINE 20cc with Epinephrine at the start of the case and 20cc of Exparel at the end of the case.     SPECIMEN:  No Specimen  DISPOSITION OF SPECIMEN:  N/A  COUNTS:  YES  TOURNIQUET:  * No tourniquets in log *  DICTATION: .Other Dictation: Dictation Number [id number]  PLAN OF CARE: Admit for overnight observation  PATIENT DISPOSITION:  PACU - hemodynamically stable.   Delay start of Pharmacological VTE agent (>24hrs) due to surgical blood loss or risk of bleeding: yes

## 2019-08-21 NOTE — Op Note (Signed)
NAME: Monica Hamilton, Monica Hamilton MEDICAL RECORD [id number] ACCOUNT [id number] DATE OF BIRTH:1941/09/08 FACILITY: [facility] LOCATION: [facility] PHYSICIAN:Rodina Pinales Fransico Setters, MD  OPERATIVE REPORT  DATE OF PROCEDURE:  08/21/2019  SURGEON:  [name], MD  ASSISTANT:  Ardeen Jourdain, PA  PREOPERATIVE DIAGNOSES: 1.  Spinal stenosis at L3-L4. 2.  Herniated disk versus severe lateral recess at L3-L4 on the right.  Note:  All of her pain was on the right side, right lower extremity, and low back.  POSTOPERATIVE DIAGNOSES: 1.  Spinal stenosis, L3-L4, severe. 2.  Foraminal stenosis involving the L3 root on the right. 3.  Foraminal stenosis involving the L4 root on the right.  DESCRIPTION OF PROCEDURE:  Under general anesthesia with the patient on a Wilson frame, a routine orthopedic prep and draping of the lower back was carried out.  She had 2 g of IV Ancef.  The appropriate timeout was carried out.  I did not mark the back  on either side because I was going central.  At this particular time after the timeout and prep, etc., 2 needles were placed in the back for localization purposes.  X-ray was taken.  At this time, we verified the position.  An incision was made over the  L3-L4 space bilaterally.  The muscle was separated from the lamina and spinous process bilaterally.  At this time, another x-ray was taken with the Kocher clamps in place.  Once we isolated the L3 spinous process, we considered our dissection deeply.  I  then went down and inserted my McCulloch retractors.  Note, prior to doing this incision, I injected about 20 mL of 0.25% Marcaine with epinephrine in the soft tissue.  This was to control the bleeding.  Once the [facility] retractors were inserted.  I  then cleaned off the area again and then began my decompression centrally at L3-L4.  I went distally and proximally to this area with the dissection.  The microscope was brought in.  I gently removed the ligamentum flavum.  The area was  extremely tight  until I went distally and proximally, and then I went as far distally and proximally as necessary.  I explored the area with the hockey stick and continued the decompression until we had complete freedom proximally and distally.  I then went and did  foraminotomies as well on that right side where she was symptomatic and then explored the disk space.  Note the defect on the right was mainly from severe lateral recess stenosis.  Once we completed the decompression, I was easily able to pass the hockey  stick distally, proximally, and out laterally.  We thoroughly irrigated the area out and loosely applied some thrombin-soaked Gelfoam and then closed the wound in layers in usual fashion.  I left a small distal and proximal part of the wound open for  drainage purposes.  The remaining part of the subcu was closed in the usual fashion.  Skin was closed with metal staples after we injected 20 mL of Exparel into the muscle.  The sterile dressings were applied.  She will be admitted to the hospital  overnight for observation.  LN/NUANCE  D:08/21/2019 T:08/21/2019 JOB:[id number]

## 2019-08-21 NOTE — Interval H&P Note (Signed)
History and Physical Interval Note:  08/21/2019 7:06 AM  Monica Hamilton  has presented today for surgery, with the diagnosis of lumbar herniated disk L3-L4.  The various methods of treatment have been discussed with the patient and family. After consideration of risks, benefits and other options for treatment, the patient has consented to  Procedure(s) with comments: Lumbar decompression L3-4 right, microdiscectomy L3-4 right (N/A) - 40min as a surgical intervention.  The patient's history has been reviewed, patient examined, no change in status, stable for surgery.  I have reviewed the patient's chart and labs.  Questions were answered to the patient's satisfaction.     [name]

## 2019-08-21 NOTE — Anesthesia Procedure Notes (Signed)
Procedure Name: Intubation Date/Time: 08/21/2019 7:31 AM Performed by: Maxwell Caul, CRNA Pre-anesthesia Checklist: Patient identified, Emergency Drugs available, Suction available and Patient being monitored Patient Re-evaluated:Patient Re-evaluated prior to induction Oxygen Delivery Method: Circle system utilized Preoxygenation: Pre-oxygenation with 100% oxygen Induction Type: IV induction Ventilation: Mask ventilation without difficulty Laryngoscope Size: Mac and 4 Grade View: Grade II Tube type: Oral Tube size: 7.5 mm Number of attempts: 1 Airway Equipment and Method: Stylet Placement Confirmation: ETT inserted through vocal cords under direct vision,  positive ETCO2 and breath sounds checked- equal and bilateral Secured at: 21 cm Tube secured with: Tape Dental Injury: Teeth and Oropharynx as per pre-operative assessment

## 2019-08-22 ENCOUNTER — Encounter (HOSPITAL_COMMUNITY): Payer: Self-pay | Admitting: Orthopedic Surgery

## 2019-08-22 DIAGNOSIS — M48061 Spinal stenosis, lumbar region without neurogenic claudication: Secondary | ICD-10-CM | POA: Diagnosis not present

## 2019-08-22 DIAGNOSIS — M48062 Spinal stenosis, lumbar region with neurogenic claudication: Secondary | ICD-10-CM | POA: Diagnosis not present

## 2019-08-22 LAB — GLUCOSE, CAPILLARY
Glucose-Capillary: 164 mg/dL — ABNORMAL HIGH (ref 70–99)
Glucose-Capillary: 279 mg/dL — ABNORMAL HIGH (ref 70–99)

## 2019-08-22 MED ORDER — SODIUM CHLORIDE 0.9 % IV BOLUS
1000.0000 mL | Freq: Once | INTRAVENOUS | Status: AC
  Administered 2019-08-22: 1000 mL via INTRAVENOUS

## 2019-08-22 NOTE — Progress Notes (Addendum)
After completing PT, the pt reported feeling woozy. The pt appeared white in the face. BP was 80/45, pulse 49. Ardeen Jourdain, [place] was paged/notified.   [name], MD returned page and gave verbal orders for a bolus, see MAR. MD advised pt can be d/c home this afternoon if the pt's BP improves and is asymptomatic. I will continue to monitor.     Upon completion of the bolus, the pt's BP increased to 134/61, pulse 71. Pt asymptomatic.

## 2019-08-22 NOTE — Evaluation (Addendum)
Occupational Therapy Evaluation Patient Details Name: Monica Hamilton MRN: [id number] DOB: [birth year] Today's Date: 08/22/2019    History of Present Illness Patient is 78 y.o. 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   Clinical Impression   This 78 y/o female presents with the above. PTA pt reports independence with ADL and functional mobility. Pt just recently starting bolus due to low BP but reports feeling much better than earlier this AM. Much of session spent on educating pt re: back precautions, safety and compensatory techniques for completing ADL and functional transfers after return home while maintaining precautions. Pt able to recall/teach back education provided with min cues throughout. Reports spouse to assist as he is able after return home (reports spouse with "bad shoulders"). Suspect pt to progress well once BP continues to stabilize. She will benefit from continued OT services while in acute setting to maximize her safety and independence with ADL and mobility. Will follow.     Follow Up Recommendations  No OT follow up;Supervision/Assistance - 24 hour    Equipment Recommendations  3 in 1 bedside commode           Precautions / Restrictions Precautions Precautions: Fall;Back Precaution Booklet Issued: Yes (comment) Precaution Comments: issued and reviewed; pt able to recall 3/3 back precautions with min cues` Restrictions Weight Bearing Restrictions: No      Mobility Bed Mobility               General bed mobility comments: received OOB in recliner, verbally reviewed log roll technique   Transfers                          ADL either performed or assessed with clinical judgement   ADL Overall ADL's : Needs assistance/impaired Eating/Feeding: Independent;Sitting   Grooming: Set up;Sitting   Upper Body  Bathing: Set up;Min guard;Sitting       Upper Body Dressing : Set up;Min guard;Sitting     Lower Body Dressing Details (indicate cue type and reason): pt able to demonstrate figure 4 technique while seated in recliner; able to recall method later in session as well when reviewing           Tub/Shower Transfer Details (indicate cue type and reason): verbally reviwed shower transfers; educated pt to use 3:1 as shower chair for increased safety; also educated to use walk-in shower vs tub for increased safety with transfer   General ADL Comments: limited activity this session as pt just started bolus recently due to low BP; pt reports feeling much better already. thoroughly reviwed back precautions, safety and compensatory techniques for completing ADL and functional transfers                          Pertinent Vitals/Pain Pain Assessment: Faces Faces Pain Scale: Hurts a little bit Pain Location: incisional Pain Descriptors / Indicators: Discomfort Pain Intervention(s): Monitored during session;Repositioned;Limited activity within patient's tolerance     Hand Dominance Right   Extremity/Trunk Assessment Upper Extremity Assessment Upper Extremity Assessment: Overall WFL for tasks assessed   Lower Extremity Assessment Lower Extremity Assessment: Defer to PT evaluation       Communication Communication Communication: No difficulties   Cognition Arousal/Alertness: Awake/alert Behavior During Therapy: WFL for tasks assessed/performed Overall Cognitive Status: Within Functional Limits for tasks assessed  General Comments       Exercises     Shoulder Instructions      Home Living Family/patient expects to be discharged to:: Private residence Living Arrangements: Spouse/significant other Available Help at Discharge: Family Type of Home: House Home Access: Stairs to enter [employer] of Steps:  3 Entrance Stairs-Rails: Right Home Layout: One level;Laundry or work area in [employer] Shower/Tub: Tub/shower unit;Walk-in shower   Bathroom Toilet: Handicapped height     Home Equipment: Cane - single point          Prior Functioning/Environment Level of Independence: Independent                 OT Problem List: Decreased strength;Decreased range of motion;Decreased activity tolerance;Decreased knowledge of precautions;Pain;Decreased knowledge of use of DME or AE      OT Treatment/Interventions: Self-care/ADL training;Therapeutic exercise;DME and/or AE instruction;Therapeutic activities;Patient/family education;Balance training    OT Goals(Current goals can be found in the care plan section) Acute Rehab OT Goals Patient Stated Goal: to get back to walking without device and gardening OT Goal Formulation: With patient Time For Goal Achievement: 09/05/19 Potential to Achieve Goals: Good  OT Frequency: Min 2X/week   Barriers to D/C:            Co-evaluation              AM-PAC OT "6 Clicks" Daily Activity     Outcome Measure Help from another person eating meals?: None Help from another person taking care of personal grooming?: None Help from another person toileting, which includes using toliet, bedpan, or urinal?: A Little Help from another person bathing (including washing, rinsing, drying)?: A Little Help from another person to put on and taking off regular upper body clothing?: None Help from another person to put on and taking off regular lower body clothing?: A Little 6 Click Score: 21   End of Session Nurse Communication: Mobility status  Activity Tolerance: Patient tolerated treatment well;Other (comment)(limited activity due to recent low BP) Patient left: in chair;with call bell/phone within reach  OT Visit Diagnosis: Other abnormalities of gait and mobility (R26.89)                Time: 4174-0814 OT Time Calculation (min): 16  min Charges:  OT General Charges $OT Visit: 1 Visit OT Evaluation $OT Eval Low Complexity: [place], OT Supplemental Rehabilitation Services Pager [phone] Office [phone]   Raymondo Band 08/22/2019, 11:55 AM

## 2019-08-22 NOTE — Plan of Care (Signed)
  Problem: Education: Goal: Knowledge of General Education information will improve Description: Including pain rating scale, medication(s)/side effects and non-pharmacologic comfort measures Outcome: Progressing   Problem: Clinical Measurements: Goal: Will remain free from infection Outcome: Progressing Goal: Respiratory complications will improve Outcome: Progressing Goal: Cardiovascular complication will be avoided Outcome: Progressing   Problem: Activity: Goal: Risk for activity intolerance will decrease Outcome: Progressing   Problem: Elimination: Goal: Will not experience complications related to urinary retention Outcome: Progressing   Problem: Safety: Goal: Ability to remain free from injury will improve Outcome: Progressing

## 2019-08-22 NOTE — Progress Notes (Signed)
Pt was provided with d/c instructions. After discussing the pt's plan of care upon d/c home, the pt reported no further questions or concerns.  

## 2019-08-22 NOTE — Progress Notes (Signed)
Subjective: 1 Day Post-Op Procedure(s) (LRB): Lumbar decompression L3-4 right, microdiscectomy L3-4 right (N/A) Patient reports pain as 1 on 0-10 scaleDoing very well this morning. Will DC .    Objective: Vital signs in last 24 hours: Temp:  [97.6 F (36.4 C)-98.4 F (36.9 C)] 98 F (36.7 C) (10/01 0537) Pulse Rate:  [69-90] 72 (10/01 0537) Resp:  [10-18] 16 (10/01 0537) BP: (129-165)/(65-80) 149/69 (10/01 0537) SpO2:  [99 %-100 %] 100 % (10/01 0537) Weight:  [69 kg] 69 kg (09/30 1445)  Intake/Output from previous day: 09/30 0701 - 10/01 0700 In: 3420.3 [P.O.:582; I.V.:2688.3; IV Piggyback:150] Out: 1450 [Urine:1400; Blood:50] Intake/Output this shift: No intake/output data recorded.  No results for input(s): HGB in the last 72 hours. No results for input(s): WBC, RBC, HCT, PLT in the last 72 hours. No results for input(s): NA, K, CL, CO2, BUN, CREATININE, GLUCOSE, CALCIUM in the last 72 hours. No results for input(s): LABPT, INR in the last 72 hours.  Neurologically intact Dorsiflexion/Plantar flexion intact   Assessment/Plan: 1 Day Post-Op Procedure(s) (LRB): Lumbar decompression L3-4 right, microdiscectomy L3-4 right (N/A) Advance diet   Up with PT then DC. Office in two weeks.   Monica Hamilton 08/22/2019, 7:25 AM

## 2019-08-22 NOTE — TOC Transition Note (Signed)
Transition of Care [facility]) - CM/SW Discharge Note   Patient Details  Name: Monica Hamilton MRN: [id number] Date of Birth: [birth year]  Transition of Care [facility]) CM/SW Contact:  Lia Hopping, [place] Phone Number: 08/22/2019, 10:28 AM   Clinical Narrative:    DME-RW and 3 IN 1 ordered through [employer]    Final next level of care: Home/Self Care Barriers to Discharge: No Barriers Identified   Patient Goals and CMS Choice     Choice offered to / list presented to : NA  Discharge Placement                       Discharge Plan and Services                DME Arranged: 3-N-1, Walker rolling DME Agency: [employer] Date DME Agency Contacted: 08/22/19 Time DME Agency Contacted: [id number] Representative spoke with at DME Agency: [place] ([place]) Interventions     Readmission Risk Interventions No flowsheet data found.

## 2019-08-22 NOTE — Progress Notes (Signed)
Physical Therapy Treatment Patient Details Name: Monica Hamilton MRN: [id number] DOB: November 21, 1941 Today's Date: 08/22/2019    History of Present Illness Patient is 78 y.o. 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    PT Comments    Pt progressing well with mobility and eager for dc home.  Pt reviewed back precautions with written instruction provided.   Follow Up Recommendations  Follow surgeon's recommendation for DC plan and follow-up therapies     Equipment Recommendations  Rolling walker with 5" wheels;3in1 (PT)    Recommendations for Other Services       Precautions / Restrictions Precautions Precautions: Fall;Back Precaution Booklet Issued: Yes (comment) Precaution Comments: issued and reviewed; pt able to recall 3/3 back precautions with min cues` Restrictions Weight Bearing Restrictions: No    Mobility  Bed Mobility Overal bed mobility: Needs Assistance Bed Mobility: Rolling;Sidelying to Sit Rolling: Min guard Sidelying to sit: Min guard;Supervision       General bed mobility comments: received OOB in recliner, verbally reviewed log roll technique   Transfers Overall transfer level: Needs assistance Equipment used: Rolling walker (2 wheeled) Transfers: Sit to/from Stand Sit to Stand: Min guard;Supervision         General transfer comment: cues for safe hand placement and technique; guard for safety, pt able to initiate power up without assist  Ambulation/Gait Ambulation/Gait assistance: Min guard;Supervision Gait Distance (Feet): 450 Feet Assistive device: Rolling walker (2 wheeled);None Gait Pattern/deviations: Step-through pattern;Shuffle;Trunk flexed Gait velocity: decreased   General Gait Details: cues for posture and position from RW.  Pt ambulated 350 with RW and min guard/sup assist.  Pt ambulated additional  100' sans AD and with no LOB but with decreased speed and noted increased instability   Stairs Stairs: Yes Stairs assistance: Min guard Stair Management: One rail Right;Forwards;Step to pattern Number of Stairs: 3 General stair comments: steady assist   Wheelchair Mobility    Modified Rankin (Stroke Patients Only)       Balance Overall balance assessment: Needs assistance Sitting-balance support: No upper extremity supported;Feet supported Sitting balance-Leahy Scale: Good     Standing balance support: During functional activity;Bilateral upper extremity supported Standing balance-Leahy Scale: Fair                              Cognition Arousal/Alertness: Awake/alert Behavior During Therapy: WFL for tasks assessed/performed Overall Cognitive Status: Within Functional Limits for tasks assessed                                        Exercises General Exercises - Lower Extremity Ankle Circles/Pumps: Both;Seated;10 reps;AROM    General Comments        Pertinent Vitals/Pain Pain Assessment: Faces Pain Score: 2  Faces Pain Scale: Hurts a little bit Pain Location: incisional Pain Descriptors / Indicators: Discomfort Pain Intervention(s): Monitored during session;Repositioned;Limited activity within patient's tolerance    Home Living Family/patient expects to be discharged to:: Private residence Living Arrangements: Spouse/significant other Available Help at Discharge: Family Type of Home: House Home Access: Stairs to enter Entrance Stairs-Rails: Right Home Layout: One level;Laundry or work area in [place]: Kasandra Knudsen - single point      Prior Function Level of Independence: Independent  PT Goals (current goals can now be found in the care plan section) Acute Rehab PT Goals Patient Stated Goal: to get back to walking without device and gardening PT Goal Formulation: With patient Time For Goal Achievement:  08/28/19 Potential to Achieve Goals: Good Progress towards PT goals: Progressing toward goals    Frequency    Min 5X/week      PT Plan Current plan remains appropriate    Co-evaluation              AM-PAC PT "6 Clicks" Mobility   Outcome Measure  Help needed turning from your back to your side while in a flat bed without using bedrails?: A Little Help needed moving from lying on your back to sitting on the side of a flat bed without using bedrails?: A Little Help needed moving to and from a bed to a chair (including a wheelchair)?: A Little Help needed standing up from a chair using your arms (e.g., wheelchair or bedside chair)?: A Little Help needed to walk in hospital room?: A Little Help needed climbing 3-5 steps with a railing? : A Little 6 Click Score: 18    End of Session   Activity Tolerance: Patient tolerated treatment well Patient left: in chair;with call bell/phone within reach;with chair alarm set Nurse Communication: Mobility status PT Visit Diagnosis: Unsteadiness on feet (R26.81);Muscle weakness (generalized) (M62.81);Difficulty in walking, not elsewhere classified (R26.2)     Time: 8144-8185 PT Time Calculation (min) (ACUTE ONLY): 22 min  Charges:  $Gait Training: 8-22 mins                     Mauro Kaufmann PT Acute Rehabilitation Services Pager [phone] Office [phone]    Wendell Fiebig 08/22/2019, 12:44 PM

## 2019-08-22 NOTE — Progress Notes (Signed)
OT Cancellation Note  Patient Details Name: ZHANAE PROFFIT MRN: [id number] DOB: 1941-01-04   Cancelled Treatment:    Reason Eval/Treat Not Completed: Medical issues which prohibited therapy; pt up in chair but reporting feeling "woozy", BP checked and 80s/50s. RN present. Will hold OT eval and follow up as able.  Lou Cal, OT Supplemental Rehabilitation Services Pager [phone] Office [place] 08/22/2019, 10:46 AM

## 2019-08-23 NOTE — Discharge Summary (Signed)
Physician Discharge Summary   Patient ID: Monica Hamilton MRN: [id number] DOB/AGE: [birth year] 76 y.o.  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 No known injuries. Initially no radicular symptoms but later developed right leg pain.  Xrays showed severe disc degeneration at L5-S1. She did not improve with conservative treatment including oral prednisone, activity modification, gabapentin, and SNRB. CT myelogram showed spinal stenosis and disc herniation at L3-L4 on the right.    Laboratory Data: Hospital Outpatient Visit on 08/17/2019  Component Date Value Ref Range Status   SARS-CoV-2, NAA 08/17/2019 NOT DETECTED  NOT DETECTED Final   Comment: (NOTE) This nucleic acid amplification test was developed and its performance characteristics determined by [employer]. Nucleic acid amplification tests include PCR and TMA. This test has not been FDA cleared or approved. This test has been authorized by FDA under an Emergency Use Authorization (EUA). This test is only authorized for the duration of time the declaration that circumstances  exist justifying the authorization of the emergency use of in vitro diagnostic tests for detection of SARS-CoV-2 virus and/or diagnosis of COVID-19 infection under section 564(b)(1) of the Act, 21 U.S.C. 016WFU-9(N) (1), unless the authorization is terminated or revoked sooner. When diagnostic testing is negative, the possibility of a false negative result should be considered in the context of a patient's recent exposures and the presence of clinical signs and symptoms consistent with COVID-19. An individual without symptoms of COVID- 19 and who is not shedding SARS-CoV-2 vi                          rus would expect to have a negative (not detected) result in this assay. Performed At: [facility] [place], [place] [id number] Rush Farmer MD UR:[id number]    Coronavirus Source 08/17/2019 NASOPHARYNGEAL   Final   Performed at [facility], [place] [address], [place], [place] [address]    X-Rays:Dg Lumbar Spine 2-3 Views  Result Date: 08/15/2019 CLINICAL DATA:  Multiple disc protrusions. EXAM: LUMBAR SPINE - 2-3 VIEW COMPARISON:  Lumbar CT myelogram dated 04/29/2019 FINDINGS: There are 5 typical lumbar segments. There is disc space narrowing at L3-4 and L5-S1. Lateral alignment is normal. No significant facet arthritis. 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 Electronically Signed   By: Lorriane Shire M.D.   On: 08/15/2019 15:21   Dg Spine Portable 1 View  Result Date: 08/21/2019 CLINICAL DATA:  L4-5 laminectomy. EXAM: PORTABLE SPINE - 1 VIEW, 2 sets of images 1 set of images from 7:45 a.m.  the other from 7:57 a.m. COMPARISON:  Prior CT myelogram of 04/29/2019 and lumbar spine images of 08/15/2019 FINDINGS: Initial spinal radiograph in lateral projection shows radiopaque markers extending towards the L2 vertebral level. Spinal levels are labeled for reference. Subsequent radiograph shows radiopaque markers directed towards the L3 level. Area of  greatest disc space narrowing is seen at L3-L4 and confirmed also with comparison with previous myelogram. IMPRESSION: Spinal labeling provided for reference. Second set of images showing radiopaque markers directed towards the L3 level. Electronically Signed   By: Donzetta KohutGeoffrey  Wile M.D.   On: 08/21/2019 08:40   Dg Spine Portable 1 View  Result Date: 08/21/2019 CLINICAL DATA:  L4-5 laminectomy. EXAM: PORTABLE SPINE - 1 VIEW, 2 sets of images 1 set of images from 7:45 a.m. the other from 7:57 a.m. COMPARISON:  Prior CT myelogram of 04/29/2019 and lumbar spine images of 08/15/2019 FINDINGS: Initial spinal radiograph in lateral projection shows radiopaque markers extending towards the L2 vertebral level. Spinal levels are labeled for reference. Subsequent radiograph shows radiopaque markers directed towards the L3 level. Area of greatest disc space narrowing is seen at L3-L4 and confirmed also with comparison with previous myelogram. IMPRESSION: Spinal labeling provided for reference. Second set of images showing radiopaque markers directed towards the L3 level. Electronically Signed   By: Donzetta KohutGeoffrey  Wile M.D.   On: 08/21/2019 08:40   Dg Spine Portable 1 View  Result Date: 08/21/2019 CLINICAL DATA:  L3-4 laminectomy EXAM: PORTABLE SPINE - 1 VIEW COMPARISON:  Earlier today FINDINGS: Spinal numbering as previously established. Retractor has been placed over the L3 and L4 spinous processes with probe overlapping the posterior elements of L3. IMPRESSION: Intraoperative localization at L3-4. Electronically Signed   By: Marnee SpringJonathon  Watts M.D.   On: 08/21/2019 08:40    EKG: Orders placed or performed during the hospital encounter of 01/30/13   EKG     Hospital Course: Patient [facility] and taken to the OR and underwent the above state procedure without complications.  Patient tolerated the procedure well and was later transferred to the recovery room and then to the orthopaedic floor for  postoperative care.  They were given PO and IV analgesics for pain control following their surgery.  They were given 24 hours of postoperative antibiotics.   PT was consulted postop to assist with mobility and transfers.  The patient was allowed to be WBAT with therapy and was taught back precautions. Discharge planning was consulted to help with postop disposition and equipment needs.  Patient had a good night on the evening of surgery and started to get up OOB with therapy on day one. Patient was seen in rounds and was ready to go home on day one.  They were given discharge instructions and dressing directions.  They were instructed on when to follow up in the office with Dr. Shelle IronBeane.   Diet: Cardiac diet Activity:WBAT Follow-up:in 2 weeks Disposition - Home Discharged Condition: stable   Discharge Instructions    Call MD / Call 911   Complete by: As directed    If you experience chest pain or shortness of breath, CALL 911 and be transported to the hospital emergency room.  If you develope a fever above 101 F, pus (white drainage) or increased drainage or redness at the wound, or calf pain, call your surgeon's office.   Constipation Prevention   Complete by: As directed    Drink plenty of fluids.  Prune juice may be helpful.  You  may use a stool softener, such as Colace (over the counter) 100 mg twice a day.  Use MiraLax (over the counter) for constipation as needed.   Diet - low sodium heart healthy   Complete by: As directed    Discharge instructions   Complete by: As directed    For the first three days, remove your dressing, and tape a piece of saran wrap over your incision. Take your shower, then remove the saran wrap and put a clean dressing on. After three days you can shower without the saran wrap.  No lifting or excessive bending No driving while taking pain medications Call Dr. Darrelyn Hillock if any wound complications or temperature of 101 degrees F or over.  Call the office for an  appointment to see Dr. Darrelyn Hillock in two weeks: [phone] and ask for Dr. Jeannetta Ellis nurse, Mackey Birchwood.   Increase activity slowly as tolerated   Complete by: As directed      Allergies as of 08/22/2019      Reactions   Ace Inhibitors Cough      Medication List    TAKE these medications   amLODipine 5 MG tablet Commonly known as: NORVASC Take 5 mg by mouth daily.   aspirin 81 MG EC tablet Take 81 mg by mouth daily.   CINNAMON PO Take 1,000 mg by mouth daily.   gabapentin 300 MG capsule Commonly known as: NEURONTIN Take 600 mg by mouth at bedtime.   HYDROcodone-acetaminophen 7.5-325 MG tablet Commonly known as: NORCO Take 1 tablet by mouth every 6 (six) hours as needed for severe pain ((score 7 to 10)).   ICaps Areds 2 Caps Take 1 capsule by mouth 2 (two) times daily.   metFORMIN 1000 MG tablet Commonly known as: GLUCOPHAGE Take 1,000 mg by mouth 2 (two) times daily.   methocarbamol 500 MG tablet Commonly known as: ROBAXIN Take 1 tablet (500 mg total) by mouth every 6 (six) hours as needed for muscle spasms.   metoprolol succinate 50 MG 24 hr tablet Commonly known as: TOPROL-XL Take 50 mg by mouth daily. Take with or immediately following a meal.   valsartan-hydrochlorothiazide 160-25 MG tablet Commonly known as: DIOVAN-HCT Take 1 tablet by mouth daily.      Follow-up Information    Ranee Gosselin, MD. Schedule an appointment as soon as possible for a visit in 2 week(s).   Specialty: Orthopedic Surgery Contact information: [address] [place] [address] [place] [address] [phone]           Signed: Dimitri Ped, PA-C Orthopaedic Surgery 08/23/2019, 12:12 PM

## 2019-08-29 DIAGNOSIS — H43812 Vitreous degeneration, left eye: Secondary | ICD-10-CM | POA: Diagnosis not present

## 2019-08-29 DIAGNOSIS — H53002 Unspecified amblyopia, left eye: Secondary | ICD-10-CM | POA: Diagnosis not present

## 2019-08-29 DIAGNOSIS — E1139 Type 2 diabetes mellitus with other diabetic ophthalmic complication: Secondary | ICD-10-CM | POA: Diagnosis not present

## 2019-08-29 DIAGNOSIS — M5416 Radiculopathy, lumbar region: Secondary | ICD-10-CM | POA: Diagnosis not present

## 2019-08-29 DIAGNOSIS — I10 Essential (primary) hypertension: Secondary | ICD-10-CM | POA: Diagnosis not present

## 2019-08-29 DIAGNOSIS — Z961 Presence of intraocular lens: Secondary | ICD-10-CM | POA: Diagnosis not present

## 2019-08-29 DIAGNOSIS — E785 Hyperlipidemia, unspecified: Secondary | ICD-10-CM | POA: Diagnosis not present

## 2019-08-29 DIAGNOSIS — H3581 Retinal edema: Secondary | ICD-10-CM | POA: Diagnosis not present

## 2019-08-29 DIAGNOSIS — E1129 Type 2 diabetes mellitus with other diabetic kidney complication: Secondary | ICD-10-CM | POA: Diagnosis not present

## 2019-08-29 DIAGNOSIS — M5136 Other intervertebral disc degeneration, lumbar region: Secondary | ICD-10-CM | POA: Diagnosis not present

## 2019-09-20 DIAGNOSIS — M25551 Pain in right hip: Secondary | ICD-10-CM | POA: Diagnosis not present

## 2019-09-20 DIAGNOSIS — Z5189 Encounter for other specified aftercare: Secondary | ICD-10-CM | POA: Diagnosis not present

## 2019-10-01 DIAGNOSIS — Z5189 Encounter for other specified aftercare: Secondary | ICD-10-CM | POA: Diagnosis not present

## 2019-10-01 DIAGNOSIS — M7061 Trochanteric bursitis, right hip: Secondary | ICD-10-CM | POA: Diagnosis not present

## 2019-11-01 DIAGNOSIS — Z5189 Encounter for other specified aftercare: Secondary | ICD-10-CM | POA: Diagnosis not present

## 2019-11-01 DIAGNOSIS — M7061 Trochanteric bursitis, right hip: Secondary | ICD-10-CM | POA: Diagnosis not present

## 2019-11-05 NOTE — Progress Notes (Addendum)
[facility] Note  11/06/2019     CHIEF COMPLAINT Patient presents for Retina Follow Up   HISTORY OF PRESENT ILLNESS: Monica Hamilton is a 78 y.o. female who presents to the clinic today for:   HPI    Retina Follow Up    Patient presents with  Dry AMD.  In both eyes.  Severity is severe.  Duration of 12 months.  Since onset it is stable.  I, the attending physician,  performed the HPI with the patient and updated documentation appropriately.          Comments    12 month ret eval for Armd. Patient states there is been no change in vision since last visit.       Last edited by [name], MD on 11/07/2019  8:52 AM. (History)    pt states she is doing well, she is still taking preservision twice a day  Referring physician:   HISTORICAL INFORMATION:   Selected notes from the MEDICAL RECORD NUMBER Self referral LEE:  Ocular Hx: pseudophakia OU [name] 2019) PMH: DM (on metformin), HTN, arthritis,    CURRENT MEDICATIONS: No current outpatient medications on file. (Ophthalmic Drugs)   No current facility-administered medications for this visit. (Ophthalmic Drugs)   Current Outpatient Medications (Other)  Medication Sig  . amLODipine (NORVASC) 5 MG tablet Take 5 mg by mouth daily.  Marland Kitchen aspirin 81 MG EC tablet Take 81 mg by mouth daily.   Marland Kitchen CINNAMON PO Take 1,000 mg by mouth daily.  Marland Kitchen gabapentin (NEURONTIN) 300 MG capsule Take 600 mg by mouth at bedtime.  Marland Kitchen HYDROcodone-acetaminophen (NORCO) 7.5-325 MG tablet Take 1 tablet by mouth every 6 (six) hours as needed for severe pain ((score 7 to 10)).  Marland Kitchen metFORMIN (GLUCOPHAGE) 1000 MG tablet Take 1,000 mg by mouth 2 (two) times daily.   . methocarbamol (ROBAXIN) 500 MG tablet Take 1 tablet (500 mg total) by mouth every 6 (six) hours as needed for muscle spasms.  . metoprolol succinate (TOPROL-XL) 50 MG 24 hr tablet Take 50 mg by mouth daily. Take with or immediately following a meal.  . Multiple  Vitamins-Minerals (ICAPS AREDS 2) CAPS Take 1 capsule by mouth 2 (two) times daily.   . valsartan-hydrochlorothiazide (DIOVAN-HCT) 160-25 MG tablet Take 1 tablet by mouth daily.   No current facility-administered medications for this visit. (Other)      REVIEW OF SYSTEMS: ROS    Positive for: Endocrine, Eyes   Negative for: Constitutional, Gastrointestinal, Neurological, Skin, Genitourinary, Musculoskeletal, HENT, Cardiovascular, Respiratory, Psychiatric, Allergic/Imm, Heme/Lymph   Last edited by Elmore Guise, COT on 11/06/2019  1:24 PM. (History)       ALLERGIES Allergies  Allergen Reactions  . Ace Inhibitors Cough    PAST MEDICAL HISTORY Past Medical History:  Diagnosis Date  . Arthritis   . Colon polyp    adenomarous  . Diabetes mellitus without complication ([place])   . Diverticulosis   . Hepatomegaly   . Hyperlipidemia   . Hypertension   . Left ventricular hypertrophy   . Macular degeneration of right eye   . Mitral valve prolapse   . Perforated ulcer ([place]) 2011  . Pneumonia   . 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 ABDOMINAL HYSTERECTOMY    . APPENDECTOMY    . BLADDER SUSPENSION    . BREAST BIOPSY Left   .  BREAST EXCISIONAL BIOPSY Left   . CHOLECYSTECTOMY    . DILATION AND CURETTAGE OF UTERUS    . HEAD & NECK SKIN LESION EXCISIONAL BIOPSY     top left scalp  . LUMBAR LAMINECTOMY/DECOMPRESSION MICRODISCECTOMY N/A 08/21/2019   Procedure: Lumbar decompression L3-4 right, microdiscectomy L3-4 right;  Surgeon: [name], MD;  Location: [facility];  Service: Orthopedics;  Laterality: N/A;  7mn  . SHOULDER ARTHROSCOPY WITH SUBACROMIAL DECOMPRESSION Right 01/30/2013   Procedure: SHOULDER ARTHROSCOPY WITH SUBACROMIAL DECOMPRESSION;  Surgeon: JMagnus Sinning MD;  Location: [facility];  Service: Orthopedics;  Laterality: Right;  with labral debridement    FAMILY  HISTORY Family History  Problem Relation Age of Onset  . Heart disease Father   . Breast cancer Maternal Aunt   . Diabetes Brother   . Leukemia Sister   . Colon cancer Neg Hx   . Esophageal cancer Neg Hx   . Rectal cancer Neg Hx   . Stomach cancer Neg Hx     SOCIAL HISTORY Social History   Tobacco Use  . Smoking status: Never Smoker  . Smokeless tobacco: Never Used  Substance Use Topics  . Alcohol use: No  . Drug use: No         OPHTHALMIC EXAM:  Base Eye Exam    Visual Acuity (Snellen - Linear)      Right Left   Dist [place] 20/30-2 20/200-2   Dist ph [place] 20/25-2 20/NI       Tonometry (Tonopen, 1:26 PM)      Right Left   Pressure 20 20       Pupils      Dark Light Shape React APD   Right 3 2 Round Brisk None   Left 3 2 Round Brisk None       Visual Fields (Counting fingers)      Left Right    Full Full       Extraocular Movement      Right Left    Full, Ortho Full, Ortho       Neuro/Psych    Oriented x3: Yes   Mood/Affect: Normal       Dilation    Both eyes: 1.0% Mydriacyl, 2.5% Phenylephrine @ 1:26 PM        Slit Lamp and Fundus Exam    Slit Lamp Exam      Right Left   Lids/Lashes Dermatochalasis - upper lid Dermatochalasis - upper lid, Meibomian gland dysfunction   Conjunctiva/Sclera Nasal Pinguecula Nasal Pinguecula   Cornea 1+ diffuse Punctate epithelial erosions, Arcus, Temporal Well healed cataract wounds, Debris in tear film 1+ Punctate epithelial erosions, Arcus, Temporal Well healed cataract wounds, Debris in tear film   Anterior Chamber Deep and quiet Deep and quiet   Iris Round and moderately dilated Round and dilated   Lens PC IOL in good position, vertical PC folds PC IOL in good position, trace Posterior capsular opacification   Vitreous Vitreous syneresis Vitreous syneresis, Posterior vitreous detachment       Fundus Exam      Right Left   Disc Pink and Sharp Pink and Sharp, mild temporal Peripapillary atrophy, mild PPP   C/D  Ratio 0.4 0.4   Macula Flat, Blunted foveal reflex, RPE mottling and clumping, mild atrophy, Drusen, No heme or edema Flat, Blunted foveal reflex, RPE mottling, clumping and atrophy, Drusen, No heme or edema   Vessels Mild Vascular attenuation Mild Vascular attenuation   Periphery Attached, scattered mild Reticular degeneration Attached,  scattered Reticular degeneration          IMAGING AND PROCEDURES  Imaging and Procedures for '@TODAY'$ @  OCT, Retina - OU - Both Eyes       Right Eye Quality was good. Central Foveal Thickness: 300. Progression has been stable. Findings include normal foveal contour, no IRF, no SRF, retinal drusen , outer retinal atrophy (Mild interval increase in drusen; scattered patches of ORA).   Left Eye Quality was good. Central Foveal Thickness: 297. Progression has been stable. Findings include abnormal foveal contour, no SRF, retinal drusen , pigment epithelial detachment, intraretinal fluid (Lamellar hole nasal fovea; trace cystic changes; scattered drusen and patchy ORA -- ?mild progression).   Notes *Images captured and stored on drive  Diagnosis / Impression: Intermediate Non-Exudative ARMD OU OD: Mild interval increase in drusen; scattered patches of ORA OS: Lamellar hole nasal fovea; trace cystic changes; scattered drusen and patchy ORA -- ?mild progression  Clinical management:  See below  Abbreviations: NFP - Normal foveal profile. CME - cystoid macular edema. PED - pigment epithelial detachment. IRF - intraretinal fluid. SRF - subretinal fluid. EZ - ellipsoid zone. ERM - epiretinal membrane. ORA - outer retinal atrophy. ORT - outer retinal tubulation. SRHM - subretinal hyper-reflective material                  ASSESSMENT/PLAN:    ICD-10-CM   1. Intermediate stage nonexudative age-related macular degeneration of both eyes  H35.3132   2. Diabetes mellitus type 2 without retinopathy ([place])  E11.9   3. Amblyopia of left eye  H53.002   4.  Posterior vitreous detachment of left eye  H43.812   5. Retinal edema  H35.81 OCT, Retina - OU - Both Eyes  6. Pseudophakia of both eyes  Z96.1   7. Dry eyes  H04.123     1. Intermediate Age related macular degeneration, non-exudative, both eyes  - The incidence, anatomy, and pathology of dry AMD, risk of progression, and the AREDS and AREDS 2 study including smoking risks discussed with patient.  - exam/OCT today shows tr progression of drusen and surrounding ORA OU  - BCVA OD stable at 20/25  - continue amsler grid monitoring   - f/u 6-9 mos  2. Diabetes mellitus, type 2 without retinopathy  - The incidence, risk factors for progression, natural history and treatment options for diabetic retinopathy  were discussed with patient.    - The need for close monitoring of blood glucose, blood pressure, and serum lipids, avoiding cigarette or any type of tobacco, and the need for long term follow up was also discussed with patient.   - f/u in 1 year, sooner prn  3. Severe amblyopia OS  - history of patching as a child -- no strabismus surgery  - VA stable OS -- 20/200  4. PVD / vitreous syneresis OS  - Discussed findings and prognosis  - No RT or RD on 360 scleral depressed exam  - Reviewed s/s of RT/RD  - Strict return precautions for any such RT/RD signs/symptoms  5. No retinal edema on exam or OCT  6. Pseudophakia OU  - s/p CE/IOL OU by [name]  - beautiful surgeries, doing well  - monitor  7. Dry eyes OU - improving  - improved PEE on slit lamp exam  - likely main cause of chief complaint of burning upon waking  - cont artificial tears QID and lubricating ointment qhs as needed   Ophthalmic Meds Ordered this visit:  No orders  of the defined types were placed in this encounter.      Return for f/u 6-9 months, non-exu ARMD OU.  There are no Patient Instructions on file for this visit.   Explained the diagnoses, plan, and follow up with the patient and they expressed  understanding.  Patient expressed understanding of the importance of proper follow up care.   This document serves as a record of services personally performed by Gardiner Sleeper, MD, PhD. It was created on their behalf by Roselee Nova, COMT. The creation of this record is the provider's dictation and/or activities during the visit.  Electronically signed by: Roselee Nova, COMT 11/07/19 8:57 AM   This document serves as a record of services personally performed by Gardiner Sleeper, MD, PhD. It was created on their behalf by Ernest Mallick, OA, an ophthalmic assistant. The creation of this record is the provider's dictation and/or activities during the visit.    Electronically signed by: Ernest Mallick, OA 12.16.2020 8:57 AM  Gardiner Sleeper, M.D., Ph.D. Diseases & Surgery of the Retina and [place] 11/06/2019   I have reviewed the above documentation for accuracy and completeness, and I agree with the above. Gardiner Sleeper, M.D., Ph.D. 11/07/19 8:57 AM   Abbreviations: M myopia (nearsighted); A astigmatism; H hyperopia (farsighted); P presbyopia; Mrx spectacle prescription;  CTL contact lenses; OD right eye; OS left eye; OU both eyes  XT exotropia; ET esotropia; PEK punctate epithelial keratitis; PEE punctate epithelial erosions; DES dry eye syndrome; MGD meibomian gland dysfunction; ATs artificial tears; PFAT's preservative free artificial tears; [place] nuclear sclerotic cataract; PSC posterior subcapsular cataract; ERM epi-retinal membrane; PVD posterior vitreous detachment; RD retinal detachment; DM diabetes mellitus; DR diabetic retinopathy; NPDR non-proliferative diabetic retinopathy; PDR proliferative diabetic retinopathy; CSME clinically significant macular edema; DME diabetic macular edema; dbh dot blot hemorrhages; CWS cotton wool spot; POAG primary open angle glaucoma; C/D cup-to-disc ratio; HVF humphrey visual field; GVF goldmann visual field; OCT optical  coherence tomography; IOP intraocular pressure; BRVO Branch retinal vein occlusion; CRVO central retinal vein occlusion; CRAO central retinal artery occlusion; BRAO branch retinal artery occlusion; RT retinal tear; SB scleral buckle; PPV pars plana vitrectomy; VH Vitreous hemorrhage; PRP panretinal laser photocoagulation; IVK intravitreal kenalog; VMT vitreomacular traction; MH Macular hole;  NVD neovascularization of the disc; NVE neovascularization elsewhere; AREDS age related eye disease study; ARMD age related macular degeneration; POAG primary open angle glaucoma; EBMD epithelial/anterior basement membrane dystrophy; ACIOL anterior chamber intraocular lens; IOL intraocular lens; PCIOL posterior chamber intraocular lens; Phaco/IOL phacoemulsification with intraocular lens placement; [place] photorefractive keratectomy; LASIK laser assisted in situ keratomileusis; HTN hypertension; DM diabetes mellitus; COPD chronic obstructive pulmonary disease

## 2019-11-06 ENCOUNTER — Other Ambulatory Visit: Payer: Self-pay

## 2019-11-06 ENCOUNTER — Encounter (INDEPENDENT_AMBULATORY_CARE_PROVIDER_SITE_OTHER): Payer: Self-pay | Admitting: Ophthalmology

## 2019-11-06 ENCOUNTER — Ambulatory Visit (INDEPENDENT_AMBULATORY_CARE_PROVIDER_SITE_OTHER): Payer: Medicare HMO | Admitting: Ophthalmology

## 2019-11-06 DIAGNOSIS — H3581 Retinal edema: Secondary | ICD-10-CM | POA: Diagnosis not present

## 2019-11-06 DIAGNOSIS — E119 Type 2 diabetes mellitus without complications: Secondary | ICD-10-CM | POA: Diagnosis not present

## 2019-11-06 DIAGNOSIS — H04123 Dry eye syndrome of bilateral lacrimal glands: Secondary | ICD-10-CM

## 2019-11-06 DIAGNOSIS — H353132 Nonexudative age-related macular degeneration, bilateral, intermediate dry stage: Secondary | ICD-10-CM | POA: Diagnosis not present

## 2019-11-06 DIAGNOSIS — Z961 Presence of intraocular lens: Secondary | ICD-10-CM | POA: Diagnosis not present

## 2019-11-06 DIAGNOSIS — H53002 Unspecified amblyopia, left eye: Secondary | ICD-10-CM | POA: Diagnosis not present

## 2019-11-06 DIAGNOSIS — H43812 Vitreous degeneration, left eye: Secondary | ICD-10-CM

## 2019-11-07 ENCOUNTER — Encounter (INDEPENDENT_AMBULATORY_CARE_PROVIDER_SITE_OTHER): Payer: Self-pay | Admitting: Ophthalmology

## 2019-12-12 DIAGNOSIS — M7061 Trochanteric bursitis, right hip: Secondary | ICD-10-CM | POA: Diagnosis not present

## 2020-01-23 DIAGNOSIS — E7849 Other hyperlipidemia: Secondary | ICD-10-CM | POA: Diagnosis not present

## 2020-01-23 DIAGNOSIS — E1129 Type 2 diabetes mellitus with other diabetic kidney complication: Secondary | ICD-10-CM | POA: Diagnosis not present

## 2020-01-23 DIAGNOSIS — Z0000 Encounter for general adult medical examination without abnormal findings: Secondary | ICD-10-CM | POA: Diagnosis not present

## 2020-01-30 DIAGNOSIS — Z1339 Encounter for screening examination for other mental health and behavioral disorders: Secondary | ICD-10-CM | POA: Diagnosis not present

## 2020-01-30 DIAGNOSIS — R82998 Other abnormal findings in urine: Secondary | ICD-10-CM | POA: Diagnosis not present

## 2020-01-30 DIAGNOSIS — Z0000 Encounter for general adult medical examination without abnormal findings: Secondary | ICD-10-CM | POA: Diagnosis not present

## 2020-01-30 DIAGNOSIS — N182 Chronic kidney disease, stage 2 (mild): Secondary | ICD-10-CM | POA: Diagnosis not present

## 2020-01-30 DIAGNOSIS — E785 Hyperlipidemia, unspecified: Secondary | ICD-10-CM | POA: Diagnosis not present

## 2020-01-30 DIAGNOSIS — E1129 Type 2 diabetes mellitus with other diabetic kidney complication: Secondary | ICD-10-CM | POA: Diagnosis not present

## 2020-01-30 DIAGNOSIS — M5136 Other intervertebral disc degeneration, lumbar region: Secondary | ICD-10-CM | POA: Diagnosis not present

## 2020-01-30 DIAGNOSIS — I129 Hypertensive chronic kidney disease with stage 1 through stage 4 chronic kidney disease, or unspecified chronic kidney disease: Secondary | ICD-10-CM | POA: Diagnosis not present

## 2020-01-30 DIAGNOSIS — Z1331 Encounter for screening for depression: Secondary | ICD-10-CM | POA: Diagnosis not present

## 2020-02-13 DIAGNOSIS — I10 Essential (primary) hypertension: Secondary | ICD-10-CM | POA: Diagnosis not present

## 2020-02-14 DIAGNOSIS — Z1212 Encounter for screening for malignant neoplasm of rectum: Secondary | ICD-10-CM | POA: Diagnosis not present

## 2020-03-03 DIAGNOSIS — M7061 Trochanteric bursitis, right hip: Secondary | ICD-10-CM | POA: Diagnosis not present

## 2020-04-16 DIAGNOSIS — M7061 Trochanteric bursitis, right hip: Secondary | ICD-10-CM | POA: Diagnosis not present

## 2020-05-07 DIAGNOSIS — M25551 Pain in right hip: Secondary | ICD-10-CM | POA: Diagnosis not present

## 2020-05-13 ENCOUNTER — Other Ambulatory Visit: Payer: Self-pay | Admitting: Internal Medicine

## 2020-05-13 DIAGNOSIS — Z1231 Encounter for screening mammogram for malignant neoplasm of breast: Secondary | ICD-10-CM

## 2020-06-25 ENCOUNTER — Other Ambulatory Visit: Payer: Self-pay

## 2020-06-25 ENCOUNTER — Ambulatory Visit
Admission: RE | Admit: 2020-06-25 | Discharge: 2020-06-25 | Disposition: A | Discharge status: Home / Self Care | Payer: Medicare HMO | Source: Ambulatory Visit | Attending: Internal Medicine | Admitting: Internal Medicine

## 2020-06-25 DIAGNOSIS — Z1231 Encounter for screening mammogram for malignant neoplasm of breast: Secondary | ICD-10-CM

## 2020-07-29 DIAGNOSIS — E1129 Type 2 diabetes mellitus with other diabetic kidney complication: Secondary | ICD-10-CM | POA: Diagnosis not present

## 2020-07-29 DIAGNOSIS — I129 Hypertensive chronic kidney disease with stage 1 through stage 4 chronic kidney disease, or unspecified chronic kidney disease: Secondary | ICD-10-CM | POA: Diagnosis not present

## 2020-07-29 DIAGNOSIS — N182 Chronic kidney disease, stage 2 (mild): Secondary | ICD-10-CM | POA: Diagnosis not present

## 2020-08-05 ENCOUNTER — Encounter (INDEPENDENT_AMBULATORY_CARE_PROVIDER_SITE_OTHER): Payer: Medicare HMO | Admitting: Ophthalmology

## 2020-08-11 ENCOUNTER — Encounter (INDEPENDENT_AMBULATORY_CARE_PROVIDER_SITE_OTHER): Payer: Medicare HMO | Admitting: Ophthalmology

## 2020-08-25 NOTE — Progress Notes (Signed)
[facility] - Clinic Note  08/27/2020     CHIEF COMPLAINT Patient presents for Retina Follow Up   HISTORY OF PRESENT ILLNESS: Monica Hamilton is a 79 y.o. female who presents to the clinic today for:   HPI    Retina Follow Up    Patient presents with  Dry AMD.  In both eyes.  This started months ago.  Severity is moderate.  Duration of months.  Since onset it is stable.  I, the attending physician,  performed the HPI with the patient and updated documentation appropriately.          Comments    Pt states vision is the same OU.  Pt denies eye pain or discomfort and denies any new or worsening floaters or fol OU.       Last edited by Rennis ChrisZamora, Anjeli Casad, MD on 08/27/2020  3:19 PM. (History)    pt feels like her vision is about the same, she states her A1c was 6.2 about a month ago, she states her BP elevates every once in awhile  Referring physician:   HISTORICAL INFORMATION:   Selected notes from the MEDICAL RECORD NUMBER Self referral LEE:  Ocular Hx: pseudophakia OU [name](Shapiro 2019) PMH: DM (on metformin), HTN, arthritis,    CURRENT MEDICATIONS: No current outpatient medications on file. (Ophthalmic Drugs)   No current facility-administered medications for this visit. (Ophthalmic Drugs)   Current Outpatient Medications (Other)  Medication Sig  . amLODipine (NORVASC) 5 MG tablet Take 5 mg by mouth daily.  Marland Kitchen. aspirin 81 MG EC tablet Take 81 mg by mouth daily.   Marland Kitchen. CINNAMON PO Take 1,000 mg by mouth daily.  Marland Kitchen. gabapentin (NEURONTIN) 300 MG capsule Take 600 mg by mouth at bedtime.  Marland Kitchen. HYDROcodone-acetaminophen (NORCO) 7.5-325 MG tablet Take 1 tablet by mouth every 6 (six) hours as needed for severe pain ((score 7 to 10)).  Marland Kitchen. metFORMIN (GLUCOPHAGE) 1000 MG tablet Take 1,000 mg by mouth 2 (two) times daily.   . methocarbamol (ROBAXIN) 500 MG tablet Take 1 tablet (500 mg total) by mouth every 6 (six) hours as needed for muscle spasms.  . metoprolol succinate  (TOPROL-XL) 50 MG 24 hr tablet Take 50 mg by mouth daily. Take with or immediately following a meal.  . Multiple Vitamins-Minerals (ICAPS AREDS 2) CAPS Take 1 capsule by mouth 2 (two) times daily.   . valsartan-hydrochlorothiazide (DIOVAN-HCT) 160-25 MG tablet Take 1 tablet by mouth daily.   No current facility-administered medications for this visit. (Other)      REVIEW OF SYSTEMS: ROS    Positive for: Endocrine, Eyes   Negative for: Constitutional, Gastrointestinal, Neurological, Skin, Genitourinary, Musculoskeletal, HENT, Cardiovascular, Respiratory, Psychiatric, Allergic/Imm, Heme/Lymph   Last edited by Corrinne EagleEnglish, Ashley L on 08/27/2020  7:56 AM. (History)       ALLERGIES Allergies  Allergen Reactions  . Ace Inhibitors Cough    PAST MEDICAL HISTORY Past Medical History:  Diagnosis Date  . Arthritis   . Colon polyp    adenomarous  . Diabetes mellitus without complication (HCC)   . Diverticulosis   . Hepatomegaly   . Hyperlipidemia   . Hypertension   . Left ventricular hypertrophy   . Macular degeneration of right eye   . Mitral valve prolapse   . Perforated ulcer (HCC) 2011  . Pneumonia   . 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  ABDOMINAL HYSTERECTOMY    . APPENDECTOMY    . BLADDER SUSPENSION    . BREAST BIOPSY Left   . BREAST EXCISIONAL BIOPSY Left   . CHOLECYSTECTOMY    . DILATION AND CURETTAGE OF UTERUS    . HEAD & NECK SKIN LESION EXCISIONAL BIOPSY     top left scalp  . LUMBAR LAMINECTOMY/DECOMPRESSION MICRODISCECTOMY N/A 08/21/2019   Procedure: Lumbar decompression L3-4 right, microdiscectomy L3-4 right;  Surgeon: Ranee Gosselin, MD;  Location: [facility];  Service: Orthopedics;  Laterality: N/A;   . SHOULDER ARTHROSCOPY WITH SUBACROMIAL DECOMPRESSION Right 01/30/2013   Procedure: SHOULDER ARTHROSCOPY WITH SUBACROMIAL DECOMPRESSION;  Surgeon: Drucilla Schmidt,  MD;  Location: [facility];  Service: Orthopedics;  Laterality: Right;  with labral debridement    FAMILY HISTORY Family History  Problem Relation Age of Onset  . Heart disease Father   . Breast cancer Maternal Aunt   . Diabetes Brother   . Leukemia Sister   . Colon cancer Neg Hx   . Esophageal cancer Neg Hx   . Rectal cancer Neg Hx   . Stomach cancer Neg Hx     SOCIAL HISTORY Social History   Tobacco Use  . Smoking status: Never Smoker  . Smokeless tobacco: Never Used  Vaping Use  . Vaping Use: Never used  Substance Use Topics  . Alcohol use: No  . Drug use: No         OPHTHALMIC EXAM:  Base Eye Exam    Visual Acuity (Snellen - Linear)      Right Left   Dist [place] 20/30 -2 20/200 -1   Dist ph [place] 20/30 +1 NI       Tonometry (Tonopen, 7:56 AM)      Right Left   Pressure 18 16       Pupils      Dark Light Shape React APD   Right 2 1 Round Brisk 0   Left 2 1 Round Brisk 0       Visual Fields      Left Right    Full Full       Extraocular Movement      Right Left    Full Full       Neuro/Psych    Oriented x3: Yes   Mood/Affect: Normal       Dilation    Both eyes: 1.0% Mydriacyl, 2.5% Phenylephrine @ 7:56 AM        Slit Lamp and Fundus Exam    Slit Lamp Exam      Right Left   Lids/Lashes Dermatochalasis - upper lid, mild Meibomian gland dysfunction Dermatochalasis - upper lid, Meibomian gland dysfunction   Conjunctiva/Sclera Nasal Pinguecula Nasal Pinguecula   Cornea 1+Punctate epithelial erosions, Arcus, Temporal Well healed cataract wounds 1+ Punctate epithelial erosions, Arcus, Temporal Well healed cataract wounds   Anterior Chamber Deep and quiet Deep and quiet   Iris Round and moderately dilated Round and dilated   Lens PC IOL in good position, trace PCO PC IOL in good position, trace Posterior capsular opacification   Vitreous Vitreous syneresis Vitreous syneresis, Posterior vitreous detachment       Fundus Exam      Right Left   Disc  Pink and Sharp Pink and Sharp, mild temporal Peripapillary atrophy, mild PPP   C/D Ratio 0.4 0.4   Macula Flat, Blunted foveal reflex, RPE mottling and clumping, mild atrophy, Drusen, rare MA/IRH, trace cystic changes, No edema Flat, Blunted foveal reflex, RPE  mottling, clumping and atrophy, Drusen, trace cystic changes, No heme   Vessels attenuated, mild tortuousity attenuated, mild tortuousity   Periphery Attached, scattered mild Reticular degeneration Attached, scattered Reticular degeneration          IMAGING AND PROCEDURES  Imaging and Procedures for @TODAY @  OCT, Retina - OU - Both Eyes       Right Eye Quality was good. Central Foveal Thickness: 309. Progression has worsened. Findings include normal foveal contour, no SRF, retinal drusen , outer retinal atrophy, intraretinal fluid (stable drusen; interval development of central cystic changes).   Left Eye Quality was good. Central Foveal Thickness: 307. Progression has worsened. Findings include abnormal foveal contour, no SRF, retinal drusen , pigment epithelial detachment, intraretinal fluid, outer retinal atrophy (Mild interval increase in central IRF/cystic changes).   Notes *Images captured and stored on drive  Diagnosis / Impression: Intermediate Non-Exudative ARMD OU OD: stable drusen; interval development of central cystic changes OS: Mild interval increase in central IRF/cystic changes  Clinical management:  See below  Abbreviations: NFP - Normal foveal profile. CME - cystoid macular edema. PED - pigment epithelial detachment. IRF - intraretinal fluid. SRF - subretinal fluid. EZ - ellipsoid zone. ERM - epiretinal membrane. ORA - outer retinal atrophy. ORT - outer retinal tubulation. SRHM - subretinal hyper-reflective material                  ASSESSMENT/PLAN:    ICD-10-CM   1. Intermediate stage nonexudative age-related macular degeneration of both eyes  H35.3132   2. Both eyes affected by mild  nonproliferative diabetic retinopathy with macular edema, associated with type 2 diabetes mellitus (HCC)    3. Amblyopia of left eye  H53.002   4. Posterior vitreous detachment of left eye  H43.812   5. Retinal edema  H35.81 OCT, Retina - OU - Both Eyes  6. Pseudophakia of both eyes  Z96.1   7. Dry eyes  H04.123     1. Intermediate Age related macular degeneration, non-exudative, both eyes -- stable  - The incidence, anatomy, and pathology of dry AMD, risk of progression, and the AREDS and AREDS 2 study including smoking risks discussed with patient.  - exam/OCT today shows OD: stable drusen; interval development of central cystic changes; OS: Mild interval increase in central IRF/cystic changes  - suspect cystic changes mostly related to DM and HTN  - BCVA OD slightly decreased to 20/30 from 20/25  - continue amsler grid monitoring   - f/u 2-3 months, DFE, OCT, FA (optos, transit OD)  2. Mild non-proliferative diabetic retinopathy, both eyes - The incidence, risk factors for progression, natural history and treatment options for diabetic retinopathy were discussed with patient.   - The need for close monitoring of blood glucose, blood pressure, and serum lipids, avoiding cigarette or any type of tobacco, and the need for long term follow up was also discussed with patient. - exam shows rare MA/IRH - OCT shows mild diabetic macular edema/cystic changes, both eyes  The natural history, pathology, and characteristics of diabetic macular edema discussed with patient.  A generalized discussion of the major clinical trials concerning treatment of diabetic macular edema (ETDRS, DCT, SCORE, RISE / RIDE, and ongoing DRCR net studies) was completed.  This discussion included mention of the various approaches to treating diabetic macular edema (observation, laser photocoagulation, anti-VEGF injections with lucentis / Avastin / Eylea, steroid injections with Kenalog / Ozurdex, and intraocular  surgery with vitrectomy).  The goal hemoglobin A1C of 6-7  was discussed, as well as importance of smoking cessation and hypertension control.  Need for ongoing treatment and monitoring were specifically discussed with reference to chronic nature of diabetic macular edema. - BCVA remains stable OU at 20/30 OD, and 20/200 OS (amblyopia) - no treatment recommended at this time -- will monitor closely - f/u in 2-3 mos -- DFE/OCT, FA transit OD, possible injection  3. Severe amblyopia OS  - history of patching as a child -- no strabismus surgery  - VA stable OS -- 20/200  4. PVD / vitreous syneresis OS  - Discussed findings and prognosis  - No RT or RD on 360 scleral depressed exam  - Reviewed s/s of RT/RD  - Strict return precautions for any such RT/RD signs/symptoms  5. No retinal edema on exam or OCT  6. Pseudophakia OU  - s/p CE/IOL OU by [name]  - beautiful surgeries, doing well  - monitor  7. Dry eyes OU - improving  - improved PEE on slit lamp exam  - likely main cause of chief complaint of burning upon waking  - cont artificial tears QID and lubricating ointment qhs as needed   Ophthalmic Meds Ordered this visit:  No orders of the defined types were placed in this encounter.      Return for f/u 2-3 months, non-exu ARMD OU, DFE, OCT, FA.  There are no Patient Instructions on file for this visit.   Explained the diagnoses, plan, and follow up with the patient and they expressed understanding.  Patient expressed understanding of the importance of proper follow up care.   This document serves as a record of services personally performed by Karie Chimera, MD, PhD. It was created on their behalf by Joni Reining, an ophthalmic technician. The creation of this record is the provider's dictation and/or activities during the visit.    Electronically signed by: Joni Reining COA, 08/27/20  3:26 PM   This document serves as a record of services personally performed by Karie Chimera, MD, PhD. It was created on their behalf by Glee Arvin. Manson Passey, OA an ophthalmic technician. The creation of this record is the provider's dictation and/or activities during the visit.    Electronically signed by: Glee Arvin. Manson Passey, [place] 10.07.2021 3:26 PM  Karie Chimera, M.D., Ph.D. Diseases & Surgery of the Retina and Vitreous [facility]  I have reviewed the above documentation for accuracy and completeness, and I agree with the above. Karie Chimera, M.D., Ph.D. 08/27/20 3:26 PM  Abbreviations: M myopia (nearsighted); A astigmatism; H hyperopia (farsighted); P presbyopia; Mrx spectacle prescription;  CTL contact lenses; OD right eye; OS left eye; OU both eyes  XT exotropia; ET esotropia; PEK punctate epithelial keratitis; PEE punctate epithelial erosions; DES dry eye syndrome; MGD meibomian gland dysfunction; ATs artificial tears; PFAT's preservative free artificial tears; NSC nuclear sclerotic cataract; PSC posterior subcapsular cataract; ERM epi-retinal membrane; PVD posterior vitreous detachment; RD retinal detachment; DM diabetes mellitus; DR diabetic retinopathy; NPDR non-proliferative diabetic retinopathy; PDR proliferative diabetic retinopathy; CSME clinically significant macular edema; DME diabetic macular edema; dbh dot blot hemorrhages; CWS cotton wool spot; POAG primary open angle glaucoma; C/D cup-to-disc ratio; HVF humphrey visual field; GVF goldmann visual field; OCT optical coherence tomography; IOP intraocular pressure; BRVO Branch retinal vein occlusion; CRVO central retinal vein occlusion; CRAO central retinal artery occlusion; BRAO branch retinal artery occlusion; RT retinal tear; SB scleral buckle; PPV pars plana vitrectomy; VH Vitreous hemorrhage; PRP panretinal laser photocoagulation;  IVK intravitreal kenalog; VMT vitreomacular traction; MH Macular hole;  NVD neovascularization of the disc; NVE neovascularization elsewhere; AREDS age related eye disease  study; ARMD age related macular degeneration; POAG primary open angle glaucoma; EBMD epithelial/anterior basement membrane dystrophy; ACIOL anterior chamber intraocular lens; IOL intraocular lens; PCIOL posterior chamber intraocular lens; Phaco/IOL phacoemulsification with intraocular lens placement; [place] photorefractive keratectomy; LASIK laser assisted in situ keratomileusis; HTN hypertension; DM diabetes mellitus; COPD chronic obstructive pulmonary disease

## 2020-08-27 ENCOUNTER — Ambulatory Visit (INDEPENDENT_AMBULATORY_CARE_PROVIDER_SITE_OTHER): Payer: Medicare HMO | Admitting: Ophthalmology

## 2020-08-27 ENCOUNTER — Other Ambulatory Visit: Payer: Self-pay

## 2020-08-27 ENCOUNTER — Encounter (INDEPENDENT_AMBULATORY_CARE_PROVIDER_SITE_OTHER): Payer: Self-pay | Admitting: Ophthalmology

## 2020-08-27 ENCOUNTER — Encounter (INDEPENDENT_AMBULATORY_CARE_PROVIDER_SITE_OTHER): Payer: Medicare HMO | Admitting: Ophthalmology

## 2020-08-27 DIAGNOSIS — R69 Illness, unspecified: Secondary | ICD-10-CM | POA: Diagnosis not present

## 2020-08-27 DIAGNOSIS — H3581 Retinal edema: Secondary | ICD-10-CM | POA: Diagnosis not present

## 2020-08-27 DIAGNOSIS — H04123 Dry eye syndrome of bilateral lacrimal glands: Secondary | ICD-10-CM

## 2020-08-27 DIAGNOSIS — H353132 Nonexudative age-related macular degeneration, bilateral, intermediate dry stage: Secondary | ICD-10-CM

## 2020-08-27 DIAGNOSIS — E113213 Type 2 diabetes mellitus with mild nonproliferative diabetic retinopathy with macular edema, bilateral: Secondary | ICD-10-CM

## 2020-08-27 DIAGNOSIS — Z961 Presence of intraocular lens: Secondary | ICD-10-CM | POA: Diagnosis not present

## 2020-08-27 DIAGNOSIS — H53002 Unspecified amblyopia, left eye: Secondary | ICD-10-CM | POA: Diagnosis not present

## 2020-08-27 DIAGNOSIS — H43812 Vitreous degeneration, left eye: Secondary | ICD-10-CM | POA: Diagnosis not present

## 2020-09-03 NOTE — Progress Notes (Deleted)
[facility]    Provider:  Dr Lucia Gaskins Requesting Provider: Ranee Gosselin, MD Primary Care Provider:  Geoffry Paradise, MD  CC:  ***  HPI:  Monica Hamilton is a 79 y.o. female here as requested by Ranee Gosselin, MD for pain in lumbar spine. PMHx   I reviewed Dr. Jeannetta Ellis notes: Patient last reported she had pain in the hip running down the front of the leg, trouble walking when she gets out of bed, tingling in the knees, she takes 2 Tylenol arthritis twice a day to help alleviate her symptoms, when she was taking prednisone it helped significantly but the pain returned when she ran out, but he also reported remarkably improved, 1 gabapentin at night, the physical exam of her back looked fine, very minimal pain in the SI joint, in the supine position the range of motion of her hips are normal, straight leg raise is negative bilaterally, neurologically she is fine, very minimal pain over the greater trochanter bursa.   Reviewed notes, labs and imaging from outside physicians, which showed ***  Review of Systems: Patient complains of symptoms per HPI as well as the following symptoms ***. Pertinent negatives and positives per HPI. All others negative.   Social History   Socioeconomic History  . Marital status: Married    Spouse name: Not on file  . Number of children: 1  . Years of education: Not on file  . Highest education level: Not on file  Occupational History  . Occupation: retired  Tobacco Use  . Smoking status: Never Smoker  . Smokeless tobacco: Never Used  Vaping Use  . Vaping Use: Never used  Substance and Sexual Activity  . Alcohol use: No  . Drug use: No  . Sexual activity: Not on file  Other Topics Concern  . Not on file  Social History Narrative  . Not on file   Social Determinants of Health   Financial Resource Strain:   . Difficulty of Paying Living Expenses: Not on file  Food Insecurity:   . Worried About Programme researcher, broadcasting/film/video in the  Last Year: Not on file  . Ran Out of Food in the Last Year: Not on file  Transportation Needs:   . Lack of Transportation (Medical): Not on file  . Lack of Transportation (Non-Medical): Not on file  Physical Activity:   . Days of Exercise per Week: Not on file  . Minutes of Exercise per Session: Not on file  Stress:   . Feeling of Stress : Not on file  Social Connections:   . Frequency of Communication with Friends and Family: Not on file  . Frequency of Social Gatherings with Friends and Family: Not on file  . Attends Religious Services: Not on file  . Active Member of Clubs or Organizations: Not on file  . Attends Banker Meetings: Not on file  . Marital Status: Not on file  Intimate Partner Violence:   . Fear of Current or Ex-Partner: Not on file  . Emotionally Abused: Not on file  . Physically Abused: Not on file  . Sexually Abused: Not on file    Family History  Problem Relation Age of Onset  . Heart disease Father   . Breast cancer Maternal Aunt   . Diabetes Brother   . Leukemia Sister   . Colon cancer Neg Hx   . Esophageal cancer Neg Hx   . Rectal cancer Neg Hx   . Stomach cancer Neg Hx  Past Medical History:  Diagnosis Date  . Arthritis   . Colon polyp    adenomarous  . Diabetes mellitus without complication (HCC)   . Diverticulosis   . Hepatomegaly   . Hyperlipidemia   . Hypertension   . Left ventricular hypertrophy   . Macular degeneration of right eye   . Mitral valve prolapse   . Perforated ulcer (HCC) 2011  . Pneumonia   . 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 Spinal stenosis, lumbar region with neurogenic claudication 08/21/2019    Past Surgical History:  Procedure Laterality Date  . ABDOMINAL HYSTERECTOMY    . APPENDECTOMY    . BLADDER SUSPENSION    . BREAST BIOPSY Left   . BREAST EXCISIONAL BIOPSY Left   . CHOLECYSTECTOMY      . DILATION AND CURETTAGE OF UTERUS    . HEAD & NECK SKIN LESION EXCISIONAL BIOPSY     top left scalp  . LUMBAR LAMINECTOMY/DECOMPRESSION MICRODISCECTOMY N/A 08/21/2019   Procedure: Lumbar decompression L3-4 right, microdiscectomy L3-4 right;  Surgeon: Ranee Gosselin, MD;  Location: [facility];  Service: Orthopedics;  Laterality: N/A;   . SHOULDER ARTHROSCOPY WITH SUBACROMIAL DECOMPRESSION Right 01/30/2013   Procedure: SHOULDER ARTHROSCOPY WITH SUBACROMIAL DECOMPRESSION;  Surgeon: Drucilla Schmidt, MD;  Location: [facility];  Service: Orthopedics;  Laterality: Right;  with labral debridement    Current Outpatient Medications  Medication Sig Dispense Refill  . amLODipine (NORVASC) 5 MG tablet Take 5 mg by mouth daily.    Marland Kitchen aspirin 81 MG EC tablet Take 81 mg by mouth daily.     Marland Kitchen CINNAMON PO Take 1,000 mg by mouth daily.    Marland Kitchen gabapentin (NEURONTIN) 300 MG capsule Take 600 mg by mouth at bedtime.    Marland Kitchen HYDROcodone-acetaminophen (NORCO) 7.5-325 MG tablet Take 1 tablet by mouth every 6 (six) hours as needed for severe pain ((score 7 to 10)). 42 tablet 0  . metFORMIN (GLUCOPHAGE) 1000 MG tablet Take 1,000 mg by mouth 2 (two) times daily.     . methocarbamol (ROBAXIN) 500 MG tablet Take 1 tablet (500 mg total) by mouth every 6 (six) hours as needed for muscle spasms. 40 tablet 0  . metoprolol succinate (TOPROL-XL) 50 MG 24 hr tablet Take 50 mg by mouth daily. Take with or immediately following a meal.    . Multiple Vitamins-Minerals (ICAPS AREDS 2) CAPS Take 1 capsule by mouth 2 (two) times daily.     . valsartan-hydrochlorothiazide (DIOVAN-HCT) 160-25 MG tablet Take 1 tablet by mouth daily.     No current facility-administered medications for this visit.    Allergies as of 09/07/2020 - Review Complete 08/27/2020  Allergen Reaction Noted  . Ace inhibitors Cough 01/02/2015    Vitals: There were no vitals taken for this visit. Last Weight:  Wt Readings from Last 1 Encounters:  08/21/19 152 lb  1.9 oz (69 kg)   Last Height:   Ht Readings from Last 1 Encounters:  08/21/19 5\' 7"  (1.702 m)     Physical exam: Exam: Gen: NAD, conversant, well nourised, obese, well groomed                     CV: RRR, no MRG. No Carotid Bruits. No peripheral edema, warm, nontender Eyes: Conjunctivae clear without exudates or hemorrhage  Neuro: Detailed Neurologic Exam  Speech:  Speech is normal; fluent and spontaneous with normal comprehension.  Cognition:    The patient is oriented to person, place, and time;     recent and remote memory intact;     language fluent;     normal attention, concentration,     fund of knowledge Cranial Nerves:    The pupils are equal, round, and reactive to light. The fundi are normal and spontaneous venous pulsations are present. Visual fields are full to finger confrontation. Extraocular movements are intact. Trigeminal sensation is intact and the muscles of mastication are normal. The face is symmetric. The palate elevates in the midline. Hearing intact. Voice is normal. Shoulder shrug is normal. The tongue has normal motion without fasciculations.   Coordination:    Normal finger to nose and heel to shin. Normal rapid alternating movements.   Gait:    Heel-toe and tandem gait are normal.   Motor Observation:    No asymmetry, no atrophy, and no involuntary movements noted. Tone:    Normal muscle tone.    Posture:    Posture is normal. normal erect    Strength:    Strength is V/V in the upper and lower limbs.      Sensation: intact to LT     Reflex Exam:  DTR's:    Deep tendon reflexes in the upper and lower extremities are normal bilaterally.   Toes:    The toes are downgoing bilaterally.   Clonus:    Clonus is absent.    Assessment/Plan:    No orders of the defined types were placed in this encounter.  No orders of the defined types were placed in this encounter.   Cc: Ranee Gosselin, MD,  Geoffry Paradise, MD  Naomie Dean,  MD  [facility] [address] [place], [place] [address]  Phone [phone] Fax [phone]

## 2020-09-07 ENCOUNTER — Ambulatory Visit: Payer: Medicare HMO | Admitting: Neurology

## 2020-09-09 ENCOUNTER — Ambulatory Visit: Payer: Medicare HMO | Admitting: Neurology

## 2020-09-09 ENCOUNTER — Other Ambulatory Visit: Payer: Self-pay

## 2020-09-09 ENCOUNTER — Encounter: Payer: Self-pay | Admitting: Neurology

## 2020-09-09 VITALS — BP 144/74 | HR 67 | Ht 66.0 in | Wt 147.0 lb

## 2020-09-09 DIAGNOSIS — R202 Paresthesia of skin: Secondary | ICD-10-CM | POA: Diagnosis not present

## 2020-09-09 DIAGNOSIS — M79604 Pain in right leg: Secondary | ICD-10-CM | POA: Diagnosis not present

## 2020-09-09 DIAGNOSIS — R200 Anesthesia of skin: Secondary | ICD-10-CM

## 2020-09-09 DIAGNOSIS — R29898 Other symptoms and signs involving the musculoskeletal system: Secondary | ICD-10-CM | POA: Diagnosis not present

## 2020-09-09 DIAGNOSIS — M5417 Radiculopathy, lumbosacral region: Secondary | ICD-10-CM | POA: Insufficient documentation

## 2020-09-09 NOTE — Patient Instructions (Addendum)
MRI lumbar spine   Lumbosacral Radiculopathy Lumbosacral radiculopathy is a condition that involves the spinal nerves and nerve roots in the low back and bottom of the spine. The condition develops when these nerves and nerve roots move out of place or become inflamed and cause symptoms. What are the causes? This condition may be caused by:  Pressure from a disk that bulges out of place (herniated disk). A disk is a plate of soft cartilage that separates bones in the spine.  Disk changes that occur with age (disk degeneration).  A narrowing of the bones of the lower back (spinal stenosis).  A tumor.  An infection.  An injury that places sudden pressure on the disks that cushion the bones of your lower spine. What increases the risk? You are more likely to develop this condition if:  You are a female who is 62-32 years old.  You are a female who is 23-70 years old.  You use improper technique when lifting things.  You are overweight or live a sedentary lifestyle.  Your work requires frequent lifting.  You smoke.  You do repetitive activities that strain the spine. What are the signs or symptoms? Symptoms of this condition include:  Pain that goes down from your back into your legs (sciatica), usually on one side of the body. This is the most common symptom. The pain may be worse with sitting, coughing, or sneezing.  Pain and numbness in your legs.  Muscle weakness.  Tingling.  Loss of bladder control or bowel control. How is this diagnosed? This condition may be diagnosed based on:  Your symptoms and medical history.  A physical exam. If the pain is lasting, you may have tests, such as:  MRI scan.  X-ray.  CT scan.  A type of X-ray used to examine the spinal canal after injecting a dye into your spine (myelogram).  A test to measure how electrical impulses move through a nerve (nerve conduction study). How is this treated? Treatment may depend on the  cause of the condition and may include:  Working with a physical therapist.  Taking pain medicine.  Applying heat and ice to affected areas.  Doing stretches to improve flexibility.  Doing exercises to strengthen back muscles.  Having chiropractic spinal manipulation.  Using transcutaneous electrical nerve stimulation (TENS) therapy.  Getting a steroid injection in the spine. In some cases, no treatment is needed. If the condition is long-lasting (chronic), or if symptoms are severe, treatment may involve surgery or lifestyle changes, such as following a weight-loss plan. Follow these instructions at home: Activity  Avoid bending and other activities that make the problem worse.  Maintain a proper position when standing or sitting: ? When standing, keep your upper back and neck straight, with your shoulders pulled back. Avoid slouching. ? When sitting, keep your back straight and relax your shoulders. Do not round your shoulders or pull them backward.  Do not sit or stand in one place for long periods of time.  Take brief periods of rest throughout the day. This will reduce your pain. It is usually better to rest by lying down or standing, not sitting.  When you are resting for longer periods, mix in some mild activity or stretching between periods of rest. This will help to prevent stiffness and pain.  Get regular exercise. Ask your health care provider what activities are safe for you. If you were shown how to do any exercises or stretches, do them as directed by your  health care provider.  Do not lift anything that is heavier than 10 lb (4.5 kg) or the limit that you are told by your health care provider. Always use proper lifting technique, which includes: ? Bending your knees. ? Keeping the load close to your body. ? Avoiding twisting. Managing pain  If directed, put ice on the affected area: ? Put ice in a plastic bag. ? Place a towel between your skin and the  bag. ? Leave the ice on for 20 minutes, 2-3 times a day.  If directed, apply heat to the affected area as often as told by your health care provider. Use the heat source that your health care provider recommends, such as a moist heat pack or a heating pad. ? Place a towel between your skin and the heat source. ? Leave the heat on for 20-30 minutes. ? Remove the heat if your skin turns bright red. This is especially important if you are unable to feel pain, heat, or cold. You may have a greater risk of getting burned.  Take over-the-counter and prescription medicines only as told by your health care provider. General instructions  Sleep on a firm mattress in a comfortable position. Try lying on your side with your knees slightly bent. If you lie on your back, put a pillow under your knees.  Do not drive or use heavy machinery while taking prescription pain medicine.  If your health care provider prescribed a diet or exercise program, follow it as directed.  Keep all follow-up visits as told by your health care provider. This is important. Contact a health care provider if:  Your pain does not improve over time, even when taking pain medicines. Get help right away if:  You develop severe pain.  Your pain suddenly gets worse.  You develop increasing weakness in your legs.  You lose the ability to control your bladder or bowel.  You have difficulty walking or balancing.  You have a fever. Summary  Lumbosacral radiculopathy is a condition that occurs when the spinal nerves and nerve roots in the lower part of the spine move out of place or become inflamed and cause symptoms.  Symptoms include pain, numbness, and tingling that go down from your back into your legs (sciatica), muscle weakness, and loss of bladder control or bowel control.  If directed, apply ice or heat to the affected area as told by your health care provider.  Follow instructions about activity, rest, and proper  lifting technique. This information is not intended to replace advice given to you by your health care provider. Make sure you discuss any questions you have with your health care provider. Document Revised: 10/26/2017 Document Reviewed: 10/26/2017 Elsevier Patient Education  2020 [employer].

## 2020-09-09 NOTE — Progress Notes (Signed)
[facility]    Provider:  Dr Lucia GaskinsAhern Requesting Provider: Ranee Gosselinonald Gioffre, MD Primary Care Provider:  Geoffry ParadiseAronson, Richard, MD  CC:  Back pain  HPI:  Monica Hamilton is a [age] y.o. female here as requested by Ranee Gosselinonald Gioffre, MD for lumbar pain. PMHx lumbar decompression L3/L4 right microdiscectomy.   Patient is here with the same symptoms she saw Dr. Darrelyn HillockGioffre for, right sided pain, radiating down the right lateral thigh to the anterior lower leg. She has had several injections that help temporarily but the pain continues, worse in the morning. She had lower back pain in 2020, but it did not radiate into the legs. Had the surgery decompression L3/L4 08/2019. The surgery helped but she started having a new problem per patient. She started having new pain a few months after the surgery, she had shots by Dr. Lerry PatersonGiofre and Dr. Ethelene Halamos which helped temporarily/ When she gets up in the mornings she can hardly walk. It starts lateral thigh and radiates to the front of the calf. A numb feeling. Severe shooting pain. Down the lateral thigh to the front of the leg. Like something sticking her from inside and numb. Didn't have it prior to surgery and believes it is new. She has right leg weakness. No falls. She has had her hips evaluated. Pain is worsening becoming more frequent. No other focal neurologic deficits, associated symptoms, inciting events or modifiable factors.  Reviewed notes, labs and imaging from outside physicians, which showed: see above  XR lumbar spine reviewed images and agree with the following: There are 5 typical lumbar segments. There is disc space narrowing at L3-4 and L5-S1. Lateral alignment is normal. No significant facet arthritis.  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  Patient is here from Orthopaedics for back pain.  Patient saw Dr. Hinton RaoGioffre,i reviewed his notes:  he stated the physical exam of her back looks fine, she has some occasional radicular  leg pain "on occasion", complete range of motion, very minimal pain in the SI joint, range of motion of her hips are normal, straight leg raise is negative bilaterally, neurologically she is fine, very minimal pain over the greater trochanteric bursa.  Neurologically she appears fine so she was sent to neurology for evaluation.   An MRI of her back was suggested, also a selective nerve root block at L3-L4 on the right. A CT Myelogram was completed in the past.    Review of Systems: Patient complains of symptoms per HPI as well as the following symptoms: back pain, weakness. Pertinent negatives and positives per HPI. All others negative.   Social History   Socioeconomic History  . Marital status: Married    Spouse name: Not on file  . Number of children: 1  . Years of education: Not on file  . Highest education level: Not on file  Occupational History  . Occupation: retired  Tobacco Use  . Smoking status: Never Smoker  . Smokeless tobacco: Never Used  Vaping Use  . Vaping Use: Never used  Substance and Sexual Activity  . Alcohol use: No  . Drug use: No  . Sexual activity: Not on file  Other Topics Concern  . Not on file  Social History Narrative   Lives with husband at home   Right handed   Caffeine: 2 cups/day   Social Determinants of Health   Financial Resource Strain:   . Difficulty of Paying Living Expenses: Not on file  Food Insecurity:   . Worried About  Running Out of Food in the Last Year: Not on file  . Ran Out of Food in the Last Year: Not on file  Transportation Needs:   . Lack of Transportation (Medical): Not on file  . Lack of Transportation (Non-Medical): Not on file  Physical Activity:   . Days of Exercise per Week: Not on file  . Minutes of Exercise per Session: Not on file  Stress:   . Feeling of Stress : Not on file  Social Connections:   . Frequency of Communication with Friends and Family: Not on file  . Frequency of Social Gatherings with Friends  and Family: Not on file  . Attends Religious Services: Not on file  . Active Member of Clubs or Organizations: Not on file  . Attends Banker Meetings: Not on file  . Marital Status: Not on file  Intimate Partner Violence:   . Fear of Current or Ex-Partner: Not on file  . Emotionally Abused: Not on file  . Physically Abused: Not on file  . Sexually Abused: Not on file    Family History  Problem Relation Age of Onset  . Heart disease Father   . Breast cancer Maternal Aunt   . Diabetes Brother   . Leukemia Sister   . Colon cancer Neg Hx   . Esophageal cancer Neg Hx   . Rectal cancer Neg Hx   . Stomach cancer Neg Hx     Past Medical History:  Diagnosis Date  . Arthritis   . Colon polyp    adenomarous  . Diabetes mellitus without complication (HCC)   . Diverticulosis   . Hepatomegaly   . Hyperlipidemia   . Hypertension   . Left ventricular hypertrophy   . Macular degeneration of right eye   . Mitral valve prolapse   . Perforated ulcer (HCC) 2011  . Pneumonia   . 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 Spinal stenosis, lumbar region with neurogenic claudication 08/21/2019    Past Surgical History:  Procedure Laterality Date  . ABDOMINAL HYSTERECTOMY    . APPENDECTOMY    . BLADDER SUSPENSION    . BREAST BIOPSY Left   . BREAST EXCISIONAL BIOPSY Left   . CHOLECYSTECTOMY    . DILATION AND CURETTAGE OF UTERUS    . HEAD & NECK SKIN LESION EXCISIONAL BIOPSY     top left scalp  . LUMBAR LAMINECTOMY/DECOMPRESSION MICRODISCECTOMY N/A 08/21/2019   Procedure: Lumbar decompression L3-4 right, microdiscectomy L3-4 right;  Surgeon: Ranee Gosselin, MD;  Location: [facility];  Service: Orthopedics;  Laterality: N/A;   . SHOULDER ARTHROSCOPY WITH SUBACROMIAL DECOMPRESSION Right 01/30/2013   Procedure: SHOULDER ARTHROSCOPY  WITH SUBACROMIAL DECOMPRESSION;  Surgeon: Drucilla Schmidt, MD;  Location: [facility];  Service: Orthopedics;  Laterality: Right;  with labral debridement    Current Outpatient Medications  Medication Sig Dispense Refill  . amLODipine (NORVASC) 5 MG tablet Take 5 mg by mouth daily.    Marland Kitchen aspirin 81 MG EC tablet Take 81 mg by mouth daily.     Marland Kitchen CINNAMON PO Take 1,000 mg by mouth daily.    Marland Kitchen gabapentin (NEURONTIN) 300 MG capsule Take 600 mg by mouth at bedtime.    Marland Kitchen HYDROcodone-acetaminophen (NORCO) 7.5-325 MG tablet Take 1 tablet by mouth every 6 (six) hours as needed for severe pain ((  score 7 to 10)). 42 tablet 0  . metFORMIN (GLUCOPHAGE) 1000 MG tablet Take 1,000 mg by mouth 2 (two) times daily.     . methocarbamol (ROBAXIN) 500 MG tablet Take 1 tablet (500 mg total) by mouth every 6 (six) hours as needed for muscle spasms. 40 tablet 0  . metoprolol succinate (TOPROL-XL) 50 MG 24 hr tablet Take 50 mg by mouth daily. Take with or immediately following a meal.    . Multiple Vitamins-Minerals (ICAPS AREDS 2) CAPS Take 1 capsule by mouth 2 (two) times daily.     . valsartan-hydrochlorothiazide (DIOVAN-HCT) 160-25 MG tablet Take 1 tablet by mouth daily.     No current facility-administered medications for this visit.    Allergies as of 09/09/2020 - Review Complete 09/09/2020  Allergen Reaction Noted  . Ace inhibitors Cough 01/02/2015    Vitals: BP (!) 144/74 (BP Location: Right Arm, Patient Position: Sitting)   Pulse 67   Ht 5\' 6"  (1.676 m)   Wt 147 lb (66.7 kg)   SpO2 99%   BMI 23.73 kg/m  Last Weight:  Wt Readings from Last 1 Encounters:  09/09/20 147 lb (66.7 kg)   Last Height:   Ht Readings from Last 1 Encounters:  09/09/20 5\' 6"  (1.676 m)     Physical exam: Exam: Gen: NAD, conversant, well nourised, well groomed                     CV: RRR, no MRG. No Carotid Bruits. No peripheral edema, warm, nontender Eyes: Conjunctivae clear without exudates or  hemorrhage  Neuro: Detailed Neurologic Exam  Speech:    Speech is normal; fluent and spontaneous with normal comprehension.  Cognition:    The patient is oriented to person, place, and time;     recent and remote memory intact;     language fluent;     normal attention, concentration,     fund of knowledge Cranial Nerves:    The pupils are equal, round, and reactive to light.pupils too small to evaluate fundi, attempted.  Visual fields are full to threat. Extraocular movements are intact. Trigeminal sensation is intact and the muscles of mastication are normal. The face is symmetric. The palate elevates in the midline. Hearing intact. Voice is normal. Shoulder shrug is normal. The tongue has normal motion without fasciculations.   Coordination:    No dysmetria or ataxia  Gait:    Normal gait, no shuffling or ataxia  Motor Observation:    No asymmetry, no atrophy, and no involuntary movements noted. Tone:    Normal muscle tone.    Posture:    Posture is normal. normal erect    Strength: 4/5 right hip flexion otherwise strength is V/V in the upper and lower limbs.      Sensation: intact to LT     Reflex Exam:  DTR's: absent right patellar      Toes:    The toes are downgoing bilaterally.   Clonus:    Clonus is absent.    Assessment/Plan: 79 y.o. female here as requested by , MD for lumbar pain. PMHx lumbar decompression L3/L4 right microdiscectomy. She has new pain in an L3/L4 distribution on the right with absent patellar reflex. Appears to be impinged nerve root, will order MRI lumbar spine. She wishes to be sent to another practice not back to [facility], if MRI confirms we will send to [facility]. Patient tried injections, they are temporary, she would like surgical intervention  if appropriate to have this fixed.   Orders Placed This Encounter  Procedures  . MR LUMBAR SPINE WO CONTRAST   No orders of the defined types were placed in this  encounter.   Cc: Ranee Gosselin, MD,  Geoffry Paradise, MD   Naomie Dean, MD  [facility] [address] [place], [place] [address]  Phone [phone] Fax [phone]

## 2020-09-10 ENCOUNTER — Telehealth: Payer: Self-pay | Admitting: Neurology

## 2020-09-10 NOTE — Telephone Encounter (Signed)
[employer] medicare order sent to [facility]. They will obtain the auth and reach out to the patient to schedule.  °

## 2020-09-15 ENCOUNTER — Other Ambulatory Visit: Payer: Self-pay

## 2020-09-15 ENCOUNTER — Ambulatory Visit
Admission: RE | Admit: 2020-09-15 | Discharge: 2020-09-15 | Disposition: A | Discharge status: Home / Self Care | Payer: Medicare HMO | Source: Ambulatory Visit | Attending: Neurology | Admitting: Neurology

## 2020-09-15 DIAGNOSIS — M5417 Radiculopathy, lumbosacral region: Secondary | ICD-10-CM | POA: Diagnosis not present

## 2020-09-15 DIAGNOSIS — R202 Paresthesia of skin: Secondary | ICD-10-CM

## 2020-09-15 DIAGNOSIS — M79604 Pain in right leg: Secondary | ICD-10-CM

## 2020-09-15 DIAGNOSIS — R29898 Other symptoms and signs involving the musculoskeletal system: Secondary | ICD-10-CM

## 2020-09-15 DIAGNOSIS — R200 Anesthesia of skin: Secondary | ICD-10-CM

## 2020-09-17 ENCOUNTER — Telehealth: Payer: Self-pay | Admitting: *Deleted

## 2020-09-17 ENCOUNTER — Other Ambulatory Visit: Payer: Self-pay | Admitting: Neurology

## 2020-09-17 DIAGNOSIS — M5417 Radiculopathy, lumbosacral region: Secondary | ICD-10-CM

## 2020-09-17 NOTE — Progress Notes (Signed)
[place]

## 2020-09-17 NOTE — Telephone Encounter (Signed)
Spoke with patient and advised her MRI shows likely pinched nerve causing symptoms. I  will need to send back to [facility] for eval unless she wants a new group. Then I would send her to [facility].  She stated she would like to go back to [facility] but see a different provider. I advised will let Dr Lucia Gaskins know. Patient verbalized understanding, appreciation.

## 2020-09-23 DIAGNOSIS — M48062 Spinal stenosis, lumbar region with neurogenic claudication: Secondary | ICD-10-CM | POA: Diagnosis not present

## 2020-09-23 DIAGNOSIS — M7061 Trochanteric bursitis, right hip: Secondary | ICD-10-CM | POA: Diagnosis not present

## 2020-10-28 DIAGNOSIS — E785 Hyperlipidemia, unspecified: Secondary | ICD-10-CM | POA: Diagnosis not present

## 2020-10-28 DIAGNOSIS — I700 Atherosclerosis of aorta: Secondary | ICD-10-CM | POA: Diagnosis not present

## 2020-10-28 DIAGNOSIS — M5416 Radiculopathy, lumbar region: Secondary | ICD-10-CM | POA: Diagnosis not present

## 2020-10-28 DIAGNOSIS — R69 Illness, unspecified: Secondary | ICD-10-CM | POA: Diagnosis not present

## 2020-10-28 DIAGNOSIS — I129 Hypertensive chronic kidney disease with stage 1 through stage 4 chronic kidney disease, or unspecified chronic kidney disease: Secondary | ICD-10-CM | POA: Diagnosis not present

## 2020-10-28 DIAGNOSIS — N182 Chronic kidney disease, stage 2 (mild): Secondary | ICD-10-CM | POA: Diagnosis not present

## 2020-10-28 DIAGNOSIS — E1129 Type 2 diabetes mellitus with other diabetic kidney complication: Secondary | ICD-10-CM | POA: Diagnosis not present

## 2020-11-18 ENCOUNTER — Encounter (INDEPENDENT_AMBULATORY_CARE_PROVIDER_SITE_OTHER): Payer: Medicare HMO | Admitting: Ophthalmology

## 2020-11-19 NOTE — Progress Notes (Signed)
[facility] - Clinic Note  11/24/2020     CHIEF COMPLAINT Patient presents for Retina Follow Up   HISTORY OF PRESENT ILLNESS: Monica Hamilton is a 79 y.o. female who presents to the clinic today for:   HPI    Retina Follow Up    Patient presents with  Dry AMD.  In both eyes.  This started months ago.  Severity is moderate.  Duration of months.  Since onset it is stable.  I, the attending physician,  performed the HPI with the patient and updated documentation appropriately.          Comments    Pt states vision is about the same OU.  Pt denies eye pain or discomfort.  Pt denies any new or worsening floaters or fol OU.       Last edited by Rennis Chris, MD on 11/24/2020 10:20 AM. (History)    pt states vision seems stable  Referring physician: Self-referral -- wife of Tessia Kassin  HISTORICAL INFORMATION:   Selected notes from the MEDICAL RECORD NUMBER Self referral LEE:  Ocular Hx: pseudophakia OU [name] 2019) PMH: DM (on metformin), HTN, arthritis,    CURRENT MEDICATIONS: No current outpatient medications on file. (Ophthalmic Drugs)   No current facility-administered medications for this visit. (Ophthalmic Drugs)   Current Outpatient Medications (Other)  Medication Sig  . amLODipine (NORVASC) 5 MG tablet Take 5 mg by mouth daily.  Marland Kitchen aspirin 81 MG EC tablet Take 81 mg by mouth daily.   Marland Kitchen CINNAMON PO Take 1,000 mg by mouth daily.  Marland Kitchen gabapentin (NEURONTIN) 300 MG capsule Take 600 mg by mouth at bedtime.  Marland Kitchen HYDROcodone-acetaminophen (NORCO) 7.5-325 MG tablet Take 1 tablet by mouth every 6 (six) hours as needed for severe pain ((score 7 to 10)).  Marland Kitchen metFORMIN (GLUCOPHAGE) 1000 MG tablet Take 1,000 mg by mouth 2 (two) times daily.   . methocarbamol (ROBAXIN) 500 MG tablet Take 1 tablet (500 mg total) by mouth every 6 (six) hours as needed for muscle spasms.  . metoprolol succinate (TOPROL-XL) 50 MG 24 hr tablet Take 50 mg by mouth daily. Take with or  immediately following a meal.  . Multiple Vitamins-Minerals (ICAPS AREDS 2) CAPS Take 1 capsule by mouth 2 (two) times daily.   . valsartan-hydrochlorothiazide (DIOVAN-HCT) 160-25 MG tablet Take 1 tablet by mouth daily.   No current facility-administered medications for this visit. (Other)      REVIEW OF SYSTEMS: ROS    Positive for: Endocrine, Eyes   Negative for: Constitutional, Gastrointestinal, Neurological, Skin, Genitourinary, Musculoskeletal, HENT, Cardiovascular, Respiratory, Psychiatric, Allergic/Imm, Heme/Lymph   Last edited by Corrinne Eagle on 11/24/2020  9:22 AM. (History)       ALLERGIES Allergies  Allergen Reactions  . Ace Inhibitors Cough    PAST MEDICAL HISTORY Past Medical History:  Diagnosis Date  . Arthritis   . Colon polyp    adenomarous  . Diabetes mellitus without complication (HCC)   . Diverticulosis   . Hepatomegaly   . Hyperlipidemia   . Hypertension   . Left ventricular hypertrophy   . Macular degeneration of right eye   . Mitral valve prolapse   . Perforated ulcer (HCC) 2011  . Pneumonia   . 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 ABDOMINAL HYSTERECTOMY    . APPENDECTOMY    . BLADDER SUSPENSION    .  BREAST BIOPSY Left   . BREAST EXCISIONAL BIOPSY Left   . CHOLECYSTECTOMY    . DILATION AND CURETTAGE OF UTERUS    . HEAD & NECK SKIN LESION EXCISIONAL BIOPSY     top left scalp  . LUMBAR LAMINECTOMY/DECOMPRESSION MICRODISCECTOMY N/A 08/21/2019   Procedure: Lumbar decompression L3-4 right, microdiscectomy L3-4 right;  Surgeon: Ranee Gosselin, MD;  Location: [facility];  Service: Orthopedics;  Laterality: N/A;   . SHOULDER ARTHROSCOPY WITH SUBACROMIAL DECOMPRESSION Right 01/30/2013   Procedure: SHOULDER ARTHROSCOPY WITH SUBACROMIAL DECOMPRESSION;  Surgeon: Drucilla Schmidt, MD;  Location: [facility];  Service: Orthopedics;  Laterality: Right;  with  labral debridement    FAMILY HISTORY Family History  Problem Relation Age of Onset  . Heart disease Father   . Breast cancer Maternal Aunt   . Diabetes Brother   . Leukemia Sister   . Colon cancer Neg Hx   . Esophageal cancer Neg Hx   . Rectal cancer Neg Hx   . Stomach cancer Neg Hx     SOCIAL HISTORY Social History   Tobacco Use  . Smoking status: Never Smoker  . Smokeless tobacco: Never Used  Vaping Use  . Vaping Use: Never used  Substance Use Topics  . Alcohol use: No  . Drug use: No         OPHTHALMIC EXAM:  Base Eye Exam    Visual Acuity (Snellen - Linear)      Right Left   Dist [place] 20/40 -1 20/250 -1   Dist ph [place] 20/30 -1 NI       Tonometry (Tonopen, 9:24 AM)      Right Left   Pressure 22 18       Pupils      Dark Light Shape React APD   Right 3 2 Round Brisk 0   Left 3 2 Round Brisk 0       Visual Fields      Left Right    Full Full       Extraocular Movement      Right Left    Full Full       Neuro/Psych    Oriented x3: Yes   Mood/Affect: Normal       Dilation    Both eyes: 1.0% Mydriacyl, 2.5% Phenylephrine @ 9:24 AM        Slit Lamp and Fundus Exam    Slit Lamp Exam      Right Left   Lids/Lashes Dermatochalasis - upper lid, mild Meibomian gland dysfunction Dermatochalasis - upper lid, Meibomian gland dysfunction   Conjunctiva/Sclera Nasal Pinguecula Nasal Pinguecula   Cornea 1+Punctate epithelial erosions, Arcus, Temporal Well healed cataract wounds, trace Debris in tear film Trace Punctate epithelial erosions, Arcus, Temporal Well healed cataract wounds, trace Debris in tear film   Anterior Chamber Deep and quiet Deep and quiet   Iris Round and moderately dilated Round and dilated   Lens PC IOL in good position, trace PCO PC IOL in good position, 1-2+Posterior capsular opacification   Vitreous Vitreous syneresis Vitreous syneresis, Posterior vitreous detachment       Fundus Exam      Right Left   Disc Pink and Sharp Pink  and Sharp, mild temporal Peripapillary atrophy, mild PPP   C/D Ratio 0.4 0.4   Macula Flat, Blunted foveal reflex, RPE mottling and clumping, mild atrophy, Drusen, rare MA/IRH, trace cystic changes - slightly improved, No edema Flat, Blunted foveal reflex, RPE mottling, clumping and atrophy, Drusen,  mild peristent cystic changes, No heme   Vessels attenuated, mild tortuousity attenuated, mild tortuousity   Periphery Attached, scattered mild Reticular degeneration Attached, scattered Reticular degeneration          IMAGING AND PROCEDURES  Imaging and Procedures for @TODAY @  OCT, Retina - OU - Both Eyes       Right Eye Quality was good. Central Foveal Thickness: 301. Progression has improved. Findings include normal foveal contour, no SRF, retinal drusen , outer retinal atrophy, intraretinal fluid (stable drusen; interval improvement in central cystic changes).   Left Eye Quality was good. Central Foveal Thickness: 303. Progression has been stable. Findings include abnormal foveal contour, no SRF, retinal drusen , pigment epithelial detachment, intraretinal fluid, outer retinal atrophy (persistent central IRF/cystic changes).   Notes *Images captured and stored on drive  Diagnosis / Impression: Intermediate Non-Exudative ARMD OU Mild DME OU OD: stable drusen; interval improvement in central cystic changes OS: persistent central IRF/cystic changes  Clinical management:  See below  Abbreviations: NFP - Normal foveal profile. CME - cystoid macular edema. PED - pigment epithelial detachment. IRF - intraretinal fluid. SRF - subretinal fluid. EZ - ellipsoid zone. ERM - epiretinal membrane. ORA - outer retinal atrophy. ORT - outer retinal tubulation. SRHM - subretinal hyper-reflective material         Fluorescein Angiography Optos (Transit OD)       Right Eye   Progression has no prior data. Early phase findings include microaneurysm, staining. Mid/Late phase findings include  microaneurysm, leakage, staining (Focal perifoveal MA's with late leakage -- mild ).   Left Eye   Progression has no prior data. Early phase findings include staining, microaneurysm. Mid/Late phase findings include staining, microaneurysm (Focal perifoveal MA's with late leakage -- mild ).   Notes **Images stored on drive**  Impression: Mild NPDR -- Focal perifoveal MA's with late leakage -- mild OU Nonexudative ARMD OU -- no CNVM OU                 ASSESSMENT/PLAN:    ICD-10-CM   1. Intermediate stage nonexudative age-related macular degeneration of both eyes  H35.3132   2. Both eyes affected by mild nonproliferative diabetic retinopathy with macular edema, associated with type 2 diabetes mellitus (HCC)    3. Amblyopia of left eye  H53.002   4. Posterior vitreous detachment of left eye  H43.812   5. Retinal edema  H35.81 OCT, Retina - OU - Both Eyes  6. Pseudophakia of both eyes  Z96.1   7. Dry eyes  H04.123   8. Hypertensive retinopathy of both eyes  H35.033 Fluorescein Angiography Optos (Transit OD)    1. Intermediate Age related macular degeneration, non-exudative, both eyes -- stable  - The incidence, anatomy, and pathology of dry AMD, risk of progression, and the AREDS and AREDS 2 study including smoking risks discussed with patient.  - exam/OCT today shows OD: stable drusen; interval improvement in central cystic changes; OS: persistent central IRF/cystic changes  - suspect cystic changes mostly related to DM and HTN  - BCVA OD 20/30  - FA 01.04.21 - no CNVM OU -- no exudative disease  - continue amsler grid monitoring   - f/u 3 months, DFE, OCT  2. Mild non-proliferative diabetic retinopathy, both eyes - exam shows rare MA/IRH - OCT shows mild diabetic macular edema/cystic changes, both eyes -- OD improved, OS persistent  - FA 01.04.21 - late leaking, perifoveal MA OU; no NV OU - f/u in 3 mos --  DFE/OCT, possible injection  3. Severe amblyopia  OS  - history of patching as a child -- no strabismus surgery  - VA stable OS -- 20/200  4. PVD / vitreous syneresis OS  - Discussed findings and prognosis  - No RT or RD on 360 scleral depressed exam  - Reviewed s/s of RT/RD  - Strict return precautions for any such RT/RD signs/symptoms  5. No retinal edema on exam or OCT  6. Pseudophakia OU  - s/p CE/IOL OU by Nile RiggsShapiro  - beautiful surgeries, doing well  - monitor  7. Dry eyes OU - improving  - improved PEE on slit lamp exam  - likely main cause of chief complaint of burning upon waking  - cont artificial tears QID and lubricating ointment qhs as needed  Ophthalmic Meds Ordered this visit:  No orders of the defined types were placed in this encounter.     Return in about 3 months (around 02/22/2021) for f/u non-exu ARMD OU, DFE, OCT.  There are no Patient Instructions on file for this visit.  Explained the diagnoses, plan, and follow up with the patient and they expressed understanding.  Patient expressed understanding of the importance of proper follow up care.   This document serves as a record of services personally performed by Karie ChimeraBrian G. Ceferino Lang, MD, PhD. It was created on their behalf by Glee ArvinAmanda J. Manson PasseyBrown, OA an ophthalmic technician. The creation of this record is the provider's dictation and/or activities during the visit.    Electronically signed by: Glee ArvinAmanda J. Manson PasseyBrown, [place] 01.04.2022 1:28 PM  Karie ChimeraBrian G. Shlonda Dolloff, M.D., Ph.D. Diseases & Surgery of the Retina and Vitreous [facility] 11/24/2020   I have reviewed the above documentation for accuracy and completeness, and I agree with the above. Karie ChimeraBrian G. Resa Rinks, M.D., Ph.D. 11/24/20 1:28 PM   Abbreviations: M myopia (nearsighted); A astigmatism; H hyperopia (farsighted); P presbyopia; Mrx spectacle prescription;  CTL contact lenses; OD right eye; OS left eye; OU both eyes  XT exotropia; ET esotropia; PEK punctate epithelial keratitis; PEE punctate  epithelial erosions; DES dry eye syndrome; MGD meibomian gland dysfunction; ATs artificial tears; PFAT's preservative free artificial tears; NSC nuclear sclerotic cataract; PSC posterior subcapsular cataract; ERM epi-retinal membrane; PVD posterior vitreous detachment; RD retinal detachment; DM diabetes mellitus; DR diabetic retinopathy; NPDR non-proliferative diabetic retinopathy; PDR proliferative diabetic retinopathy; CSME clinically significant macular edema; DME diabetic macular edema; dbh dot blot hemorrhages; CWS cotton wool spot; POAG primary open angle glaucoma; C/D cup-to-disc ratio; HVF humphrey visual field; GVF goldmann visual field; OCT optical coherence tomography; IOP intraocular pressure; BRVO Branch retinal vein occlusion; CRVO central retinal vein occlusion; CRAO central retinal artery occlusion; BRAO branch retinal artery occlusion; RT retinal tear; SB scleral buckle; PPV pars plana vitrectomy; VH Vitreous hemorrhage; PRP panretinal laser photocoagulation; IVK intravitreal kenalog; VMT vitreomacular traction; MH Macular hole;  NVD neovascularization of the disc; NVE neovascularization elsewhere; AREDS age related eye disease study; ARMD age related macular degeneration; POAG primary open angle glaucoma; EBMD epithelial/anterior basement membrane dystrophy; ACIOL anterior chamber intraocular lens; IOL intraocular lens; PCIOL posterior chamber intraocular lens; Phaco/IOL phacoemulsification with intraocular lens placement; PRK photorefractive keratectomy; LASIK laser assisted in situ keratomileusis; HTN hypertension; DM diabetes mellitus; COPD chronic obstructive pulmonary disease

## 2020-11-24 ENCOUNTER — Other Ambulatory Visit: Payer: Self-pay

## 2020-11-24 ENCOUNTER — Encounter (INDEPENDENT_AMBULATORY_CARE_PROVIDER_SITE_OTHER): Payer: Self-pay | Admitting: Ophthalmology

## 2020-11-24 ENCOUNTER — Ambulatory Visit (INDEPENDENT_AMBULATORY_CARE_PROVIDER_SITE_OTHER): Payer: Medicare HMO | Admitting: Ophthalmology

## 2020-11-24 DIAGNOSIS — H353132 Nonexudative age-related macular degeneration, bilateral, intermediate dry stage: Secondary | ICD-10-CM | POA: Diagnosis not present

## 2020-11-24 DIAGNOSIS — H3581 Retinal edema: Secondary | ICD-10-CM | POA: Diagnosis not present

## 2020-11-24 DIAGNOSIS — E113213 Type 2 diabetes mellitus with mild nonproliferative diabetic retinopathy with macular edema, bilateral: Secondary | ICD-10-CM | POA: Diagnosis not present

## 2020-11-24 DIAGNOSIS — H53002 Unspecified amblyopia, left eye: Secondary | ICD-10-CM | POA: Diagnosis not present

## 2020-11-24 DIAGNOSIS — H35033 Hypertensive retinopathy, bilateral: Secondary | ICD-10-CM

## 2020-11-24 DIAGNOSIS — Z961 Presence of intraocular lens: Secondary | ICD-10-CM

## 2020-11-24 DIAGNOSIS — H04123 Dry eye syndrome of bilateral lacrimal glands: Secondary | ICD-10-CM | POA: Diagnosis not present

## 2020-11-24 DIAGNOSIS — H43812 Vitreous degeneration, left eye: Secondary | ICD-10-CM | POA: Diagnosis not present

## 2021-01-25 DIAGNOSIS — E1129 Type 2 diabetes mellitus with other diabetic kidney complication: Secondary | ICD-10-CM | POA: Diagnosis not present

## 2021-01-25 DIAGNOSIS — E785 Hyperlipidemia, unspecified: Secondary | ICD-10-CM | POA: Diagnosis not present

## 2021-02-01 DIAGNOSIS — H43812 Vitreous degeneration, left eye: Secondary | ICD-10-CM | POA: Diagnosis not present

## 2021-02-01 DIAGNOSIS — N182 Chronic kidney disease, stage 2 (mild): Secondary | ICD-10-CM | POA: Diagnosis not present

## 2021-02-01 DIAGNOSIS — Z13828 Encounter for screening for other musculoskeletal disorder: Secondary | ICD-10-CM | POA: Diagnosis not present

## 2021-02-01 DIAGNOSIS — M5416 Radiculopathy, lumbar region: Secondary | ICD-10-CM | POA: Diagnosis not present

## 2021-02-01 DIAGNOSIS — E1139 Type 2 diabetes mellitus with other diabetic ophthalmic complication: Secondary | ICD-10-CM | POA: Diagnosis not present

## 2021-02-01 DIAGNOSIS — E1129 Type 2 diabetes mellitus with other diabetic kidney complication: Secondary | ICD-10-CM | POA: Diagnosis not present

## 2021-02-01 DIAGNOSIS — Z0000 Encounter for general adult medical examination without abnormal findings: Secondary | ICD-10-CM | POA: Diagnosis not present

## 2021-02-01 DIAGNOSIS — I129 Hypertensive chronic kidney disease with stage 1 through stage 4 chronic kidney disease, or unspecified chronic kidney disease: Secondary | ICD-10-CM | POA: Diagnosis not present

## 2021-02-01 DIAGNOSIS — H3581 Retinal edema: Secondary | ICD-10-CM | POA: Diagnosis not present

## 2021-02-01 DIAGNOSIS — Z1339 Encounter for screening examination for other mental health and behavioral disorders: Secondary | ICD-10-CM | POA: Diagnosis not present

## 2021-02-01 DIAGNOSIS — Z1331 Encounter for screening for depression: Secondary | ICD-10-CM | POA: Diagnosis not present

## 2021-02-01 DIAGNOSIS — E785 Hyperlipidemia, unspecified: Secondary | ICD-10-CM | POA: Diagnosis not present

## 2021-02-01 DIAGNOSIS — I700 Atherosclerosis of aorta: Secondary | ICD-10-CM | POA: Diagnosis not present

## 2021-02-02 DIAGNOSIS — I10 Essential (primary) hypertension: Secondary | ICD-10-CM | POA: Diagnosis not present

## 2021-02-02 DIAGNOSIS — R82998 Other abnormal findings in urine: Secondary | ICD-10-CM | POA: Diagnosis not present

## 2021-02-02 DIAGNOSIS — Z1212 Encounter for screening for malignant neoplasm of rectum: Secondary | ICD-10-CM | POA: Diagnosis not present

## 2021-05-17 ENCOUNTER — Other Ambulatory Visit: Payer: Self-pay | Admitting: Internal Medicine

## 2021-05-17 DIAGNOSIS — Z1231 Encounter for screening mammogram for malignant neoplasm of breast: Secondary | ICD-10-CM

## 2021-06-04 DIAGNOSIS — M545 Low back pain, unspecified: Secondary | ICD-10-CM | POA: Diagnosis not present

## 2021-06-07 DIAGNOSIS — H353132 Nonexudative age-related macular degeneration, bilateral, intermediate dry stage: Secondary | ICD-10-CM | POA: Diagnosis not present

## 2021-06-07 DIAGNOSIS — N182 Chronic kidney disease, stage 2 (mild): Secondary | ICD-10-CM | POA: Diagnosis not present

## 2021-06-07 DIAGNOSIS — I129 Hypertensive chronic kidney disease with stage 1 through stage 4 chronic kidney disease, or unspecified chronic kidney disease: Secondary | ICD-10-CM | POA: Diagnosis not present

## 2021-06-07 DIAGNOSIS — I700 Atherosclerosis of aorta: Secondary | ICD-10-CM | POA: Diagnosis not present

## 2021-06-07 DIAGNOSIS — E1129 Type 2 diabetes mellitus with other diabetic kidney complication: Secondary | ICD-10-CM | POA: Diagnosis not present

## 2021-06-07 DIAGNOSIS — H3581 Retinal edema: Secondary | ICD-10-CM | POA: Diagnosis not present

## 2021-06-15 ENCOUNTER — Other Ambulatory Visit: Payer: Self-pay

## 2021-06-15 ENCOUNTER — Ambulatory Visit (INDEPENDENT_AMBULATORY_CARE_PROVIDER_SITE_OTHER): Payer: Medicare HMO | Admitting: Ophthalmology

## 2021-06-15 ENCOUNTER — Encounter (INDEPENDENT_AMBULATORY_CARE_PROVIDER_SITE_OTHER): Payer: Self-pay | Admitting: Ophthalmology

## 2021-06-15 DIAGNOSIS — E113213 Type 2 diabetes mellitus with mild nonproliferative diabetic retinopathy with macular edema, bilateral: Secondary | ICD-10-CM | POA: Diagnosis not present

## 2021-06-15 DIAGNOSIS — Z961 Presence of intraocular lens: Secondary | ICD-10-CM | POA: Diagnosis not present

## 2021-06-15 DIAGNOSIS — H353112 Nonexudative age-related macular degeneration, right eye, intermediate dry stage: Secondary | ICD-10-CM | POA: Diagnosis not present

## 2021-06-15 DIAGNOSIS — H43812 Vitreous degeneration, left eye: Secondary | ICD-10-CM

## 2021-06-15 DIAGNOSIS — H3581 Retinal edema: Secondary | ICD-10-CM

## 2021-06-15 DIAGNOSIS — H353221 Exudative age-related macular degeneration, left eye, with active choroidal neovascularization: Secondary | ICD-10-CM

## 2021-06-15 DIAGNOSIS — H04123 Dry eye syndrome of bilateral lacrimal glands: Secondary | ICD-10-CM | POA: Diagnosis not present

## 2021-06-15 DIAGNOSIS — H53002 Unspecified amblyopia, left eye: Secondary | ICD-10-CM | POA: Diagnosis not present

## 2021-06-15 MED ORDER — BEVACIZUMAB CHEMO INJECTION 1.25MG/0.05ML SYRINGE FOR KALEIDOSCOPE
1.2500 mg | INTRAVITREAL | Status: AC | PRN
  Administered 2021-06-15: 1.25 mg via INTRAVITREAL

## 2021-06-15 NOTE — Progress Notes (Signed)
[facility] - Clinic Note  06/15/2021     CHIEF COMPLAINT Patient presents for Retina Follow Up   HISTORY OF PRESENT ILLNESS: Monica Hamilton is a 80 y.o. female who presents to the clinic today for:   HPI     Retina Follow Up   Patient presents with  Dry AMD.  In both eyes.  This started 5 months ago.  I, the attending physician,  performed the HPI with the patient and updated documentation appropriately.        Comments   Patient here for 5 months retina follow up for non exu ARMD OU. Patient states vision doing ok. No eye pain. Readers not working.       Last edited by Rennis Chris, MD on 06/15/2021  2:56 PM.    Bluford Main she may need stronger glasses.  Referring physician: Self-referral -- wife of Monica Hamilton  HISTORICAL INFORMATION:   Selected notes from the MEDICAL RECORD NUMBER Self referral Ocular Hx: pseudophakia OU [name] 2019) PMH: DM (on metformin), HTN, arthritis,    CURRENT MEDICATIONS: No current outpatient medications on file. (Ophthalmic Drugs)   No current facility-administered medications for this visit. (Ophthalmic Drugs)   Current Outpatient Medications (Other)  Medication Sig   amLODipine (NORVASC) 5 MG tablet Take 5 mg by mouth daily.   aspirin 81 MG EC tablet Take 81 mg by mouth daily.    CINNAMON PO Take 1,000 mg by mouth daily.   gabapentin (NEURONTIN) 300 MG capsule Take 600 mg by mouth at bedtime.   HYDROcodone-acetaminophen (NORCO) 7.5-325 MG tablet Take 1 tablet by mouth every 6 (six) hours as needed for severe pain ((score 7 to 10)).   metFORMIN (GLUCOPHAGE) 1000 MG tablet Take 1,000 mg by mouth 2 (two) times daily.    methocarbamol (ROBAXIN) 500 MG tablet Take 1 tablet (500 mg total) by mouth every 6 (six) hours as needed for muscle spasms.   metoprolol succinate (TOPROL-XL) 50 MG 24 hr tablet Take 50 mg by mouth daily. Take with or immediately following a meal.   Multiple Vitamins-Minerals (ICAPS AREDS 2) CAPS  Take 1 capsule by mouth 2 (two) times daily.    valsartan-hydrochlorothiazide (DIOVAN-HCT) 160-25 MG tablet Take 1 tablet by mouth daily.   No current facility-administered medications for this visit. (Other)      REVIEW OF SYSTEMS: ROS   Positive for: Endocrine, Eyes Negative for: Constitutional, Gastrointestinal, Neurological, Skin, Genitourinary, Musculoskeletal, HENT, Cardiovascular, Respiratory, Psychiatric, Allergic/Imm, Heme/Lymph Last edited by Laddie Aquas, COA on 06/15/2021  1:13 PM.        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   Past Surgical History:  Procedure Laterality Date   ABDOMINAL HYSTERECTOMY     APPENDECTOMY     BLADDER SUSPENSION     BREAST BIOPSY Left    BREAST EXCISIONAL BIOPSY Left    CHOLECYSTECTOMY     DILATION AND CURETTAGE  OF UTERUS     HEAD & NECK SKIN LESION EXCISIONAL BIOPSY     top left scalp   LUMBAR LAMINECTOMY/DECOMPRESSION MICRODISCECTOMY N/A 08/21/2019   Procedure: Lumbar decompression L3-4 right, microdiscectomy L3-4 right;  Surgeon: Ranee GosselinGioffre, Ronald, MD;  Location: [facility];  Service: Orthopedics;  Laterality: N/A;  90min   SHOULDER ARTHROSCOPY WITH SUBACROMIAL DECOMPRESSION Right 01/30/2013   Procedure: SHOULDER ARTHROSCOPY WITH SUBACROMIAL DECOMPRESSION;  Surgeon: Drucilla SchmidtJames P Aplington, MD;  Location: [facility];  Service: Orthopedics;  Laterality: Right;  with labral debridement    FAMILY HISTORY Family History  Problem Relation Age of Onset   Heart disease Father     Breast cancer Maternal Aunt    Diabetes Brother    Leukemia Sister    Colon cancer Neg Hx    Esophageal cancer Neg Hx    Rectal cancer Neg Hx    Stomach cancer Neg Hx     SOCIAL HISTORY Social History   Tobacco Use   Smoking status: Never   Smokeless tobacco: Never  Vaping Use   Vaping Use: Never used  Substance Use Topics   Alcohol use: No   Drug use: No         OPHTHALMIC EXAM:  Base Eye Exam     Visual Acuity (Snellen - Linear)       Right Left   Dist [place] 20/40 +2 20/350 +1   Dist ph  20/30 -2 NI         Tonometry (Tonopen, 1:10 PM)       Right Left   Pressure 18 19         Pupils       Dark Light Shape React APD   Right 3 2 Round Brisk None   Left 3 2 Round Brisk None         Visual Fields (Counting fingers)       Left Right    Full Full         Extraocular Movement       Right Left    Full Full         Neuro/Psych     Oriented x3: Yes   Mood/Affect: Normal         Dilation     Both eyes: 1.0% Mydriacyl, 2.5% Phenylephrine @ 1:09 PM           Slit Lamp and Fundus Exam     Slit Lamp Exam       Right Left   Lids/Lashes Dermatochalasis - upper lid, mild Meibomian gland dysfunction Dermatochalasis - upper lid, Meibomian gland dysfunction   Conjunctiva/Sclera Nasal Pinguecula Nasal Pinguecula   Cornea 1+Punctate epithelial erosions, Arcus, Temporal Well healed cataract wounds, trace Debris in tear film 1+ Punctate epithelial erosions, Arcus, Temporal Well healed cataract wounds, trace Debris in tear film   Anterior Chamber Deep and quiet Deep and quiet   Iris Round and moderately dilated Round and dilated   Lens PC IOL in good position, 1+ PCO PC IOL in good position, 1-2+Posterior capsular opacification   Vitreous Vitreous syneresis Vitreous syneresis, Posterior vitreous detachment         Fundus Exam       Right Left   Disc Pink and Sharp Pink and Sharp, mild temporal Peripapillary atrophy, mild PPP   C/D  Ratio 0.4 0.4   Macula Flat, Blunted foveal reflex, RPE mottling and clumping, mild atrophy, Drusen, rare MA/IRH, trace cystic changes - slightly improved, No edema Blunted foveal reflex,  central edema w/+heme, +CNV w/heme, +drusen, RPE mottling   Vessels attenuated, mild tortuousity attenuated, mild tortuousity   Periphery Attached, scattered mild Reticular degeneration.  No heme. Attached, scattered Reticular degeneration.  No heme.            IMAGING AND PROCEDURES  Imaging and Procedures for @TODAY @  OCT, Retina - OU - Both Eyes       Right Eye Quality was good. Central Foveal Thickness: 293. Progression has improved. Findings include normal foveal contour, no SRF, retinal drusen , outer retinal atrophy, no IRF (stable drusen; interval improvement in central cystic changes).   Left Eye Quality was good. Central Foveal Thickness: 493. Progression has worsened. Findings include abnormal foveal contour, retinal drusen , pigment epithelial detachment, intraretinal fluid, outer retinal atrophy, subretinal fluid, intraretinal hyper-reflective material, subretinal hyper-reflective material (Interval development of SRF/SRHM/IRHM--conversion to exudative disease).   Notes *Images captured and stored on drive  Diagnosis / Impression: Mild DME OU OD: nonexudative ARMD - stable drusen; interval improvement in central cystic changes OS: Exudative ARMD - Interval development of SRF/SRHM/IRHM--conversion to exudative disease  Clinical management:  See below  Abbreviations: NFP - Normal foveal profile. CME - cystoid macular edema. PED - pigment epithelial detachment. IRF - intraretinal fluid. SRF - subretinal fluid. EZ - ellipsoid zone. ERM - epiretinal membrane. ORA - outer retinal atrophy. ORT - outer retinal tubulation. SRHM - subretinal hyper-reflective material       Intravitreal Injection, Pharmacologic Agent - OS - Left Eye       Time Out 06/15/2021. 1:50 PM. Confirmed correct  patient, procedure, site, and patient consented.   Anesthesia Topical anesthesia was used. Anesthetic medications included Proparacaine 0.5%, Lidocaine 2%.   Procedure Preparation included 5% betadine to ocular surface, eyelid speculum. A (32g) needle was used.   Injection: 1.25 mg Bevacizumab 1.25mg /0.68ml   Route: Intravitreal, Site: Left Eye   NDC: 0m, [id number], Expiration date: 07/19/2021, Waste: 0.05 mL   Post-op Post injection exam found visual acuity of at least counting fingers. The patient tolerated the procedure well. There were no complications. The patient received written and verbal post procedure care education. Post injection medications were not given.               ASSESSMENT/PLAN:    ICD-10-CM   1. Exudative age-related macular degeneration of left eye with active choroidal neovascularization (HCC)  H35.3221 Intravitreal Injection, Pharmacologic Agent - OS - Left Eye    Bevacizumab (AVASTIN) SOLN 1.25 mg    2. Retinal edema  H35.81 OCT, Retina - OU - Both Eyes    3. Intermediate stage nonexudative age-related macular degeneration of right eye  H35.3112     4. Both eyes affected by mild nonproliferative diabetic retinopathy with macular edema, associated with type 2 diabetes mellitus (HCC)  03-14-2002     5. Amblyopia of left eye  H53.002     6. Posterior vitreous detachment of left eye  H43.812     7. Pseudophakia of both eyes  Z96.1     8. Dry eyes  H04.123      1,2. Exudative age related macular degeneration, OS   - interval conversion to exudative ARMD noted on 7.26.22  - exam with +CNV w/ new heme and edema OS  - The incidence pathology and anatomy of wet AMD discussed   - The ANCHOR, MARINA, CATT and VIEW trials discussed with patient.    - discussed treatment options including observation vs intravitreal anti-VEGF agents such as Avastin,  Lucentis, Eylea.    - Risks of endophthalmitis and vascular occlusive events and atrophic  changes discussed with patient  - OCT shows interval development of SRF/SRHM/IRHM--conversion to exudative disease  - recommend IVA OS #1 today, 7.26.22  - pt wishes to be treated with IVA - RBA of procedure discussed, questions answered - informed consent obtained and signed - see procedure note   - f/u in 4 wks -- DFE/OCT/likely inj.  3. Intermediate Age related macular degeneration, non-exudative, OD -- stable  - The incidence, anatomy, and pathology of dry AMD, risk of progression, and the AREDS and AREDS 2 study including smoking risks discussed with patient.  - exam/OCT today shows OD: stable drusen; interval improvement in central cystic changes; OS: persistent central IRF/cystic changes  - suspect cystic changes mostly related to DM and HTN  - BCVA OD 20/30  - FA 01.04.21 - no CNVM OU -- no exudative disease  - continue amsler grid monitoring   4. Mild non-proliferative diabetic retinopathy, both eyes - exam shows rare MA/IRH - OCT shows mild diabetic macular edema/cystic changes, both eyes -- OD improved, OS interval development of SRF/SRHM/IRHM--conversion to exudative disease  - FA 01.04.21 - late leaking, perifoveal MA OU; no NV OU - monitor  5. Severe amblyopia OS  - history of patching as a child -- no strabismus surgery  - VA down to 20/350 from 20/200 (baseline) due to conv to exu ARMD  6. PVD / vitreous syneresis OS  - Discussed findings and prognosis  - No RT or RD on 360 scleral depressed exam  - Reviewed s/s of RT/RD  - Strict return precautions for any such RT/RD signs/symptoms  7. Pseudophakia OU  - s/p CE/IOL OU by [name]  - beautiful surgeries, doing well  - monitor  8. Dry eyes OU - improving  - improved PEE on slit lamp exam  - likely main cause of chief complaint of burning upon waking  - cont artificial tears QID and lubricating ointment qhs as needed  Ophthalmic Meds Ordered this visit:  Meds ordered this encounter  Medications   Bevacizumab  (AVASTIN) SOLN 1.25 mg       Return in about 4 weeks (around 07/13/2021) for 4 wk f/u for exu ARMD OS w/DFE/OCT/likely inj..  There are no Patient Instructions on file for this visit.  Explained the diagnoses, plan, and follow up with the patient and they expressed understanding.  Patient expressed understanding of the importance of proper follow up care.   This document serves as a record of services personally performed by Karie Chimera, MD, PhD. It was created on their behalf by Cristopher Estimable, COT an ophthalmic technician. The creation of this record is the provider's dictation and/or activities during the visit.    Electronically signed by: Cristopher Estimable, COT 7.26.22 @ 3:07 PM   Karie Chimera, M.D., Ph.D. Diseases & Surgery of the Retina and Vitreous [facility] 7.26.22  I have reviewed the above documentation for accuracy and completeness, and I agree with the above. Karie Chimera, M.D., Ph.D. 06/15/21 3:12 PM  Abbreviations: M myopia (nearsighted); A astigmatism; H hyperopia (farsighted); P presbyopia; Mrx spectacle prescription;  CTL contact lenses; OD right eye; OS left eye; OU both eyes  XT exotropia; ET esotropia; PEK punctate epithelial keratitis; PEE punctate epithelial erosions; DES dry eye syndrome; MGD meibomian gland dysfunction; ATs artificial tears; PFAT's preservative free artificial tears; NSC nuclear sclerotic cataract; PSC posterior subcapsular cataract; ERM epi-retinal  membrane; PVD posterior vitreous detachment; RD retinal detachment; DM diabetes mellitus; DR diabetic retinopathy; NPDR non-proliferative diabetic retinopathy; PDR proliferative diabetic retinopathy; CSME clinically significant macular edema; DME diabetic macular edema; dbh dot blot hemorrhages; CWS cotton wool spot; POAG primary open angle glaucoma; C/D cup-to-disc ratio; HVF humphrey visual field; GVF goldmann visual field; OCT optical coherence tomography; IOP intraocular  pressure; BRVO Branch retinal vein occlusion; CRVO central retinal vein occlusion; CRAO central retinal artery occlusion; BRAO branch retinal artery occlusion; RT retinal tear; SB scleral buckle; PPV pars plana vitrectomy; VH Vitreous hemorrhage; PRP panretinal laser photocoagulation; IVK intravitreal kenalog; VMT vitreomacular traction; MH Macular hole;  NVD neovascularization of the disc; NVE neovascularization elsewhere; AREDS age related eye disease study; ARMD age related macular degeneration; POAG primary open angle glaucoma; EBMD epithelial/anterior basement membrane dystrophy; ACIOL anterior chamber intraocular lens; IOL intraocular lens; PCIOL posterior chamber intraocular lens; Phaco/IOL phacoemulsification with intraocular lens placement; PRK photorefractive keratectomy; LASIK laser assisted in situ keratomileusis; HTN hypertension; DM diabetes mellitus; COPD chronic obstructive pulmonary disease

## 2021-06-24 DIAGNOSIS — M545 Low back pain, unspecified: Secondary | ICD-10-CM | POA: Diagnosis not present

## 2021-07-01 DIAGNOSIS — M47816 Spondylosis without myelopathy or radiculopathy, lumbar region: Secondary | ICD-10-CM | POA: Diagnosis not present

## 2021-07-01 DIAGNOSIS — M47896 Other spondylosis, lumbar region: Secondary | ICD-10-CM | POA: Diagnosis not present

## 2021-07-01 DIAGNOSIS — M545 Low back pain, unspecified: Secondary | ICD-10-CM | POA: Diagnosis not present

## 2021-07-08 ENCOUNTER — Other Ambulatory Visit: Payer: Self-pay

## 2021-07-08 ENCOUNTER — Ambulatory Visit
Admission: RE | Admit: 2021-07-08 | Discharge: 2021-07-08 | Disposition: A | Discharge status: Home / Self Care | Payer: Medicare HMO | Source: Ambulatory Visit | Attending: Internal Medicine | Admitting: Internal Medicine

## 2021-07-08 DIAGNOSIS — M545 Low back pain, unspecified: Secondary | ICD-10-CM | POA: Diagnosis not present

## 2021-07-08 DIAGNOSIS — Z1231 Encounter for screening mammogram for malignant neoplasm of breast: Secondary | ICD-10-CM | POA: Diagnosis not present

## 2021-07-12 NOTE — Progress Notes (Signed)
[facility] - Clinic Note  07/13/2021     CHIEF COMPLAINT Patient presents for Retina Follow Up   HISTORY OF PRESENT ILLNESS: Monica Hamilton is a 80 y.o. female who presents to the clinic today for:   HPI     Retina Follow Up   Patient presents with  Wet AMD.  In left eye.  This started weeks ago.  Severity is moderate.  Duration of weeks.  Since onset it is stable.  I, the attending physician,  performed the HPI with the patient and updated documentation appropriately.        Comments   Pt states vision is the same OU.  Pt denies eye pain or discomfort.  Denies new or worsening floaters or fol OU.      Last edited by Rennis Chris, MD on 07/13/2021 11:23 PM.     Bluford Main she may need stronger glasses.  Referring physician: Self-referral -- wife of Betania Dizon  HISTORICAL INFORMATION:   Selected notes from the MEDICAL RECORD NUMBER Self referral Ocular Hx: pseudophakia OU [name] 2019) PMH: DM (on metformin), HTN, arthritis,    CURRENT MEDICATIONS: No current outpatient medications on file. (Ophthalmic Drugs)   No current facility-administered medications for this visit. (Ophthalmic Drugs)   Current Outpatient Medications (Other)  Medication Sig   amLODipine (NORVASC) 5 MG tablet Take 5 mg by mouth daily.   aspirin 81 MG EC tablet Take 81 mg by mouth daily.    CINNAMON PO Take 1,000 mg by mouth daily.   gabapentin (NEURONTIN) 300 MG capsule Take 600 mg by mouth at bedtime.   HYDROcodone-acetaminophen (NORCO) 7.5-325 MG tablet Take 1 tablet by mouth every 6 (six) hours as needed for severe pain ((score 7 to 10)).   metFORMIN (GLUCOPHAGE) 1000 MG tablet Take 1,000 mg by mouth 2 (two) times daily.    methocarbamol (ROBAXIN) 500 MG tablet Take 1 tablet (500 mg total) by mouth every 6 (six) hours as needed for muscle spasms.   metoprolol succinate (TOPROL-XL) 50 MG 24 hr tablet Take 50 mg by mouth daily. Take with or immediately following a meal.    Multiple Vitamins-Minerals (ICAPS AREDS 2) CAPS Take 1 capsule by mouth 2 (two) times daily.    valsartan-hydrochlorothiazide (DIOVAN-HCT) 160-25 MG tablet Take 1 tablet by mouth daily.   No current facility-administered medications for this visit. (Other)   REVIEW OF SYSTEMS: ROS   Positive for: Endocrine, Eyes Negative for: Constitutional, Gastrointestinal, Neurological, Skin, Genitourinary, Musculoskeletal, HENT, Cardiovascular, Respiratory, Psychiatric, Allergic/Imm, Heme/Lymph Last edited by Corrinne Eagle on 07/13/2021  2:33 PM.     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48, led to Mitral valve prolapse   Past Surgical History:  Procedure Laterality Date   ABDOMINAL HYSTERECTOMY     APPENDECTOMY     BLADDER SUSPENSION     BREAST BIOPSY Left    BREAST EXCISIONAL BIOPSY Left    CHOLECYSTECTOMY  DILATION AND CURETTAGE OF UTERUS     HEAD & NECK SKIN LESION EXCISIONAL BIOPSY     top left scalp   LUMBAR LAMINECTOMY/DECOMPRESSION MICRODISCECTOMY N/A 08/21/2019   Procedure: Lumbar decompression L3-4 right, microdiscectomy L3-4 right;  Surgeon: Ranee Gosselin, MD;  Location: [facility];  Service: Orthopedics;  Laterality: N/A;    SHOULDER ARTHROSCOPY WITH SUBACROMIAL DECOMPRESSION Right 01/30/2013   Procedure: SHOULDER ARTHROSCOPY WITH SUBACROMIAL DECOMPRESSION;  Surgeon: Drucilla Schmidt, MD;  Location: [facility];  Service: Orthopedics;  Laterality: Right;  with labral debridement    FAMILY HISTORY Family History  Problem Relation Age of  Onset   Heart disease Father    Breast cancer Maternal Aunt    Diabetes Brother    Leukemia Sister    Colon cancer Neg Hx    Esophageal cancer Neg Hx    Rectal cancer Neg Hx    Stomach cancer Neg Hx     SOCIAL HISTORY Social History   Tobacco Use   Smoking status: Never   Smokeless tobacco: Never  Vaping Use   Vaping Use: Never used  Substance Use Topics   Alcohol use: No   Drug use: No         OPHTHALMIC EXAM:  Base Eye Exam     Visual Acuity (Snellen - Linear)       Right Left   Dist cc 20/40 +1 20/150 -   Dist ph cc 20/25 -2 NI  Pt omits first part of row OS during vision check        Tonometry (Tonopen, 2:37 PM)       Right Left   Pressure 18 18         Pupils       Dark Light Shape React APD   Right 3 2 Round Brisk 0   Left 3 2 Round Brisk 0         Visual Fields       Left Right    Full Full         Extraocular Movement       Right Left    Full Full         Neuro/Psych     Oriented x3: Yes   Mood/Affect: Normal         Dilation     Both eyes: 1.0% Mydriacyl, 2.5% Phenylephrine @ 2:38 PM           Slit Lamp and Fundus Exam     Slit Lamp Exam       Right Left   Lids/Lashes Dermatochalasis - upper lid, mild Meibomian gland dysfunction Dermatochalasis - upper lid, Meibomian gland dysfunction   Conjunctiva/Sclera Nasal Pinguecula Nasal Pinguecula   Cornea 1+Punctate epithelial erosions, Arcus, Temporal Well healed cataract wounds, trace Debris in tear film 1+ Punctate epithelial erosions, Arcus, Temporal Well healed cataract wounds, trace Debris in tear film   Anterior Chamber Deep and quiet Deep and quiet   Iris Round and moderately dilated Round and dilated   Lens PC IOL in good position, 1+ PCO PC IOL in good position, 1-2+Posterior capsular opacification   Vitreous Vitreous syneresis Vitreous syneresis, Posterior vitreous detachment         Fundus Exam       Right Left   Disc Pink and Sharp Pink and  Sharp, mild temporal Peripapillary atrophy, mild PPP   C/D Ratio 0.4 0.4   Macula Flat, Blunted foveal reflex, RPE mottling and clumping, mild atrophy, Drusen, rare MA/IRH,  No edema Blunted foveal reflex, central edema w/+heme - improved, +CNV w/heme, +drusen, RPE mottling   Vessels attenuated, mild tortuousity attenuated, mild tortuousity   Periphery Attached, scattered mild Reticular degeneration.  No heme. Attached, scattered Reticular degeneration.  No heme.            IMAGING AND PROCEDURES  Imaging and Procedures for @  OCT, Retina - OU - Both Eyes       Right Eye Quality was good. Central Foveal Thickness: 287. Progression has been stable. Findings include normal foveal contour, no SRF, retinal drusen , outer retinal atrophy, no IRF (stable drusen).   Left Eye Quality was good. Central Foveal Thickness: 553. Progression has improved. Findings include abnormal foveal contour, retinal drusen , pigment epithelial detachment, intraretinal fluid, outer retinal atrophy, subretinal fluid, intraretinal hyper-reflective material, subretinal hyper-reflective material (Interval increase in central IRF and edema, interval improvement in SRF).   Notes *Images captured and stored on drive  Diagnosis / Impression: Mild DME OU OD: nonexudative ARMD - stable drusen OS: Exudative ARMD - Interval increase in central IRF and edema, interval improvement in SRF  Clinical management:  See below  Abbreviations: NFP - Normal foveal profile. CME - cystoid macular edema. PED - pigment epithelial detachment. IRF - intraretinal fluid. SRF - subretinal fluid. EZ - ellipsoid zone. ERM - epiretinal membrane. ORA - outer retinal atrophy. ORT - outer retinal tubulation. SRHM - subretinal hyper-reflective material       Intravitreal Injection, Pharmacologic Agent - OS - Left Eye       Time Out 07/13/2021. 3:26 PM. Confirmed correct patient, procedure, site, and patient consented.    Anesthesia Topical anesthesia was used. Anesthetic medications included Proparacaine 0.5%, Lidocaine 2%.   Procedure Preparation included 5% betadine to ocular surface, eyelid speculum. A supplied (32g) needle was used.   Injection: 1.25 mg Bevacizumab 1.25mg /0.14ml   Route: Intravitreal, Site: Left Eye   NDC: [id number], Lot: [id number] , Expiration date: 08/04/2021, Waste: 0 mL   Post-op Post injection exam found visual acuity of at least counting fingers. The patient tolerated the procedure well. There were no complications. The patient received written and verbal post procedure care education. Post injection medications were not given.            ASSESSMENT/PLAN:    ICD-10-CM   1. Exudative age-related macular degeneration of left eye with active choroidal neovascularization (HCC)  H35.3221 Intravitreal Injection, Pharmacologic Agent - OS - Left Eye    Bevacizumab (AVASTIN) SOLN 1.25 mg    2. Retinal edema  H35.81 OCT, Retina - OU - Both Eyes    3. Intermediate stage nonexudative age-related macular degeneration of right eye  H35.3112     4. Both eyes affected by mild nonproliferative diabetic retinopathy with macular edema, associated with type 2 diabetes mellitus (HCC)  D22.0254     5. Amblyopia of left eye  H53.002     6. Posterior vitreous detachment of left eye  H43.812     7. Pseudophakia of both eyes  Z96.1     8. Dry eyes  H04.123      1,2. Exudative age related macular degeneration, OS   - interval conversion to exudative ARMD noted on 7.26.22             - s/p IVA OS #1 (07.26.22)  - BCVA improved to 20/150 from 20/350  - OCT shows interval increase in central IRF and edema, interval improvement in SRF  - recommend IVA OS #2 today, 08.23.22  -  pt wishes to be treated with IVA - RBA of procedure discussed, questions answered - informed consent obtained and signed - see procedure note   - f/u in 4 wks -- DFE/OCT/likely inj.  3. Intermediate Age  related macular degeneration, non-exudative, OD -- stable  - exam/OCT today shows OD: stable drusen  - suspect cystic changes mostly related to DM and HTN  - BCVA OD 20/25  - FA 01.04.21 - no CNVM OU -- no exudative disease  - continue amsler grid monitoring   4. Mild non-proliferative diabetic retinopathy, both eyes - exam shows rare MA/IRH - OCT shows mild diabetic macular edema/cystic changes, both eyes -- OD improved, OS interval development of SRF/SRHM/IRHM--conversion to exudative disease  - FA 01.04.21 - late leaking, perifoveal MA OU; no NV OU - monitor  5. Severe amblyopia OS  - history of patching as a child -- no strabismus surgery  - VA dropped down to 20/350 from 20/200 (baseline) due to conv to exu ARMD on 7.26.22  6. PVD / vitreous syneresis OS  - Discussed findings and prognosis  - No RT or RD on 360 scleral depressed exam  - Reviewed s/s of RT/RD  - Strict return precautions for any such RT/RD signs/symptoms  7. Pseudophakia OU  - s/p CE/IOL OU by Nile RiggsShapiro  - beautiful surgeries, doing well  - monitor  8. Dry eyes OU - improving  - improved PEE on slit lamp exam  - likely main cause of chief complaint of burning upon waking  - cont artificial tears QID and lubricating ointment qhs as needed  Ophthalmic Meds Ordered this visit:  Meds ordered this encounter  Medications   Bevacizumab (AVASTIN) SOLN 1.25 mg        Return in about 4 weeks (around 08/10/2021) for f/u exu ARMD OS, DFE, OCT.  There are no Patient Instructions on file for this visit.  This document serves as a record of services personally performed by Karie ChimeraBrian G. Peyten Weare, MD, PhD. It was created on their behalf by Herby AbrahamAshley English, COA, an ophthalmic technician. The creation of this record is the provider's dictation and/or activities during the visit.    Electronically signed by: Herby AbrahamAshley English, COA @TODAY @ 11:30 PM  This document serves as a record of services personally performed by Karie ChimeraBrian G.  Kierah Goatley, MD, PhD. It was created on their behalf by Glee ArvinAmanda J. Manson PasseyBrown, OA an ophthalmic technician. The creation of this record is the provider's dictation and/or activities during the visit.    Electronically signed by: Glee ArvinAmanda J. Kristopher OppenheimBrown, OA 08.23.2022 11:30 PM  Karie ChimeraBrian G. Tharon Kitch, M.D., Ph.D. Diseases & Surgery of the Retina and Vitreous [facility] 07/13/2021   I have reviewed the above documentation for accuracy and completeness, and I agree with the above. Karie ChimeraBrian G. Jeson Camacho, M.D., Ph.D. 07/13/21 11:30 PM  Abbreviations: M myopia (nearsighted); A astigmatism; H hyperopia (farsighted); P presbyopia; Mrx spectacle prescription;  CTL contact lenses; OD right eye; OS left eye; OU both eyes  XT exotropia; ET esotropia; PEK punctate epithelial keratitis; PEE punctate epithelial erosions; DES dry eye syndrome; MGD meibomian gland dysfunction; ATs artificial tears; PFAT's preservative free artificial tears; NSC nuclear sclerotic cataract; PSC posterior subcapsular cataract; ERM epi-retinal membrane; PVD posterior vitreous detachment; RD retinal detachment; DM diabetes mellitus; DR diabetic retinopathy; NPDR non-proliferative diabetic retinopathy; PDR proliferative diabetic retinopathy; CSME clinically significant macular edema; DME diabetic macular edema; dbh dot blot hemorrhages; CWS cotton wool spot; POAG primary open angle glaucoma; C/D cup-to-disc ratio;  HVF humphrey visual field; GVF goldmann visual field; OCT optical coherence tomography; IOP intraocular pressure; BRVO Branch retinal vein occlusion; CRVO central retinal vein occlusion; CRAO central retinal artery occlusion; BRAO branch retinal artery occlusion; RT retinal tear; SB scleral buckle; PPV pars plana vitrectomy; VH Vitreous hemorrhage; PRP panretinal laser photocoagulation; IVK intravitreal kenalog; VMT vitreomacular traction; MH Macular hole;  NVD neovascularization of the disc; NVE neovascularization elsewhere; AREDS age  related eye disease study; ARMD age related macular degeneration; POAG primary open angle glaucoma; EBMD epithelial/anterior basement membrane dystrophy; ACIOL anterior chamber intraocular lens; IOL intraocular lens; PCIOL posterior chamber intraocular lens; Phaco/IOL phacoemulsification with intraocular lens placement; PRK photorefractive keratectomy; LASIK laser assisted in situ keratomileusis; HTN hypertension; DM diabetes mellitus; COPD chronic obstructive pulmonary disease

## 2021-07-13 ENCOUNTER — Encounter (INDEPENDENT_AMBULATORY_CARE_PROVIDER_SITE_OTHER): Payer: Self-pay | Admitting: Ophthalmology

## 2021-07-13 ENCOUNTER — Ambulatory Visit (INDEPENDENT_AMBULATORY_CARE_PROVIDER_SITE_OTHER): Payer: Medicare HMO | Admitting: Ophthalmology

## 2021-07-13 ENCOUNTER — Other Ambulatory Visit: Payer: Self-pay

## 2021-07-13 DIAGNOSIS — H43812 Vitreous degeneration, left eye: Secondary | ICD-10-CM | POA: Diagnosis not present

## 2021-07-13 DIAGNOSIS — H3581 Retinal edema: Secondary | ICD-10-CM | POA: Diagnosis not present

## 2021-07-13 DIAGNOSIS — E113213 Type 2 diabetes mellitus with mild nonproliferative diabetic retinopathy with macular edema, bilateral: Secondary | ICD-10-CM | POA: Diagnosis not present

## 2021-07-13 DIAGNOSIS — H04123 Dry eye syndrome of bilateral lacrimal glands: Secondary | ICD-10-CM

## 2021-07-13 DIAGNOSIS — H53002 Unspecified amblyopia, left eye: Secondary | ICD-10-CM

## 2021-07-13 DIAGNOSIS — H353221 Exudative age-related macular degeneration, left eye, with active choroidal neovascularization: Secondary | ICD-10-CM

## 2021-07-13 DIAGNOSIS — H353132 Nonexudative age-related macular degeneration, bilateral, intermediate dry stage: Secondary | ICD-10-CM

## 2021-07-13 DIAGNOSIS — Z961 Presence of intraocular lens: Secondary | ICD-10-CM

## 2021-07-13 DIAGNOSIS — H353112 Nonexudative age-related macular degeneration, right eye, intermediate dry stage: Secondary | ICD-10-CM

## 2021-07-13 MED ORDER — BEVACIZUMAB CHEMO INJECTION 1.25MG/0.05ML SYRINGE FOR KALEIDOSCOPE
1.2500 mg | INTRAVITREAL | Status: AC | PRN
  Administered 2021-07-13: 1.25 mg via INTRAVITREAL

## 2021-07-15 DIAGNOSIS — M545 Low back pain, unspecified: Secondary | ICD-10-CM | POA: Diagnosis not present

## 2021-07-30 DIAGNOSIS — M545 Low back pain, unspecified: Secondary | ICD-10-CM | POA: Diagnosis not present

## 2021-07-30 DIAGNOSIS — M47896 Other spondylosis, lumbar region: Secondary | ICD-10-CM | POA: Diagnosis not present

## 2021-08-10 ENCOUNTER — Encounter (INDEPENDENT_AMBULATORY_CARE_PROVIDER_SITE_OTHER): Payer: Medicare HMO | Admitting: Ophthalmology

## 2021-08-11 ENCOUNTER — Encounter (INDEPENDENT_AMBULATORY_CARE_PROVIDER_SITE_OTHER): Payer: Self-pay | Admitting: Ophthalmology

## 2021-08-11 ENCOUNTER — Encounter (INDEPENDENT_AMBULATORY_CARE_PROVIDER_SITE_OTHER): Payer: Medicare HMO | Admitting: Ophthalmology

## 2021-08-11 ENCOUNTER — Other Ambulatory Visit: Payer: Self-pay

## 2021-08-11 ENCOUNTER — Ambulatory Visit (INDEPENDENT_AMBULATORY_CARE_PROVIDER_SITE_OTHER): Payer: Medicare HMO | Admitting: Ophthalmology

## 2021-08-11 DIAGNOSIS — Z961 Presence of intraocular lens: Secondary | ICD-10-CM

## 2021-08-11 DIAGNOSIS — H3581 Retinal edema: Secondary | ICD-10-CM

## 2021-08-11 DIAGNOSIS — H43812 Vitreous degeneration, left eye: Secondary | ICD-10-CM

## 2021-08-11 DIAGNOSIS — H04123 Dry eye syndrome of bilateral lacrimal glands: Secondary | ICD-10-CM | POA: Diagnosis not present

## 2021-08-11 DIAGNOSIS — H353221 Exudative age-related macular degeneration, left eye, with active choroidal neovascularization: Secondary | ICD-10-CM

## 2021-08-11 DIAGNOSIS — E113213 Type 2 diabetes mellitus with mild nonproliferative diabetic retinopathy with macular edema, bilateral: Secondary | ICD-10-CM

## 2021-08-11 DIAGNOSIS — H53002 Unspecified amblyopia, left eye: Secondary | ICD-10-CM

## 2021-08-11 DIAGNOSIS — H353112 Nonexudative age-related macular degeneration, right eye, intermediate dry stage: Secondary | ICD-10-CM

## 2021-08-11 NOTE — Progress Notes (Signed)
[facility] - Clinic Note  08/11/2021     CHIEF COMPLAINT Patient presents for Retina Follow Up   HISTORY OF PRESENT ILLNESS: Monica Hamilton is a 80 y.o. female who presents to the clinic today for:   HPI     Retina Follow Up   Patient presents with  Wet AMD.  In left eye.  This started months ago.  Severity is moderate.  Duration of 4 weeks.  Since onset it is stable.  I, the attending physician,  performed the HPI with the patient and updated documentation appropriately.        Comments   80 y/o female pt here for 4 wk f/u for exu ARMD OS.  No change in [place] OU.  Denies pain, FOL, floaters.  No gtts.      Last edited by Rennis Chris, MD on 08/14/2021 10:30 PM.     Referring physician: Self-referral -- wife of Jesusita Oka  HISTORICAL INFORMATION:   Selected notes from the MEDICAL RECORD NUMBER Self referral Ocular Hx: pseudophakia OU [name] 2019) PMH: DM (on metformin), HTN, arthritis,    CURRENT MEDICATIONS: No current outpatient medications on file. (Ophthalmic Drugs)   No current facility-administered medications for this visit. (Ophthalmic Drugs)   Current Outpatient Medications (Other)  Medication Sig   amLODipine (NORVASC) 5 MG tablet Take 5 mg by mouth daily.   aspirin 81 MG EC tablet Take 81 mg by mouth daily.    CINNAMON PO Take 1,000 mg by mouth daily.   gabapentin (NEURONTIN) 100 MG capsule gabapentin 100 mg capsule  TAKE 1 CAPSULE BY MOUTH ONCE DAILY AT BEDTIME FOR 30 DAYS   gabapentin (NEURONTIN) 300 MG capsule Take 600 mg by mouth at bedtime.   HYDROcodone-acetaminophen (NORCO) 7.5-325 MG tablet Take 1 tablet by mouth every 6 (six) hours as needed for severe pain ((score 7 to 10)).   metFORMIN (GLUCOPHAGE) 1000 MG tablet Take 1,000 mg by mouth 2 (two) times daily.    methocarbamol (ROBAXIN) 500 MG tablet Take 1 tablet (500 mg total) by mouth every 6 (six) hours as needed for muscle spasms.   metoprolol succinate (TOPROL-XL) 50  MG 24 hr tablet Take 50 mg by mouth daily. Take with or immediately following a meal.   MODERNA COVID-19 VACCINE 100 MCG/0.5ML injection    Multiple Vitamins-Minerals (ICAPS AREDS 2) CAPS Take 1 capsule by mouth 2 (two) times daily.    rosuvastatin (CRESTOR) 20 MG tablet Take by mouth.   valsartan-hydrochlorothiazide (DIOVAN-HCT) 160-25 MG tablet Take 1 tablet by mouth daily.   No current facility-administered medications for this visit. (Other)   REVIEW OF SYSTEMS: ROS   Positive for: Musculoskeletal, Endocrine, Cardiovascular, Eyes Negative for: Constitutional, Gastrointestinal, Neurological, Skin, Genitourinary, HENT, Respiratory, Psychiatric, Allergic/Imm, Heme/Lymph Last edited by Celine Mans, COA on 08/11/2021  3:06 PM.      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   Past Surgical History:  Procedure Laterality Date   ABDOMINAL HYSTERECTOMY     APPENDECTOMY     BLADDER SUSPENSION     BREAST BIOPSY Left    BREAST EXCISIONAL BIOPSY Left    CATARACT EXTRACTION     CHOLECYSTECTOMY     DILATION AND CURETTAGE OF UTERUS     EYE SURGERY     HEAD & NECK SKIN LESION EXCISIONAL BIOPSY     top left scalp   LUMBAR LAMINECTOMY/DECOMPRESSION MICRODISCECTOMY N/A 08/21/2019   Procedure: Lumbar decompression L3-4 right, microdiscectomy L3-4 right;  Surgeon: Ranee Gosselin, MD;  Location: [facility];  Service: Orthopedics;  Laterality: N/A;    SHOULDER ARTHROSCOPY WITH  SUBACROMIAL DECOMPRESSION Right 01/30/2013   Procedure: SHOULDER ARTHROSCOPY WITH SUBACROMIAL DECOMPRESSION;  Surgeon: Drucilla Schmidt, MD;  Location: [facility];  Service: Orthopedics;  Laterality: Right;  with labral debridement    FAMILY HISTORY Family History  Problem Relation Age of Onset   Heart disease Father    Breast cancer Maternal Aunt    Diabetes Brother    Leukemia Sister    Colon cancer Neg Hx    Esophageal cancer Neg Hx    Rectal cancer Neg Hx    Stomach cancer Neg Hx     SOCIAL HISTORY Social History   Tobacco Use   Smoking status: Never   Smokeless tobacco: Never  Vaping Use   Vaping Use: Never used  Substance Use Topics   Alcohol use: No   Drug use: No         OPHTHALMIC EXAM:  Base Eye Exam     Visual Acuity (Snellen - Linear)       Right Left   Dist [place] 20/25 -2 20/200 -   Dist ph [place] NI NI         Tonometry (Tonopen, 3:09 PM)       Right Left   Pressure 18 17         Pupils       Dark Light Shape React APD   Right 3 2 Round Brisk None   Left 3 2 Round Brisk None         Visual Fields (Counting fingers)       Left Right    Full Full         Extraocular Movement       Right Left    Full, Ortho Full, Ortho         Neuro/Psych     Oriented x3: Yes   Mood/Affect: Normal         Dilation     Both eyes: 1.0% Mydriacyl, 2.5% Phenylephrine @ 3:09 PM           Slit Lamp and Fundus Exam     Slit Lamp Exam       Right Left   Lids/Lashes Dermatochalasis - upper lid, mild Meibomian gland dysfunction Dermatochalasis - upper lid, Meibomian gland dysfunction   Conjunctiva/Sclera Nasal Pinguecula Nasal Pinguecula   Cornea 1+Punctate epithelial erosions, Arcus, Temporal Well healed cataract wounds, trace Debris in tear film 1+ Punctate epithelial erosions, Arcus, Temporal Well healed cataract wounds, trace Debris in tear film   Anterior Chamber Deep and quiet Deep and quiet   Iris Round and moderately dilated Round  and dilated   Lens PC IOL in good position, 1+ PCO PC IOL in good position, 1-2+Posterior capsular opacification   Vitreous Vitreous syneresis, Posterior vitreous  detachment, vitreous condensations Vitreous syneresis, Posterior vitreous detachment         Fundus Exam       Right Left   Disc Pink and Sharp Pink and Sharp, mild temporal Peripapillary atrophy, mild PPP   C/D Ratio 0.4 0.4   Macula Flat, Blunted foveal reflex, RPE mottling and clumping, mild atrophy, Drusen, rare MA/IRH, No edema Blunted foveal reflex, central edema w/+heme - improved, +CNV w/heme - improved, +drusen, RPE mottling   Vessels attenuated, mild tortuousity attenuated, mild tortuousity   Periphery Attached, scattered mild Reticular degeneration.  No heme. Attached, scattered Reticular degeneration.  No heme.            IMAGING AND PROCEDURES  Imaging and Procedures for @TODAY @  OCT, Retina - OU - Both Eyes       Right Eye Quality was good. Central Foveal Thickness: 284. Progression has been stable. Findings include normal foveal contour, no SRF, retinal drusen , outer retinal atrophy, no IRF, myopic contour (Trace cystic changes IT fovea).   Left Eye Quality was good. Central Foveal Thickness: 468. Progression has improved. Findings include abnormal foveal contour, retinal drusen , pigment epithelial detachment, intraretinal fluid, outer retinal atrophy, subretinal fluid, intraretinal hyper-reflective material, subretinal hyper-reflective material (Interval improvement in [facility], PED and overlying IRF).   Notes *Images captured and stored on drive  Diagnosis / Impression: Mild DME OU OD: nonexudative ARMD - stable drusen OS: Exudative ARMD - Interval improvement in [facility], PED and overlying IRF  Clinical management:  See below  Abbreviations: NFP - Normal foveal profile. CME - cystoid macular edema. PED - pigment epithelial detachment. IRF - intraretinal fluid. SRF - subretinal fluid. EZ - ellipsoid  zone. ERM - epiretinal membrane. ORA - outer retinal atrophy. ORT - outer retinal tubulation. SRHM - subretinal hyper-reflective material       Intravitreal Injection, Pharmacologic Agent - OS - Left Eye       Time Out 08/11/2021. 4:01 PM. Confirmed correct patient, procedure, site, and patient consented.   Anesthesia Topical anesthesia was used. Anesthetic medications included Proparacaine 0.5%, Lidocaine 2%.   Procedure Preparation included 5% betadine to ocular surface, eyelid speculum. A supplied (32g) needle was used.   Injection: 1.25 mg Bevacizumab 1.25mg /0.5ml   Route: Intravitreal, Site: Left Eye   NDC: 0m, Lot: [id number], Expiration date: 09/12/2021, Waste: 0.05 mL   Post-op Post injection exam found visual acuity of at least counting fingers. The patient tolerated the procedure well. There were no complications. The patient received written and verbal post procedure care education. Post injection medications were not given.            ASSESSMENT/PLAN:    ICD-10-CM   1. Exudative age-related macular degeneration of left eye with active choroidal neovascularization (HCC)  H35.3221 Intravitreal Injection, Pharmacologic Agent - OS - Left Eye    Bevacizumab (AVASTIN) SOLN 1.25 mg    2. Retinal edema  H35.81 OCT, Retina - OU - Both Eyes    3. Intermediate stage nonexudative age-related macular degeneration of right eye  H35.3112     4. Both eyes affected by mild nonproliferative diabetic retinopathy with macular edema, associated with type 2 diabetes mellitus (HCC)  03-14-2002     5. Amblyopia of left eye  H53.002     6. Posterior vitreous detachment of left eye  H43.812     7. Pseudophakia of both eyes  Z96.1     8. Dry eyes  H04.123       1,2. Exudative  age related macular degeneration, OS   - interval conversion to exudative ARMD noted on 7.26.22             - s/p IVA OS #1 (07.26.22), #2 (08.23.22)  - BCVA 20/200 -- back to baseline  (amblyopia)  - OCT shows Interval improvement in [facility], PED and overlying IRF  - recommend IVA OS #3 today, 09.21.22  - pt wishes to be treated with IVA - RBA of procedure discussed, questions answered - informed consent obtained and signed - see procedure note   - f/u in 4 wks -- DFE/OCT/likely inj.  3. Intermediate Age related macular degeneration, non-exudative, OD -- stable  - exam/OCT today shows OD: stable drusen  - suspect cystic changes mostly related to DM and HTN  - BCVA OD 20/25  - FA 01.04.21 - no CNVM OU -- no exudative disease  - continue amsler grid monitoring   4. Mild non-proliferative diabetic retinopathy, both eyes - exam shows rare MA/IRH - OCT shows mild diabetic macular edema/cystic changes, both eyes -- OD improved, OS interval development of SRF/SRHM/IRHM--conversion to exudative disease  - FA 01.04.21 - late leaking, perifoveal MA OU; no NV OU - monitor  5. Severe amblyopia OS  - history of patching as a child -- no strabismus surgery  - VA dropped down to 20/350 from 20/200 (baseline) due to conv to exu ARMD on 7.26.22  - now back to baseline of 20/200  6. PVD / vitreous syneresis OS  - Discussed findings and prognosis  - No RT or RD on 360 scleral depressed exam  - Reviewed s/s of RT/RD  - Strict return precautions for any such RT/RD signs/symptoms  7. Pseudophakia OU  - s/p CE/IOL OU by [name]  - beautiful surgeries, doing well  - monitor  8. Dry eyes OU - improving  - improved PEE on slit lamp exam  - likely main cause of chief complaint of burning upon waking  - cont artificial tears QID and lubricating ointment qhs as needed  Ophthalmic Meds Ordered this visit:  Meds ordered this encounter  Medications   Bevacizumab (AVASTIN) SOLN 1.25 mg       Return in about 4 weeks (around 09/08/2021) for f/u exu ARMD OS, DFE, OCT.  There are no Patient Instructions on file for this visit.  This document serves as a record of services personally  performed by Karie Chimera, MD, PhD. It was created on their behalf by Cristopher Estimable, COT an ophthalmic technician. The creation of this record is the provider's dictation and/or activities during the visit.    Electronically signed by: Cristopher Estimable, COT 9.21.22 @ 11:23 PM   Karie Chimera, M.D., Ph.D. Diseases & Surgery of the Retina and Vitreous [facility] 9.21.22  I have reviewed the above documentation for accuracy and completeness, and I agree with the above. Karie Chimera, M.D., Ph.D. 08/14/21 11:25 PM   Abbreviations: M myopia (nearsighted); A astigmatism; H hyperopia (farsighted); P presbyopia; Mrx spectacle prescription;  CTL contact lenses; OD right eye; OS left eye; OU both eyes  XT exotropia; ET esotropia; PEK punctate epithelial keratitis; PEE punctate epithelial erosions; DES dry eye syndrome; MGD meibomian gland dysfunction; ATs artificial tears; PFAT's preservative free artificial tears; NSC nuclear sclerotic cataract; PSC posterior subcapsular cataract; ERM epi-retinal membrane; PVD posterior vitreous detachment; RD retinal detachment; DM diabetes mellitus; DR diabetic retinopathy; NPDR non-proliferative diabetic retinopathy; PDR proliferative diabetic retinopathy; CSME clinically significant macular edema; DME diabetic  macular edema; dbh dot blot hemorrhages; CWS cotton wool spot; POAG primary open angle glaucoma; C/D cup-to-disc ratio; HVF humphrey visual field; GVF goldmann visual field; OCT optical coherence tomography; IOP intraocular pressure; BRVO Branch retinal vein occlusion; CRVO central retinal vein occlusion; CRAO central retinal artery occlusion; BRAO branch retinal artery occlusion; RT retinal tear; SB scleral buckle; PPV pars plana vitrectomy; VH Vitreous hemorrhage; PRP panretinal laser photocoagulation; IVK intravitreal kenalog; VMT vitreomacular traction; MH Macular hole;  NVD neovascularization of the disc; NVE neovascularization  elsewhere; AREDS age related eye disease study; ARMD age related macular degeneration; POAG primary open angle glaucoma; EBMD epithelial/anterior basement membrane dystrophy; ACIOL anterior chamber intraocular lens; IOL intraocular lens; PCIOL posterior chamber intraocular lens; Phaco/IOL phacoemulsification with intraocular lens placement; [place] photorefractive keratectomy; LASIK laser assisted in situ keratomileusis; HTN hypertension; DM diabetes mellitus; COPD chronic obstructive pulmonary disease

## 2021-08-14 ENCOUNTER — Encounter (INDEPENDENT_AMBULATORY_CARE_PROVIDER_SITE_OTHER): Payer: Self-pay | Admitting: Ophthalmology

## 2021-08-14 MED ORDER — BEVACIZUMAB CHEMO INJECTION 1.25MG/0.05ML SYRINGE FOR KALEIDOSCOPE
1.2500 mg | INTRAVITREAL | Status: AC | PRN
  Administered 2021-08-11: 1.25 mg via INTRAVITREAL

## 2021-09-02 NOTE — Progress Notes (Signed)
[facility] - Clinic Note  09/08/2021     CHIEF COMPLAINT Patient presents for Retina Follow Up  HISTORY OF PRESENT ILLNESS: Monica Hamilton is a 80 y.o. female who presents to the clinic today for:   HPI     Retina Follow Up   Patient presents with  Wet AMD.  In left eye.  Severity is mild.  Duration of 4 weeks.  Since onset it is stable.  I, the attending physician,  performed the HPI with the patient and updated documentation appropriately.        Comments   Pt here for 4 wk f/u exu ARMD OS. Pt states vision is the same, doing well. No ocular pain or discomfort. Reports sugar levels have been normal.       Last edited by Rennis Chris, MD on 09/08/2021  1:13 PM.     Referring physician: Self-referral -- wife of Jesusita Oka  HISTORICAL INFORMATION:  Selected notes from the MEDICAL RECORD NUMBER Self referral Ocular Hx: pseudophakia OU [name] 2019) PMH: DM (on metformin), HTN, arthritis,    CURRENT MEDICATIONS: No current outpatient medications on file. (Ophthalmic Drugs)   No current facility-administered medications for this visit. (Ophthalmic Drugs)   Current Outpatient Medications (Other)  Medication Sig   amLODipine (NORVASC) 5 MG tablet Take 5 mg by mouth daily.   aspirin 81 MG EC tablet Take 81 mg by mouth daily.    CINNAMON PO Take 1,000 mg by mouth daily.   gabapentin (NEURONTIN) 100 MG capsule gabapentin 100 mg capsule  TAKE 1 CAPSULE BY MOUTH ONCE DAILY AT BEDTIME FOR 30 DAYS   gabapentin (NEURONTIN) 300 MG capsule Take 600 mg by mouth at bedtime.   HYDROcodone-acetaminophen (NORCO) 7.5-325 MG tablet Take 1 tablet by mouth every 6 (six) hours as needed for severe pain ((score 7 to 10)).   metFORMIN (GLUCOPHAGE) 1000 MG tablet Take 1,000 mg by mouth 2 (two) times daily.    methocarbamol (ROBAXIN) 500 MG tablet Take 1 tablet (500 mg total) by mouth every 6 (six) hours as needed for muscle spasms.   metoprolol succinate (TOPROL-XL)  50 MG 24 hr tablet Take 50 mg by mouth daily. Take with or immediately following a meal.   MODERNA COVID-19 VACCINE 100 MCG/0.5ML injection    Multiple Vitamins-Minerals (ICAPS AREDS 2) CAPS Take 1 capsule by mouth 2 (two) times daily.    rosuvastatin (CRESTOR) 20 MG tablet Take by mouth.   valsartan-hydrochlorothiazide (DIOVAN-HCT) 160-25 MG tablet Take 1 tablet by mouth daily.   No current facility-administered medications for this visit. (Other)   REVIEW OF SYSTEMS: ROS   Positive for: Musculoskeletal, Endocrine, Cardiovascular, Eyes Negative for: Constitutional, Gastrointestinal, Neurological, Skin, Genitourinary, HENT, Respiratory, Psychiatric, Allergic/Imm, Heme/Lymph Last edited by Thompson Grayer, COT on 09/08/2021 12:36 PM.     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56, led to Mitral valve  prolapse   Past Surgical History:  Procedure Laterality Date   ABDOMINAL HYSTERECTOMY     APPENDECTOMY     BLADDER SUSPENSION     BREAST BIOPSY Left    BREAST EXCISIONAL BIOPSY Left    CATARACT EXTRACTION     CHOLECYSTECTOMY     DILATION AND CURETTAGE OF UTERUS     EYE SURGERY     HEAD & NECK SKIN LESION EXCISIONAL BIOPSY     top left scalp   LUMBAR LAMINECTOMY/DECOMPRESSION MICRODISCECTOMY N/A 08/21/2019   Procedure: Lumbar decompression L3-4 right, microdiscectomy L3-4 right;  Surgeon: Ranee Gosselin, MD;  Location: [facility];  Service: Orthopedics;  Laterality: N/A;    SHOULDER ARTHROSCOPY WITH  SUBACROMIAL DECOMPRESSION Right 01/30/2013   Procedure: SHOULDER ARTHROSCOPY WITH SUBACROMIAL DECOMPRESSION;  Surgeon: Drucilla Schmidt, MD;  Location: [facility];  Service: Orthopedics;  Laterality: Right;  with labral debridement   FAMILY HISTORY Family History  Problem Relation Age of Onset   Heart disease Father    Breast cancer Maternal Aunt    Diabetes Brother    Leukemia Sister    Colon cancer Neg Hx    Esophageal cancer Neg Hx    Rectal cancer Neg Hx    Stomach cancer Neg Hx    SOCIAL HISTORY Social History   Tobacco Use   Smoking status: Never   Smokeless tobacco: Never  Vaping Use   Vaping Use: Never used  Substance Use Topics   Alcohol use: No   Drug use: No       OPHTHALMIC EXAM: Base Eye Exam     Visual Acuity (Snellen - Linear)       Right Left   Dist [place] 20/30 -2 20/400   Dist ph [place] 20/25 -2 NI         Tonometry (Tonopen, 12:45 PM)       Right Left   Pressure 9 10         Pupils       Dark Light Shape React APD   Right 3 2 Round Brisk None   Left 3 2 Round Brisk None         Visual Fields (Counting fingers)       Left Right    Full Full         Extraocular Movement       Right Left    Full, Ortho Full, Ortho         Neuro/Psych     Oriented x3: Yes   Mood/Affect: Normal         Dilation     Both eyes: 1.0% Mydriacyl, 2.5% Phenylephrine @ 12:46 PM           Slit Lamp and Fundus Exam     Slit Lamp Exam       Right Left   Lids/Lashes Dermatochalasis - upper lid, mild Meibomian gland dysfunction Dermatochalasis - upper lid, Meibomian gland dysfunction   Conjunctiva/Sclera Nasal Pinguecula Nasal Pinguecula   Cornea 1+Punctate epithelial erosions, Arcus, Temporal Well healed cataract wounds, trace Debris in tear film 1+ Punctate epithelial erosions, Arcus, Temporal Well healed cataract wounds, trace Debris in tear film   Anterior Chamber Deep and quiet Deep and quiet   Iris Round and moderately dilated Round and  dilated   Lens PC IOL in good position, 1+ PCO PC IOL in good position, 1-2+Posterior capsular opacification   Vitreous Vitreous syneresis, Posterior vitreous detachment, vitreous condensations Vitreous syneresis, Posterior vitreous detachment  Fundus Exam       Right Left   Disc Pink and Sharp Pink and Sharp, mild temporal Peripapillary atrophy, mild PPP   C/D Ratio 0.4 0.4   Macula Flat, Blunted foveal reflex, RPE mottling and clumping, mild atrophy, Drusen, rare MA/IRH, No edema Blunted foveal reflex, central edema w/+heme - improved, +CNV w/heme - improved, +drusen, RPE mottling   Vessels attenuated, mild tortuousity attenuated, mild tortuousity   Periphery Attached, scattered mild Reticular degeneration.  No heme. Attached, scattered Reticular degeneration.  No heme.           IMAGING AND PROCEDURES  Imaging and Procedures for @TODAY @  OCT, Retina - OU - Both Eyes       Right Eye Quality was good. Central Foveal Thickness: 292. Progression has been stable. Findings include normal foveal contour, no SRF, retinal drusen , outer retinal atrophy, no IRF, myopic contour (Trace cystic changes IT fovea improved).   Left Eye Quality was good. Central Foveal Thickness: 410. Progression has improved. Findings include abnormal foveal contour, retinal drusen , pigment epithelial detachment, intraretinal fluid, outer retinal atrophy, subretinal fluid, intraretinal hyper-reflective material, subretinal hyper-reflective material (Interval improvement in [facility], PED and overlying IRF).   Notes *Images captured and stored on drive  Diagnosis / Impression: Mild DME OU OD: nonexudative ARMD - stable drusen OS: Exudative ARMD - Interval improvement in [facility], PED and overlying IRF  Clinical management:  See below  Abbreviations: NFP - Normal foveal profile. CME - cystoid macular edema. PED - pigment epithelial detachment. IRF - intraretinal fluid. SRF - subretinal fluid. EZ -  ellipsoid zone. ERM - epiretinal membrane. ORA - outer retinal atrophy. ORT - outer retinal tubulation. SRHM - subretinal hyper-reflective material       Intravitreal Injection, Pharmacologic Agent - OS - Left Eye       Time Out 09/08/2021. 1:05 PM. Confirmed correct patient, procedure, site, and patient consented.   Anesthesia Topical anesthesia was used. Anesthetic medications included Lidocaine 2%, Proparacaine 0.5%.   Procedure Preparation included 5% betadine to ocular surface, eyelid speculum. A supplied needle was used.   Injection: 1.25 mg Bevacizumab 1.25mg /0.4ml   Route: Intravitreal, Site: Left Eye   NDC: 0m, Lot: [id number] , Expiration date: 10/27/2021, Waste: 0 mL   Post-op Post injection exam found visual acuity of at least counting fingers. The patient tolerated the procedure well. There were no complications. The patient received written and verbal post procedure care education.             ASSESSMENT/PLAN:   ICD-10-CM   1. Exudative age-related macular degeneration of left eye with active choroidal neovascularization (HCC)  H35.3221 Intravitreal Injection, Pharmacologic Agent - OS - Left Eye    Bevacizumab (AVASTIN) SOLN 1.25 mg    2. Retinal edema  H35.81 OCT, Retina - OU - Both Eyes    3. Intermediate stage nonexudative age-related macular degeneration of right eye  H35.3112     4. Both eyes affected by mild nonproliferative diabetic retinopathy with macular edema, associated with type 2 diabetes mellitus (HCC)  14/05/2021     5. Amblyopia of left eye  H53.002     6. Posterior vitreous detachment of left eye  H43.812     7. Pseudophakia of both eyes  Z96.1     8. Dry eyes  H04.123     1,2. Exudative age related macular degeneration, OS   - interval conversion to exudative ARMD noted on 7.26.22             -  s/p IVA OS #1 (07.26.22), #2 (08.23.22), #3 (09.21.22)  - BCVA 20/400 -- back to baseline (amblyopia)  - OCT shows Interval  improvement in [facility], PED and overlying IRF  - recommend IVA OS #4 today, 10.13.22  - pt wishes to be treated with IVA - RBA of procedure discussed, questions answered - informed consent obtained and signed - see procedure note   - f/u in 4 wks -- DFE/OCT/likely inj.  3. Intermediate Age related macular degeneration, non-exudative, OD -- stable  - exam/OCT today shows OD: stable drusen  - suspect cystic changes mostly related to DM and HTN  - BCVA OD 20/25  - FA 01.04.21 - no CNVM OU -- no exudative disease  - continue amsler grid monitoring  4. Mild non-proliferative diabetic retinopathy, both eyes - exam shows rare MA/IRH - OCT shows mild diabetic macular edema/cystic changes, both eyes -- OD improved, OS interval development of SRF/SRHM/IRHM--conversion to exudative disease  - FA 01.04.21 - late leaking, perifoveal MA OU; no NV OU - monitor  5. Severe amblyopia OS  - history of patching as a child -- no strabismus surgery  - VA dropped down to 20/350 from 20/200 (baseline) due to conv to exu ARMD on 7.26.22  - now back to baseline of 20/200  6. PVD / vitreous syneresis OS  - Discussed findings and prognosis  - No RT or RD on 360 scleral depressed exam  - Reviewed s/s of RT/RD  - Strict return precautions for any such RT/RD signs/symptoms  7. Pseudophakia OU  - s/p CE/IOL OU by [name]  - beautiful surgeries, doing well  - monitor  8. Dry eyes OU - improving  - improved PEE on slit lamp exam  - likely main cause of chief complaint of burning upon waking  - cont artificial tears QID and lubricating ointment qhs as needed  Ophthalmic Meds Ordered this visit:  Meds ordered this encounter  Medications   Bevacizumab (AVASTIN) SOLN 1.25 mg     Return in about 4 weeks (around 10/06/2021) for 4 weeks exu ARMD OS.  There are no Patient Instructions on file for this visit.  This document serves as a record of services personally performed by Karie Chimera, MD, PhD. It was  created on their behalf by Herby Abraham, COA, an ophthalmic technician. The creation of this record is the provider's dictation and/or activities during the visit.    Electronically signed by: Herby Abraham, COA @TODAY @ 1:23 PM  , M.D., Ph.D. Diseases & Surgery of the Retina and Vitreous [facility] 09/08/2021    I have reviewed the above documentation for accuracy and completeness, and I agree with the above. 09/10/2021, M.D., Ph.D. 09/08/21 1:23 PM  Abbreviations: M myopia (nearsighted); A astigmatism; H hyperopia (farsighted); P presbyopia; Mrx spectacle prescription;  CTL contact lenses; OD right eye; OS left eye; OU both eyes  XT exotropia; ET esotropia; PEK punctate epithelial keratitis; PEE punctate epithelial erosions; DES dry eye syndrome; MGD meibomian gland dysfunction; ATs artificial tears; PFAT's preservative free artificial tears; NSC nuclear sclerotic cataract; PSC posterior subcapsular cataract; ERM epi-retinal membrane; PVD posterior vitreous detachment; RD retinal detachment; DM diabetes mellitus; DR diabetic retinopathy; NPDR non-proliferative diabetic retinopathy; PDR proliferative diabetic retinopathy; CSME clinically significant macular edema; DME diabetic macular edema; dbh dot blot hemorrhages; CWS cotton wool spot; POAG primary open angle glaucoma; C/D cup-to-disc ratio; HVF humphrey visual field; GVF goldmann visual field; OCT optical coherence tomography; IOP intraocular  pressure; BRVO Branch retinal vein occlusion; CRVO central retinal vein occlusion; CRAO central retinal artery occlusion; BRAO branch retinal artery occlusion; RT retinal tear; SB scleral buckle; PPV pars plana vitrectomy; VH Vitreous hemorrhage; PRP panretinal laser photocoagulation; IVK intravitreal kenalog; VMT vitreomacular traction; MH Macular hole;  NVD neovascularization of the disc; NVE neovascularization elsewhere; AREDS age related eye disease study; ARMD  age related macular degeneration; POAG primary open angle glaucoma; EBMD epithelial/anterior basement membrane dystrophy; ACIOL anterior chamber intraocular lens; IOL intraocular lens; PCIOL posterior chamber intraocular lens; Phaco/IOL phacoemulsification with intraocular lens placement; PRK photorefractive keratectomy; LASIK laser assisted in situ keratomileusis; HTN hypertension; DM diabetes mellitus; COPD chronic obstructive pulmonary disease

## 2021-09-08 ENCOUNTER — Encounter (INDEPENDENT_AMBULATORY_CARE_PROVIDER_SITE_OTHER): Payer: Self-pay | Admitting: Ophthalmology

## 2021-09-08 ENCOUNTER — Other Ambulatory Visit: Payer: Self-pay

## 2021-09-08 ENCOUNTER — Ambulatory Visit (INDEPENDENT_AMBULATORY_CARE_PROVIDER_SITE_OTHER): Payer: Medicare HMO | Admitting: Ophthalmology

## 2021-09-08 DIAGNOSIS — Z961 Presence of intraocular lens: Secondary | ICD-10-CM

## 2021-09-08 DIAGNOSIS — H353221 Exudative age-related macular degeneration, left eye, with active choroidal neovascularization: Secondary | ICD-10-CM

## 2021-09-08 DIAGNOSIS — H35033 Hypertensive retinopathy, bilateral: Secondary | ICD-10-CM

## 2021-09-08 DIAGNOSIS — H04123 Dry eye syndrome of bilateral lacrimal glands: Secondary | ICD-10-CM | POA: Diagnosis not present

## 2021-09-08 DIAGNOSIS — E119 Type 2 diabetes mellitus without complications: Secondary | ICD-10-CM

## 2021-09-08 DIAGNOSIS — H43812 Vitreous degeneration, left eye: Secondary | ICD-10-CM

## 2021-09-08 DIAGNOSIS — H3581 Retinal edema: Secondary | ICD-10-CM

## 2021-09-08 DIAGNOSIS — E113213 Type 2 diabetes mellitus with mild nonproliferative diabetic retinopathy with macular edema, bilateral: Secondary | ICD-10-CM | POA: Diagnosis not present

## 2021-09-08 DIAGNOSIS — H353132 Nonexudative age-related macular degeneration, bilateral, intermediate dry stage: Secondary | ICD-10-CM

## 2021-09-08 DIAGNOSIS — H353112 Nonexudative age-related macular degeneration, right eye, intermediate dry stage: Secondary | ICD-10-CM

## 2021-09-08 DIAGNOSIS — H53002 Unspecified amblyopia, left eye: Secondary | ICD-10-CM

## 2021-09-08 MED ORDER — BEVACIZUMAB CHEMO INJECTION 1.25MG/0.05ML SYRINGE FOR KALEIDOSCOPE
1.2500 mg | INTRAVITREAL | Status: AC | PRN
  Administered 2021-09-08: 1.25 mg via INTRAVITREAL

## 2021-09-13 DIAGNOSIS — E1129 Type 2 diabetes mellitus with other diabetic kidney complication: Secondary | ICD-10-CM | POA: Diagnosis not present

## 2021-09-13 DIAGNOSIS — I700 Atherosclerosis of aorta: Secondary | ICD-10-CM | POA: Diagnosis not present

## 2021-09-13 DIAGNOSIS — N182 Chronic kidney disease, stage 2 (mild): Secondary | ICD-10-CM | POA: Diagnosis not present

## 2021-09-13 DIAGNOSIS — H353132 Nonexudative age-related macular degeneration, bilateral, intermediate dry stage: Secondary | ICD-10-CM | POA: Diagnosis not present

## 2021-09-13 DIAGNOSIS — I129 Hypertensive chronic kidney disease with stage 1 through stage 4 chronic kidney disease, or unspecified chronic kidney disease: Secondary | ICD-10-CM | POA: Diagnosis not present

## 2021-09-13 DIAGNOSIS — Z23 Encounter for immunization: Secondary | ICD-10-CM | POA: Diagnosis not present

## 2021-09-14 ENCOUNTER — Other Ambulatory Visit (HOSPITAL_COMMUNITY): Payer: Self-pay | Admitting: Internal Medicine

## 2021-09-14 ENCOUNTER — Other Ambulatory Visit: Payer: Self-pay | Admitting: Internal Medicine

## 2021-09-14 DIAGNOSIS — R1032 Left lower quadrant pain: Secondary | ICD-10-CM

## 2021-09-14 DIAGNOSIS — E785 Hyperlipidemia, unspecified: Secondary | ICD-10-CM | POA: Diagnosis not present

## 2021-09-15 ENCOUNTER — Ambulatory Visit (HOSPITAL_COMMUNITY)
Admission: RE | Admit: 2021-09-15 | Discharge: 2021-09-15 | Disposition: A | Discharge status: Home / Self Care | Payer: Medicare HMO | Source: Ambulatory Visit | Attending: Internal Medicine | Admitting: Internal Medicine

## 2021-09-15 ENCOUNTER — Other Ambulatory Visit: Payer: Self-pay

## 2021-09-15 DIAGNOSIS — K449 Diaphragmatic hernia without obstruction or gangrene: Secondary | ICD-10-CM | POA: Diagnosis not present

## 2021-09-15 DIAGNOSIS — E119 Type 2 diabetes mellitus without complications: Secondary | ICD-10-CM | POA: Diagnosis not present

## 2021-09-15 DIAGNOSIS — R1032 Left lower quadrant pain: Secondary | ICD-10-CM | POA: Insufficient documentation

## 2021-09-15 DIAGNOSIS — I10 Essential (primary) hypertension: Secondary | ICD-10-CM | POA: Diagnosis not present

## 2021-09-15 DIAGNOSIS — K429 Umbilical hernia without obstruction or gangrene: Secondary | ICD-10-CM | POA: Diagnosis not present

## 2021-09-15 MED ORDER — IOHEXOL 350 MG/ML SOLN
80.0000 mL | Freq: Once | INTRAVENOUS | Status: AC | PRN
  Administered 2021-09-15: 80 mL via INTRAVENOUS

## 2021-09-27 DIAGNOSIS — M545 Low back pain, unspecified: Secondary | ICD-10-CM | POA: Diagnosis not present

## 2021-10-05 NOTE — Progress Notes (Signed)
[facility] Note  10/06/2021     CHIEF COMPLAINT Patient presents for Retina Follow Up  HISTORY OF PRESENT ILLNESS: Monica Hamilton is a 80 y.o. female who presents to the clinic today for:   HPI     Retina Follow Up   Patient presents with  Wet AMD.  In left eye.  Severity is severe.  Duration of 4 weeks.  Since onset it is stable.  I, the attending physician,  performed the HPI with the patient and updated documentation appropriately.        Comments   4 week retina follow up for wet armd os. Patient states no change in vision noticed.      Last edited by [name], MD on 10/06/2021  4:41 PM.    Pt states her vision is stable  Referring physician: Self-referral -- wife of Lynese Fischel  HISTORICAL INFORMATION:  Selected notes from the MEDICAL RECORD NUMBER Self referral Ocular Hx: pseudophakia OU [name] 2019) PMH: DM (on metformin), HTN, arthritis,    CURRENT MEDICATIONS: No current outpatient medications on file. (Ophthalmic Drugs)   No current facility-administered medications for this visit. (Ophthalmic Drugs)   Current Outpatient Medications (Other)  Medication Sig   amLODipine (NORVASC) 5 MG tablet Take 5 mg by mouth daily.   aspirin 81 MG EC tablet Take 81 mg by mouth daily.    CINNAMON PO Take 1,000 mg by mouth daily.   gabapentin (NEURONTIN) 100 MG capsule gabapentin 100 mg capsule  TAKE 1 CAPSULE BY MOUTH ONCE DAILY AT BEDTIME FOR 30 DAYS   gabapentin (NEURONTIN) 300 MG capsule Take 600 mg by mouth at bedtime.   HYDROcodone-acetaminophen (NORCO) 7.5-325 MG tablet Take 1 tablet by mouth every 6 (six) hours as needed for severe pain ((score 7 to 10)).   metFORMIN (GLUCOPHAGE) 1000 MG tablet Take 1,000 mg by mouth 2 (two) times daily.    methocarbamol (ROBAXIN) 500 MG tablet Take 1 tablet (500 mg total) by mouth every 6 (six) hours as needed for muscle spasms.   metoprolol succinate (TOPROL-XL) 50 MG 24 hr tablet Take 50  mg by mouth daily. Take with or immediately following a meal.   MODERNA COVID-19 VACCINE 100 MCG/0.5ML injection    Multiple Vitamins-Minerals (ICAPS AREDS 2) CAPS Take 1 capsule by mouth 2 (two) times daily.    rosuvastatin (CRESTOR) 20 MG tablet Take by mouth.   valsartan-hydrochlorothiazide (DIOVAN-HCT) 160-25 MG tablet Take 1 tablet by mouth daily.   No current facility-administered medications for this visit. (Other)   REVIEW OF SYSTEMS: ROS   Positive for: Musculoskeletal, Endocrine, Cardiovascular, Eyes Negative for: Constitutional, Gastrointestinal, Neurological, Skin, Genitourinary, HENT, Respiratory, Psychiatric, Allergic/Imm, Heme/Lymph Last edited by Elmore Guise, COT on 10/06/2021  1:19 PM.      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   Past Surgical  History:  Procedure Laterality Date   ABDOMINAL HYSTERECTOMY     APPENDECTOMY     BLADDER SUSPENSION     BREAST BIOPSY Left    BREAST EXCISIONAL BIOPSY Left    CATARACT EXTRACTION     CHOLECYSTECTOMY     DILATION AND CURETTAGE OF UTERUS     EYE SURGERY     HEAD & NECK SKIN LESION EXCISIONAL BIOPSY     top left scalp   LUMBAR LAMINECTOMY/DECOMPRESSION MICRODISCECTOMY N/A 08/21/2019   Procedure: Lumbar decompression L3-4 right, microdiscectomy L3-4 right;  Surgeon: [name], MD;  Location: [facility];  Service: Orthopedics;  Laterality: N/A;  45min   SHOULDER ARTHROSCOPY WITH SUBACROMIAL DECOMPRESSION Right  01/30/2013   Procedure: SHOULDER ARTHROSCOPY WITH SUBACROMIAL DECOMPRESSION;  Surgeon: Magnus Sinning, MD;  Location: [facility];  Service: Orthopedics;  Laterality: Right;  with labral debridement   FAMILY HISTORY Family History  Problem Relation Age of Onset   Heart disease Father    Breast cancer Maternal Aunt    Diabetes Brother    Leukemia Sister    Colon cancer Neg Hx    Esophageal cancer Neg Hx    Rectal cancer Neg Hx    Stomach cancer Neg Hx    SOCIAL HISTORY Social History   Tobacco Use   Smoking status: Never   Smokeless tobacco: Never  Vaping Use   Vaping Use: Never used  Substance Use Topics   Alcohol use: No   Drug use: No       OPHTHALMIC EXAM: Base Eye Exam     Visual Acuity (Snellen - Linear)       Right Left   Dist [place] 20/30+1 20/150-2   Dist ph [place] 20/25-2 20/NI         Tonometry (Tonopen, 1:20 PM)       Right Left   Pressure 16 19         Pupils       Dark Light Shape React APD   Right 3 2 Round Brisk None   Left 3 2 Round Brisk None         Visual Fields (Counting fingers)       Left Right    Full Full         Extraocular Movement       Right Left    Full, Ortho Full, Ortho         Neuro/Psych     Oriented x3: Yes   Mood/Affect: Normal         Dilation     Left eye: 1.0% Mydriacyl, 2.5% Phenylephrine @ 1:20 PM           Slit Lamp and Fundus Exam     Slit Lamp Exam       Right Left   Lids/Lashes Dermatochalasis - upper lid, mild Meibomian gland dysfunction Dermatochalasis - upper lid, Meibomian gland dysfunction   Conjunctiva/Sclera Nasal Pinguecula Nasal Pinguecula   Cornea 1+Punctate epithelial erosions, Arcus, Temporal Well healed cataract wounds, trace Debris in tear film 1+ Punctate epithelial erosions, Arcus, Temporal Well healed cataract wounds, trace Debris in tear film   Anterior Chamber Deep and quiet Deep and quiet   Iris Round and moderately dilated Round and dilated   Lens PC IOL in good  position, 1+ PCO PC IOL in good position, 1-2+Posterior capsular opacification   Vitreous Vitreous syneresis, Posterior vitreous detachment, vitreous condensations Vitreous syneresis, Posterior vitreous detachment         Fundus Exam  Right Left   Disc Pink and Sharp Pink and Sharp, mild temporal Peripapillary atrophy, mild PPP   C/D Ratio 0.4 0.4   Macula Flat, Blunted foveal reflex, RPE mottling and clumping, mild atrophy, Drusen, rare MA/IRH, No edema Blunted foveal reflex, central edema w/+heme - improved, +CNV w/ heme - improved -- now with pigment ring, +drusen, RPE mottling   Vessels attenuated, mild tortuousity attenuated, mild tortuousity   Periphery Attached, scattered mild Reticular degeneration.  No heme. Attached, scattered Reticular degeneration, No heme.           IMAGING AND PROCEDURES  Imaging and Procedures for @TODAY @  OCT, Retina - OU - Both Eyes       Right Eye Quality was good. Central Foveal Thickness: 283. Progression has been stable. Findings include normal foveal contour, no SRF, retinal drusen , outer retinal atrophy, no IRF, myopic contour (Trace cystic changes IT fovea stably improved).   Left Eye Quality was good. Central Foveal Thickness: 410. Progression has improved. Findings include abnormal foveal contour, retinal drusen , pigment epithelial detachment, intraretinal fluid, outer retinal atrophy, subretinal fluid, intraretinal hyper-reflective material, subretinal hyper-reflective material (Interval improvement in SRF, [facility], PED and overlying IRF).   Notes *Images captured and stored on drive  Diagnosis / Impression: Mild DME OU OD: nonexudative ARMD - stable drusen OS: Exudative ARMD - Interval improvement in SRF, [facility], PED and overlying IRF  Clinical management:  See below  Abbreviations: NFP - Normal foveal profile. CME - cystoid macular edema. PED - pigment epithelial detachment. IRF - intraretinal fluid. SRF - subretinal fluid. EZ  - ellipsoid zone. ERM - epiretinal membrane. ORA - outer retinal atrophy. ORT - outer retinal tubulation. SRHM - subretinal hyper-reflective material       Intravitreal Injection, Pharmacologic Agent - OS - Left Eye       Time Out 10/06/2021. 1:25 PM. Confirmed correct patient, procedure, site, and patient consented.   Anesthesia Topical anesthesia was used. Anesthetic medications included Lidocaine 2%, Proparacaine 0.5%.   Procedure Preparation included 5% betadine to ocular surface, eyelid speculum. A supplied needle was used.   Injection: 1.25 mg Bevacizumab 1.25mg /0.60ml   Route: Intravitreal, Site: Left Eye   NDC: 0m, Lot: [id number], Expiration date: 11/24/2021, Waste: 0.05 mL   Post-op Post injection exam found visual acuity of at least counting fingers. The patient tolerated the procedure well. There were no complications. The patient received written and verbal post procedure care education.            ASSESSMENT/PLAN:   ICD-10-CM   1. Exudative age-related macular degeneration of left eye with active choroidal neovascularization (HCC)  H35.3221 Intravitreal Injection, Pharmacologic Agent - OS - Left Eye    Bevacizumab (AVASTIN) SOLN 1.25 mg    2. Retinal edema  H35.81 OCT, Retina - OU - Both Eyes    3. Intermediate stage nonexudative age-related macular degeneration of right eye  H35.3112     4. Amblyopia of left eye  H53.002     5. Posterior vitreous detachment of left eye  H43.812     6. Both eyes affected by mild nonproliferative diabetic retinopathy with macular edema, associated with type 2 diabetes mellitus (HCC)  03-14-2002     7. Pseudophakia of both eyes  Z96.1     8. Dry eyes  H04.123      1,2. Exudative age related macular degeneration, OS   - interval conversion to exudative ARMD noted on 7.26.22             -  s/p IVA OS #1 (07.26.22), #2 (08.23.22), #3 (09.21.22), #4 (10.13.22)  - BCVA 20/150  - OCT shows Interval improvement in SRF,  SRHM, PED and overlying IRF at 4 wks  - recommend IVA OS #5 today, 11.16.22  - pt wishes to be treated with IVA - RBA of procedure discussed, questions answered - informed consent obtained and signed - see procedure note   - f/u in 4 wks -- DFE/OCT/likely inj.  3. Intermediate Age related macular degeneration, non-exudative, OD -- stable  - exam/OCT today shows OD: stable drusen  - suspect cystic changes mostly related to DM and HTN  - BCVA OD 20/25  - FA 01.04.21 - no CNVM OU -- no exudative disease  - continue amsler grid monitoring  4. Mild non-proliferative diabetic retinopathy, both eyes - exam shows rare MA/IRH - OCT shows mild diabetic macular edema/cystic changes, both eyes -- OD improved, OS interval development of SRF/SRHM/IRHM--conversion to exudative disease  - FA 01.04.21 - late leaking, perifoveal MA OU; no NV OU - monitor  5. Severe amblyopia OS  - history of patching as a child -- no strabismus surgery  - VA dropped down to 20/350 from 20/200 (baseline) due to conv to exu ARMD on 7.26.22  - now today, OS improved to 20/150  6. PVD / vitreous syneresis OS  - Discussed findings and prognosis  - No RT or RD on 360 scleral depressed exam  - Reviewed s/s of RT/RD  - Strict return precautions for any such RT/RD signs/symptoms  7. Pseudophakia OU  - s/p CE/IOL OU by [name]  - beautiful surgeries, doing well  - monitor  8. Dry eyes OU - improving  - improved PEE on slit lamp exam  - likely main cause of chief complaint of burning upon waking  - cont artificial tears QID and lubricating ointment qhs as needed  Ophthalmic Meds Ordered this visit:  Meds ordered this encounter  Medications   Bevacizumab (AVASTIN) SOLN 1.25 mg      Return in about 4 weeks (around 11/03/2021) for f/u exu ARMD OU, DFE, OCT.  There are no Patient Instructions on file for this visit.  This document serves as a record of services personally performed by Gardiner Sleeper, MD, PhD. It  was created on their behalf by Orvan Falconer, an ophthalmic technician. The creation of this record is the provider's dictation and/or activities during the visit.    Electronically signed by: Orvan Falconer, OA, 10/06/21  4:47 PM  This document serves as a record of services personally performed by Gardiner Sleeper, MD, PhD. It was created on their behalf by San Jetty. Owens Shark, OA an ophthalmic technician. The creation of this record is the provider's dictation and/or activities during the visit.    Electronically signed by: San Jetty. Owens Shark, [place] 11.16.2022 4:47 PM  Gardiner Sleeper, M.D., Ph.D. Diseases & Surgery of the Retina and Vitreous [facility]  I have reviewed the above documentation for accuracy and completeness, and I agree with the above. Gardiner Sleeper, M.D., Ph.D. 10/06/21 4:47 PM   Abbreviations: M myopia (nearsighted); A astigmatism; H hyperopia (farsighted); P presbyopia; Mrx spectacle prescription;  CTL contact lenses; OD right eye; OS left eye; OU both eyes  XT exotropia; ET esotropia; PEK punctate epithelial keratitis; PEE punctate epithelial erosions; DES dry eye syndrome; MGD meibomian gland dysfunction; ATs artificial tears; PFAT's preservative free artificial tears; [place] nuclear sclerotic cataract; PSC posterior subcapsular cataract; ERM epi-retinal membrane; PVD  posterior vitreous detachment; RD retinal detachment; DM diabetes mellitus; DR diabetic retinopathy; NPDR non-proliferative diabetic retinopathy; PDR proliferative diabetic retinopathy; CSME clinically significant macular edema; DME diabetic macular edema; dbh dot blot hemorrhages; CWS cotton wool spot; POAG primary open angle glaucoma; C/D cup-to-disc ratio; HVF humphrey visual field; GVF goldmann visual field; OCT optical coherence tomography; IOP intraocular pressure; BRVO Branch retinal vein occlusion; CRVO central retinal vein occlusion; CRAO central retinal artery occlusion; BRAO branch  retinal artery occlusion; RT retinal tear; SB scleral buckle; PPV pars plana vitrectomy; VH Vitreous hemorrhage; PRP panretinal laser photocoagulation; IVK intravitreal kenalog; VMT vitreomacular traction; MH Macular hole;  NVD neovascularization of the disc; NVE neovascularization elsewhere; AREDS age related eye disease study; ARMD age related macular degeneration; POAG primary open angle glaucoma; EBMD epithelial/anterior basement membrane dystrophy; ACIOL anterior chamber intraocular lens; IOL intraocular lens; PCIOL posterior chamber intraocular lens; Phaco/IOL phacoemulsification with intraocular lens placement; [place] photorefractive keratectomy; LASIK laser assisted in situ keratomileusis; HTN hypertension; DM diabetes mellitus; COPD chronic obstructive pulmonary disease

## 2021-10-06 ENCOUNTER — Ambulatory Visit (INDEPENDENT_AMBULATORY_CARE_PROVIDER_SITE_OTHER): Payer: Medicare HMO | Admitting: Ophthalmology

## 2021-10-06 ENCOUNTER — Other Ambulatory Visit: Payer: Self-pay

## 2021-10-06 ENCOUNTER — Encounter (INDEPENDENT_AMBULATORY_CARE_PROVIDER_SITE_OTHER): Payer: Self-pay | Admitting: Ophthalmology

## 2021-10-06 DIAGNOSIS — H43812 Vitreous degeneration, left eye: Secondary | ICD-10-CM | POA: Diagnosis not present

## 2021-10-06 DIAGNOSIS — Z961 Presence of intraocular lens: Secondary | ICD-10-CM | POA: Diagnosis not present

## 2021-10-06 DIAGNOSIS — H353112 Nonexudative age-related macular degeneration, right eye, intermediate dry stage: Secondary | ICD-10-CM | POA: Diagnosis not present

## 2021-10-06 DIAGNOSIS — E113213 Type 2 diabetes mellitus with mild nonproliferative diabetic retinopathy with macular edema, bilateral: Secondary | ICD-10-CM

## 2021-10-06 DIAGNOSIS — H3581 Retinal edema: Secondary | ICD-10-CM

## 2021-10-06 DIAGNOSIS — H04123 Dry eye syndrome of bilateral lacrimal glands: Secondary | ICD-10-CM | POA: Diagnosis not present

## 2021-10-06 DIAGNOSIS — H353132 Nonexudative age-related macular degeneration, bilateral, intermediate dry stage: Secondary | ICD-10-CM

## 2021-10-06 DIAGNOSIS — H53002 Unspecified amblyopia, left eye: Secondary | ICD-10-CM

## 2021-10-06 DIAGNOSIS — H35033 Hypertensive retinopathy, bilateral: Secondary | ICD-10-CM

## 2021-10-06 DIAGNOSIS — H353221 Exudative age-related macular degeneration, left eye, with active choroidal neovascularization: Secondary | ICD-10-CM | POA: Diagnosis not present

## 2021-10-06 MED ORDER — BEVACIZUMAB CHEMO INJECTION 1.25MG/0.05ML SYRINGE FOR KALEIDOSCOPE
1.2500 mg | INTRAVITREAL | Status: AC | PRN
  Administered 2021-10-06: 1.25 mg via INTRAVITREAL

## 2021-11-01 NOTE — Progress Notes (Signed)
[name] Note  11/03/2021     CHIEF COMPLAINT Patient presents for Retina Follow Up  HISTORY OF PRESENT ILLNESS: Monica Hamilton is a 80 y.o. female who presents to the clinic today for:   HPI     Retina Follow Up   Patient presents with  Wet AMD.  In both eyes.  Severity is moderate.  Duration of 4 weeks.  Since onset it is stable.  I, the attending physician,  performed the HPI with the patient and updated documentation appropriately.        Comments   Pt here for 4 wk exu ARMD OS. Pt states vision is the same, no changes noticed. She does request just being dilated OS today due to driving herself. Last A1C was 6.2, doesn't check blood sugar daily.       Last edited by [name], MD on 11/04/2021 11:19 PM.    Pt states she cannot tell any difference in her vision  Referring physician: Self-referral -- wife of Monica Hamilton  HISTORICAL INFORMATION:  Selected notes from the MEDICAL RECORD NUMBER Self referral Ocular Hx: pseudophakia OU Monica Hamilton 2019) PMH: DM (on metformin), HTN, arthritis,    CURRENT MEDICATIONS: No current outpatient medications on file. (Ophthalmic Drugs)   No current facility-administered medications for this visit. (Ophthalmic Drugs)   Current Outpatient Medications (Other)  Medication Sig   amLODipine (NORVASC) 5 MG tablet Take 5 mg by mouth daily.   aspirin 81 MG EC tablet Take 81 mg by mouth daily.    CINNAMON PO Take 1,000 mg by mouth daily.   gabapentin (NEURONTIN) 100 MG capsule gabapentin 100 mg capsule  TAKE 1 CAPSULE BY MOUTH ONCE DAILY AT BEDTIME FOR 30 DAYS   gabapentin (NEURONTIN) 300 MG capsule Take 600 mg by mouth at bedtime.   HYDROcodone-acetaminophen (NORCO) 7.5-325 MG tablet Take 1 tablet by mouth every 6 (six) hours as needed for severe pain ((score 7 to 10)).   metFORMIN (GLUCOPHAGE) 1000 MG tablet Take 1,000 mg by mouth 2 (two) times daily.    methocarbamol (ROBAXIN) 500 MG tablet Take 1  tablet (500 mg total) by mouth every 6 (six) hours as needed for muscle spasms.   metoprolol succinate (TOPROL-XL) 50 MG 24 hr tablet Take 50 mg by mouth daily. Take with or immediately following a meal.   MODERNA COVID-19 VACCINE 100 MCG/0.5ML injection    Multiple Vitamins-Minerals (ICAPS AREDS 2) CAPS Take 1 capsule by mouth 2 (two) times daily.    rosuvastatin (CRESTOR) 20 MG tablet Take by mouth.   valsartan-hydrochlorothiazide (DIOVAN-HCT) 160-25 MG tablet Take 1 tablet by mouth daily.   No current facility-administered medications for this visit. (Other)   REVIEW OF SYSTEMS: ROS   Positive for: Musculoskeletal, Endocrine, Cardiovascular, Eyes Negative for: Constitutional, Gastrointestinal, Neurological, Skin, Genitourinary, HENT, Respiratory, Psychiatric, Allergic/Imm, Heme/Lymph Last edited by Kingsley Spittle, COT on 11/03/2021 12:21 PM.     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   Past Surgical History:  Procedure Laterality Date   ABDOMINAL HYSTERECTOMY     APPENDECTOMY     BLADDER SUSPENSION     BREAST BIOPSY Left    BREAST EXCISIONAL BIOPSY Left    CATARACT EXTRACTION     CHOLECYSTECTOMY     DILATION AND CURETTAGE OF UTERUS     EYE SURGERY     HEAD & NECK SKIN LESION EXCISIONAL BIOPSY     top left scalp   LUMBAR LAMINECTOMY/DECOMPRESSION MICRODISCECTOMY N/A 08/21/2019   Procedure: Lumbar decompression L3-4 right, microdiscectomy L3-4 right;  Surgeon:  [name], MD;  Location: [facility];  Service: Orthopedics;  Laterality: N/A;  25min   SHOULDER ARTHROSCOPY WITH SUBACROMIAL DECOMPRESSION Right 01/30/2013   Procedure: SHOULDER ARTHROSCOPY WITH SUBACROMIAL DECOMPRESSION;  Surgeon: Magnus Sinning, MD;  Location: [facility];  Service: Orthopedics;  Laterality: Right;  with labral debridement   FAMILY HISTORY Family History  Problem Relation Age of Onset   Heart disease Father    Breast cancer Maternal Aunt    Diabetes Brother    Leukemia Sister    Colon cancer Neg Hx    Esophageal cancer Neg Hx    Rectal cancer Neg Hx    Stomach cancer Neg Hx    SOCIAL HISTORY Social History   Tobacco Use   Smoking status: Never   Smokeless tobacco: Never  Vaping Use   Vaping Use: Never used  Substance Use Topics   Alcohol use: No   Drug use: No       OPHTHALMIC EXAM: Base Eye Exam     Visual Acuity (Snellen - Linear)       Right Left   Dist [place] 20/30 -2 20/200 -2   Dist ph cc 20/30 +1 NI  Pt could only see the letters on the right side of acuity chart. MS        Tonometry (Tonopen, 12:34 PM)       Right Left   Pressure 16 21         Pupils       Dark Light Shape React APD   Right 3 2 Round Brisk None   Left 3 2 Round Brisk None         Visual Fields (Counting fingers)       Left Right    Full Full         Extraocular Movement       Right Left    Full, Ortho Full, Ortho         Neuro/Psych     Oriented x3: Yes   Mood/Affect: Normal         Dilation     Left eye: 1.0% Mydriacyl, 2.5% Phenylephrine @ 12:34 PM           Slit Lamp and Fundus Exam     Slit Lamp Exam       Right Left   Lids/Lashes Dermatochalasis - upper lid, mild Meibomian gland dysfunction Dermatochalasis - upper lid, Meibomian gland dysfunction   Conjunctiva/Sclera Nasal Pinguecula Nasal Pinguecula   Cornea 1+Punctate epithelial erosions, Arcus, Temporal Well healed cataract wounds, trace Debris in tear film 1+ Punctate  epithelial erosions, Arcus, Temporal Well healed cataract wounds, trace Debris in tear film   Anterior Chamber Deep and quiet Deep and quiet   Iris Round and moderately dilated Round and dilated   Lens  PC IOL in good position, 1+ PCO PC IOL in good position, 1-2+Posterior capsular opacification   Anterior Vitreous Vitreous syneresis, Posterior vitreous detachment, vitreous condensations Vitreous syneresis, Posterior vitreous detachment         Fundus Exam       Right Left   Disc Pink and Sharp Pink and Sharp, mild temporal Peripapillary atrophy, mild PPP   C/D Ratio 0.4 0.4   Macula Flat, Blunted foveal reflex, RPE mottling and clumping, mild atrophy, Drusen, rare MA/IRH, No edema Blunted foveal reflex, central edema w/+heme - improved, +CNV w/ heme - improved -- now with pigment ring, +drusen, RPE mottling   Vessels attenuated, mild tortuousity attenuated, mild tortuousity   Periphery Attached, scattered mild Reticular degeneration.  No heme. Attached, scattered Reticular degeneration, No heme.           Refraction     Manifest Refraction       Sphere Cylinder Dist VA   Right [place] Sphere 20/30-2   Left [place] Sphere 20/200-2           IMAGING AND PROCEDURES  Imaging and Procedures for @TODAY @  OCT, Retina - OU - Both Eyes       Right Eye Quality was poor. Central Foveal Thickness: 282. Progression has been stable. Findings include normal foveal contour, no SRF, retinal drusen , outer retinal atrophy, no IRF, myopic contour (Trace cystic changes IT fovea).   Left Eye Quality was good. Central Foveal Thickness: 322. Progression has improved (Interval improvement in SRF, SRHM and IRF). Findings include abnormal foveal contour, retinal drusen , pigment epithelial detachment, intraretinal fluid, outer retinal atrophy, subretinal fluid, intraretinal hyper-reflective material, subretinal hyper-reflective material.   Notes *Images captured and stored on drive  Diagnosis /  Impression: Mild DME OU OD: nonexudative ARMD - stable drusen OS: Exudative ARMD - Interval improvement in SRF, SRHM and IRF  Clinical management:  See below  Abbreviations: NFP - Normal foveal profile. CME - cystoid macular edema. PED - pigment epithelial detachment. IRF - intraretinal fluid. SRF - subretinal fluid. EZ - ellipsoid zone. ERM - epiretinal membrane. ORA - outer retinal atrophy. ORT - outer retinal tubulation. SRHM - subretinal hyper-reflective material       Intravitreal Injection, Pharmacologic Agent - OS - Left Eye       Time Out 11/03/2021. 1:16 PM. Confirmed correct patient, procedure, site, and patient consented.   Anesthesia Topical anesthesia was used. Anesthetic medications included Lidocaine 2%, Proparacaine 0.5%.   Procedure Preparation included 5% betadine to ocular surface, eyelid speculum. A supplied needle was used.   Injection: 1.25 mg Bevacizumab 1.25mg /0.75ml   Route: Intravitreal, Site: Left Eye   NDC: H061816, Lot: [id number] , Expiration date: 12/28/2021, Waste: 0 mL   Post-op Post injection exam found visual acuity of at least counting fingers. The patient tolerated the procedure well. There were no complications. The patient received written and verbal post procedure care education.            ASSESSMENT/PLAN:   ICD-10-CM   1. Exudative age-related macular degeneration of left eye with active choroidal neovascularization (HCC)  H35.3221 OCT, Retina - OU - Both Eyes    Intravitreal Injection, Pharmacologic Agent - OS - Left Eye    Bevacizumab (AVASTIN) SOLN 1.25 mg    2. Intermediate stage nonexudative age-related macular degeneration of right eye  H35.3112     3. Amblyopia of left eye  H53.002     4. Posterior vitreous detachment of left eye  H43.812  5. Both eyes affected by mild nonproliferative diabetic retinopathy with macular edema, associated with type 2 diabetes mellitus (HCC)  D78.2423     6. Pseudophakia of both  eyes  Z96.1     7. Dry eyes  H04.123       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, OS   - interval conversion to exudative ARMD noted on 7.26.22             - s/p IVA OS #1 (07.26.22), #2 (08.23.22), #3 (09.21.22), #4 (10.13.22), #5 (11.16.22)  - BCVA 20/200  - OCT shows interval improvement in SRF, SRHM and IRF at 4 wks  - recommend IVA OS #6 today, 12.14.22  - pt wishes to be treated with IVA - RBA of procedure discussed, questions answered - informed consent obtained and signed - see procedure note   - f/u in 4 wks -- DFE/OCT/likely inj.  2. Intermediate Age related macular degeneration, non-exudative, OD -- stable  - exam/OCT today shows OD: stable drusen  - suspect cystic changes mostly related to DM and HTN  - BCVA OD 20/25  - FA 01.04.21 - no CNVM OU -- no exudative disease  - continue amsler grid monitoring  3. Mild non-proliferative diabetic retinopathy, both eyes - exam shows rare MA/IRH - OCT shows mild diabetic macular edema/cystic changes, both eyes  - FA 01.04.21 - late leaking, perifoveal MA OU; no NV OU - monitor  4. Severe amblyopia OS  - history of patching as a child -- no strabismus surgery  - VA dropped down to 20/350 from 20/200 (baseline) due to conv to exu ARMD on 7.26.22  - now back to baseline -- 20/200 OS  5. PVD / vitreous syneresis OS  - Discussed findings and prognosis  - No RT or RD on 360 scleral depressed exam  - Reviewed s/s of RT/RD  - Strict return precautions for any such RT/RD signs/symptoms  6. Pseudophakia OU  - s/p CE/IOL OU by [name]  - beautiful surgeries, doing well  - monitor  7. Dry eyes OU - improving  - improved PEE on slit lamp  - likely main cause of chief complaint of burning upon waking  - cont artificial tears QID and lubricating ointment qhs as needed  Ophthalmic Meds Ordered this visit:  Meds ordered this encounter  Medications   Bevacizumab (AVASTIN) SOLN 1.25 mg     Return in about 4 weeks  (around 12/01/2021) for f/u exu ARMD OS, DFE, OCT.  There are no Patient Instructions on file for this visit.  This document serves as a record of services personally performed by Karie Chimera, MD, PhD. It was created on their behalf by De Blanch, an ophthalmic technician. The creation of this record is the provider's dictation and/or activities during the visit.    Electronically signed by: De Blanch, OA, 11/04/21  11:21 PM  This document serves as a record of services personally performed by Karie Chimera, MD, PhD. It was created on their behalf by Glee Arvin. Manson Passey, OA an ophthalmic technician. The creation of this record is the provider's dictation and/or activities during the visit.    Electronically signed by: Glee Arvin. Manson Passey, [place] 12.14.2022 11:21 PM  Karie Chimera, M.D., Ph.D. Diseases & Surgery of the Retina and Vitreous [facility]  I have reviewed the above documentation for accuracy and completeness, and I agree with the above. Karie Chimera, M.D., Ph.D. 11/04/21 11:27 PM   Abbreviations: Judie Petit  myopia (nearsighted); A astigmatism; H hyperopia (farsighted); P presbyopia; Mrx spectacle prescription;  CTL contact lenses; OD right eye; OS left eye; OU both eyes  XT exotropia; ET esotropia; PEK punctate epithelial keratitis; PEE punctate epithelial erosions; DES dry eye syndrome; MGD meibomian gland dysfunction; ATs artificial tears; PFAT's preservative free artificial tears; [place] nuclear sclerotic cataract; PSC posterior subcapsular cataract; ERM epi-retinal membrane; PVD posterior vitreous detachment; RD retinal detachment; DM diabetes mellitus; DR diabetic retinopathy; NPDR non-proliferative diabetic retinopathy; PDR proliferative diabetic retinopathy; CSME clinically significant macular edema; DME diabetic macular edema; dbh dot blot hemorrhages; CWS cotton wool spot; POAG primary open angle glaucoma; C/D cup-to-disc ratio; HVF humphrey visual field;  GVF goldmann visual field; OCT optical coherence tomography; IOP intraocular pressure; BRVO Branch retinal vein occlusion; CRVO central retinal vein occlusion; CRAO central retinal artery occlusion; BRAO branch retinal artery occlusion; RT retinal tear; SB scleral buckle; PPV pars plana vitrectomy; VH Vitreous hemorrhage; PRP panretinal laser photocoagulation; IVK intravitreal kenalog; VMT vitreomacular traction; MH Macular hole;  NVD neovascularization of the disc; NVE neovascularization elsewhere; AREDS age related eye disease study; ARMD age related macular degeneration; POAG primary open angle glaucoma; EBMD epithelial/anterior basement membrane dystrophy; ACIOL anterior chamber intraocular lens; IOL intraocular lens; PCIOL posterior chamber intraocular lens; Phaco/IOL phacoemulsification with intraocular lens placement; [place] photorefractive keratectomy; LASIK laser assisted in situ keratomileusis; HTN hypertension; DM diabetes mellitus; COPD chronic obstructive pulmonary disease

## 2021-11-03 ENCOUNTER — Encounter (INDEPENDENT_AMBULATORY_CARE_PROVIDER_SITE_OTHER): Payer: Self-pay | Admitting: Ophthalmology

## 2021-11-03 ENCOUNTER — Other Ambulatory Visit: Payer: Self-pay

## 2021-11-03 ENCOUNTER — Ambulatory Visit (INDEPENDENT_AMBULATORY_CARE_PROVIDER_SITE_OTHER): Payer: Medicare HMO | Admitting: Ophthalmology

## 2021-11-03 DIAGNOSIS — H3581 Retinal edema: Secondary | ICD-10-CM

## 2021-11-03 DIAGNOSIS — H53002 Unspecified amblyopia, left eye: Secondary | ICD-10-CM

## 2021-11-03 DIAGNOSIS — H353221 Exudative age-related macular degeneration, left eye, with active choroidal neovascularization: Secondary | ICD-10-CM

## 2021-11-03 DIAGNOSIS — E113213 Type 2 diabetes mellitus with mild nonproliferative diabetic retinopathy with macular edema, bilateral: Secondary | ICD-10-CM | POA: Diagnosis not present

## 2021-11-03 DIAGNOSIS — H04123 Dry eye syndrome of bilateral lacrimal glands: Secondary | ICD-10-CM | POA: Diagnosis not present

## 2021-11-03 DIAGNOSIS — H43812 Vitreous degeneration, left eye: Secondary | ICD-10-CM | POA: Diagnosis not present

## 2021-11-03 DIAGNOSIS — Z961 Presence of intraocular lens: Secondary | ICD-10-CM | POA: Diagnosis not present

## 2021-11-03 DIAGNOSIS — H353112 Nonexudative age-related macular degeneration, right eye, intermediate dry stage: Secondary | ICD-10-CM | POA: Diagnosis not present

## 2021-11-04 ENCOUNTER — Encounter (INDEPENDENT_AMBULATORY_CARE_PROVIDER_SITE_OTHER): Payer: Self-pay | Admitting: Ophthalmology

## 2021-11-04 DIAGNOSIS — H353221 Exudative age-related macular degeneration, left eye, with active choroidal neovascularization: Secondary | ICD-10-CM | POA: Diagnosis not present

## 2021-11-04 DIAGNOSIS — H04123 Dry eye syndrome of bilateral lacrimal glands: Secondary | ICD-10-CM | POA: Diagnosis not present

## 2021-11-04 DIAGNOSIS — E113213 Type 2 diabetes mellitus with mild nonproliferative diabetic retinopathy with macular edema, bilateral: Secondary | ICD-10-CM | POA: Diagnosis not present

## 2021-11-04 DIAGNOSIS — H353112 Nonexudative age-related macular degeneration, right eye, intermediate dry stage: Secondary | ICD-10-CM | POA: Diagnosis not present

## 2021-11-04 DIAGNOSIS — H43812 Vitreous degeneration, left eye: Secondary | ICD-10-CM | POA: Diagnosis not present

## 2021-11-04 DIAGNOSIS — Z961 Presence of intraocular lens: Secondary | ICD-10-CM | POA: Diagnosis not present

## 2021-11-04 MED ORDER — BEVACIZUMAB CHEMO INJECTION 1.25MG/0.05ML SYRINGE FOR KALEIDOSCOPE
1.2500 mg | INTRAVITREAL | Status: AC | PRN
  Administered 2021-11-04: 1.25 mg via INTRAVITREAL

## 2021-11-30 NOTE — Progress Notes (Signed)
[facility] - Clinic Note  12/01/2021     CHIEF COMPLAINT Patient presents for Retina Follow Up   HISTORY OF PRESENT ILLNESS: Monica Hamilton is a 81 y.o. female who presents to the clinic today for:   HPI     Retina Follow Up   Patient presents with  Wet AMD.  In left eye.  This started 4 weeks ago.  I, the attending physician,  performed the HPI with the patient and updated documentation appropriately.        Comments   Patient here for 4 weeks retina follow up for exu ARMD OS. Patient states vision cant tell any difference. No eye pain.       Last edited by Rennis Chris, MD on 12/02/2021 11:01 PM.      Referring physician: Self-referral -- wife of Jesusita Oka  HISTORICAL INFORMATION:  Selected notes from the MEDICAL RECORD NUMBER Self referral Ocular Hx: pseudophakia OU [name] 2019) PMH: DM (on metformin), HTN, arthritis,    CURRENT MEDICATIONS: No current outpatient medications on file. (Ophthalmic Drugs)   No current facility-administered medications for this visit. (Ophthalmic Drugs)   Current Outpatient Medications (Other)  Medication Sig   amLODipine (NORVASC) 5 MG tablet Take 5 mg by mouth daily.   aspirin 81 MG EC tablet Take 81 mg by mouth daily.    CINNAMON PO Take 1,000 mg by mouth daily.   gabapentin (NEURONTIN) 100 MG capsule gabapentin 100 mg capsule  TAKE 1 CAPSULE BY MOUTH ONCE DAILY AT BEDTIME FOR 30 DAYS   gabapentin (NEURONTIN) 300 MG capsule Take 600 mg by mouth at bedtime.   HYDROcodone-acetaminophen (NORCO) 7.5-325 MG tablet Take 1 tablet by mouth every 6 (six) hours as needed for severe pain ((score 7 to 10)).   metFORMIN (GLUCOPHAGE) 1000 MG tablet Take 1,000 mg by mouth 2 (two) times daily.    methocarbamol (ROBAXIN) 500 MG tablet Take 1 tablet (500 mg total) by mouth every 6 (six) hours as needed for muscle spasms.   metoprolol succinate (TOPROL-XL) 50 MG 24 hr tablet Take 50 mg by mouth daily. Take with or  immediately following a meal.   MODERNA COVID-19 VACCINE 100 MCG/0.5ML injection    Multiple Vitamins-Minerals (ICAPS AREDS 2) CAPS Take 1 capsule by mouth 2 (two) times daily.    rosuvastatin (CRESTOR) 20 MG tablet Take by mouth.   valsartan-hydrochlorothiazide (DIOVAN-HCT) 160-25 MG tablet Take 1 tablet by mouth daily.   No current facility-administered medications for this visit. (Other)   REVIEW OF SYSTEMS: ROS   Positive for: Musculoskeletal, Endocrine, Cardiovascular, Eyes Negative for: Constitutional, Gastrointestinal, Neurological, Skin, Genitourinary, HENT, Respiratory, Psychiatric, Allergic/Imm, Heme/Lymph Last edited by Laddie Aquas, COA on 12/01/2021 12:55 PM.      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31, led to Mitral valve prolapse   Past Surgical History:  Procedure Laterality Date   ABDOMINAL  HYSTERECTOMY     APPENDECTOMY     BLADDER SUSPENSION     BREAST BIOPSY Left    BREAST EXCISIONAL BIOPSY Left    CATARACT EXTRACTION     CHOLECYSTECTOMY     DILATION AND CURETTAGE OF UTERUS     EYE SURGERY     HEAD & NECK SKIN LESION EXCISIONAL BIOPSY     top left scalp   LUMBAR LAMINECTOMY/DECOMPRESSION MICRODISCECTOMY N/A 08/21/2019   Procedure: Lumbar decompression L3-4 right, microdiscectomy L3-4 right;  Surgeon: [name], MD;  Location: [facility];  Service: Orthopedics;  Laterality: N/A;  30min   SHOULDER ARTHROSCOPY WITH SUBACROMIAL DECOMPRESSION Right 01/30/2013   Procedure:  SHOULDER ARTHROSCOPY WITH SUBACROMIAL DECOMPRESSION;  Surgeon: Magnus Sinning, MD;  Location: [facility];  Service: Orthopedics;  Laterality: Right;  with labral debridement   FAMILY HISTORY Family History  Problem Relation Age of Onset   Heart disease Father    Breast cancer Maternal Aunt    Diabetes Brother    Leukemia Sister    Colon cancer Neg Hx    Esophageal cancer Neg Hx    Rectal cancer Neg Hx    Stomach cancer Neg Hx    SOCIAL HISTORY Social History   Tobacco Use   Smoking status: Never   Smokeless tobacco: Never  Vaping Use   Vaping Use: Never used  Substance Use Topics   Alcohol use: No   Drug use: No       OPHTHALMIC EXAM: Base Eye Exam     Visual Acuity (Snellen - Linear)       Right Left   Dist [place] 20/30 20/350 +1   Dist ph [place] 20/25 -2          Tonometry (Tonopen, 12:52 PM)       Right Left   Pressure 17 19         Pupils       Dark Light Shape React APD   Right 3 2 Round Brisk None   Left 3 2 Round Brisk None         Visual Fields (Counting fingers)       Left Right    Full Full         Extraocular Movement       Right Left    Full Full         Neuro/Psych     Oriented x3: Yes   Mood/Affect: Normal         Dilation     Both eyes: 1.0% Mydriacyl, 2.5% Phenylephrine @ 12:52 PM           Slit Lamp and Fundus Exam     Slit Lamp Exam       Right Left   Lids/Lashes Dermatochalasis - upper lid, mild Meibomian gland dysfunction Dermatochalasis - upper lid, Meibomian gland dysfunction   Conjunctiva/Sclera Nasal Pinguecula Nasal Pinguecula   Cornea 1+Punctate epithelial erosions, Arcus, Temporal Well healed cataract wounds, trace Debris in tear film 1+ Punctate epithelial erosions, Arcus, Temporal Well healed cataract wounds, trace Debris in tear film   Anterior Chamber Deep and quiet Deep and quiet   Iris Round and moderately dilated Round and dilated   Lens PC IOL in good position, 1+ PCO PC IOL in good position,  1-2+Posterior capsular opacification   Anterior Vitreous Vitreous syneresis, Posterior vitreous detachment, vitreous condensations Vitreous syneresis, Posterior vitreous detachment         Fundus Exam       Right  Left   Disc Pink and Sharp Pink and Sharp, mild temporal Peripapillary atrophy, mild PPP   C/D Ratio 0.4 0.4   Macula Flat, Blunted foveal reflex, RPE mottling and clumping, mild atrophy, Drusen, rare MA/IRH, No edema Blunted foveal reflex, central cystic changes / edema - improved, +CNV now with pigment ring, no heme, +drusen, RPE mottling   Vessels attenuated, mild tortuousity attenuated, mild tortuousity   Periphery Attached, scattered mild Reticular degeneration.  No heme. Attached, scattered Reticular degeneration, No heme.           IMAGING AND PROCEDURES  Imaging and Procedures for @TODAY @  OCT, Retina - OU - Both Eyes       Right Eye Quality was good. Central Foveal Thickness: 287. Progression has been stable. Findings include normal foveal contour, no SRF, retinal drusen , outer retinal atrophy, no IRF, myopic contour (Trace cystic changes IT fovea).   Left Eye Quality was good. Central Foveal Thickness: 305. Progression has improved. Findings include abnormal foveal contour, retinal drusen , pigment epithelial detachment, intraretinal fluid, outer retinal atrophy, subretinal fluid, intraretinal hyper-reflective material, subretinal hyper-reflective material (Interval improvement in IRF superior and temporal fovea).   Notes *Images captured and stored on drive  Diagnosis / Impression: Mild DME OU OD: nonexudative ARMD - stable drusen OS: Exudative ARMD - Interval improvement in IRF superior and temporal fovea  Clinical management:  See below  Abbreviations: NFP - Normal foveal profile. CME - cystoid macular edema. PED - pigment epithelial detachment. IRF - intraretinal fluid. SRF - subretinal fluid. EZ - ellipsoid zone. ERM - epiretinal membrane. ORA -  outer retinal atrophy. ORT - outer retinal tubulation. SRHM - subretinal hyper-reflective material       Intravitreal Injection, Pharmacologic Agent - OS - Left Eye       Time Out 12/01/2021. 1:13 PM. Confirmed correct patient, procedure, site, and patient consented.   Anesthesia Topical anesthesia was used. Anesthetic medications included Lidocaine 2%, Proparacaine 0.5%.   Procedure Preparation included 5% betadine to ocular surface, eyelid speculum. A (32g) needle was used.   Injection: 1.25 mg Bevacizumab 1.25mg /0.6ml   Route: Intravitreal, Site: Left Eye   NDC: H061816, LotHX:[id number], Expiration date: 12/30/2021, Waste: 0.05 mL   Post-op Post injection exam found visual acuity of at least counting fingers. The patient tolerated the procedure well. There were no complications. The patient received written and verbal post procedure care education.            ASSESSMENT/PLAN:   ICD-10-CM   1. Exudative age-related macular degeneration of left eye with active choroidal neovascularization (HCC)  H35.3221 OCT, Retina - OU - Both Eyes    Intravitreal Injection, Pharmacologic Agent - OS - Left Eye    Bevacizumab (AVASTIN) SOLN 1.25 mg    2. Intermediate stage nonexudative age-related macular degeneration of right eye  H35.3112     3. Both eyes affected by mild nonproliferative diabetic retinopathy with macular edema, associated with type 2 diabetes mellitus (No Name)  [id number]     4. Amblyopia of left eye  H53.002     5. Posterior vitreous detachment of left eye  H43.812     6. Pseudophakia of both eyes  Z96.1     7. Dry eyes  H04.123       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, OS   - interval conversion to exudative ARMD noted on 7.26.22             - s/p IVA OS #1 (07.26.22), #2 (  08.23.22), #3 (09.21.22), #4 (10.13.22), #5 (11.16.22), #6 (12.14.22)  - BCVA 20/350  - OCT shows interval improvement in IRF superior and temporal fovea  - recommend IVA OS  #7today, 01.11.23  - pt wishes to be treated with IVA - RBA of procedure discussed, questions answered - informed consent obtained and signed - see procedure note  - f/u in 4 wks -- DFE/OCT/likely inj.  2. Intermediate Age related macular degeneration, non-exudative, OD -- stable  - exam/OCT today shows OD: stable drusen  - suspect cystic changes mostly related to DM and HTN  - BCVA OD 20/25  - FA 01.04.21 - no CNVM OU -- no exudative disease  - continue amsler grid monitoring  3. Mild non-proliferative diabetic retinopathy, both eyes - exam shows rare MA/IRH - OCT shows mild diabetic macular edema/cystic changes, both eyes  - FA 01.04.21 - late leaking, perifoveal MA OU; no NV OU - monitor  4. Severe amblyopia OS  - history of patching as a child -- no strabismus surgery  - VA dropped down to 20/350 from 20/200 (baseline) due to conv to exu ARMD on 7.26.22  - now back to baseline -- 20/200 OS  5. PVD / vitreous syneresis OS  - Discussed findings and prognosis  - No RT or RD on 360 scleral depressed exam  - Reviewed s/s of RT/RD  - Strict return precautions for any such RT/RD signs/symptoms  6. Pseudophakia OU  - s/p CE/IOL OU by [name]  - beautiful surgeries, doing well  - monitor  7. Dry eyes OU - improving  - improved PEE on slit lamp  - likely main cause of chief complaint of burning upon waking  - cont artificial tears QID and lubricating ointment qhs as needed  Ophthalmic Meds Ordered this visit:  Meds ordered this encounter  Medications   Bevacizumab (AVASTIN) SOLN 1.25 mg     Return in about 4 weeks (around 12/29/2021) for f/u exu ARMD OS, DFE, OCT.  There are no Patient Instructions on file for this visit.  This document serves as a record of services personally performed by Gardiner Sleeper, MD, PhD. It was created on their behalf by Orvan Falconer, an ophthalmic technician. The creation of this record is the provider's dictation and/or activities during  the visit.    Electronically signed by: Orvan Falconer, OA, 12/02/21  11:04 PM  This document serves as a record of services personally performed by Gardiner Sleeper, MD, PhD. It was created on their behalf by San Jetty. Owens Shark, OA an ophthalmic technician. The creation of this record is the provider's dictation and/or activities during the visit.    Electronically signed by: San Jetty. Owens Shark, [place] 01.11.2023 11:04 PM  Gardiner Sleeper, M.D., Ph.D. Diseases & Surgery of the Retina and Vitreous [facility]  I have reviewed the above documentation for accuracy and completeness, and I agree with the above. Gardiner Sleeper, M.D., Ph.D. 12/02/21 11:10 PM   Abbreviations: M myopia (nearsighted); A astigmatism; H hyperopia (farsighted); P presbyopia; Mrx spectacle prescription;  CTL contact lenses; OD right eye; OS left eye; OU both eyes  XT exotropia; ET esotropia; PEK punctate epithelial keratitis; PEE punctate epithelial erosions; DES dry eye syndrome; MGD meibomian gland dysfunction; ATs artificial tears; PFAT's preservative free artificial tears; [place] nuclear sclerotic cataract; PSC posterior subcapsular cataract; ERM epi-retinal membrane; PVD posterior vitreous detachment; RD retinal detachment; DM diabetes mellitus; DR diabetic retinopathy; NPDR non-proliferative diabetic retinopathy; PDR proliferative diabetic retinopathy;  CSME clinically significant macular edema; DME diabetic macular edema; dbh dot blot hemorrhages; CWS cotton wool spot; POAG primary open angle glaucoma; C/D cup-to-disc ratio; HVF humphrey visual field; GVF goldmann visual field; OCT optical coherence tomography; IOP intraocular pressure; BRVO Branch retinal vein occlusion; CRVO central retinal vein occlusion; CRAO central retinal artery occlusion; BRAO branch retinal artery occlusion; RT retinal tear; SB scleral buckle; PPV pars plana vitrectomy; VH Vitreous hemorrhage; PRP panretinal laser photocoagulation; IVK  intravitreal kenalog; VMT vitreomacular traction; MH Macular hole;  NVD neovascularization of the disc; NVE neovascularization elsewhere; AREDS age related eye disease study; ARMD age related macular degeneration; POAG primary open angle glaucoma; EBMD epithelial/anterior basement membrane dystrophy; ACIOL anterior chamber intraocular lens; IOL intraocular lens; PCIOL posterior chamber intraocular lens; Phaco/IOL phacoemulsification with intraocular lens placement; [place] photorefractive keratectomy; LASIK laser assisted in situ keratomileusis; HTN hypertension; DM diabetes mellitus; COPD chronic obstructive pulmonary disease

## 2021-12-01 ENCOUNTER — Encounter (INDEPENDENT_AMBULATORY_CARE_PROVIDER_SITE_OTHER): Payer: Self-pay | Admitting: Ophthalmology

## 2021-12-01 ENCOUNTER — Ambulatory Visit (INDEPENDENT_AMBULATORY_CARE_PROVIDER_SITE_OTHER): Payer: Medicare HMO | Admitting: Ophthalmology

## 2021-12-01 ENCOUNTER — Other Ambulatory Visit: Payer: Self-pay

## 2021-12-01 DIAGNOSIS — H04123 Dry eye syndrome of bilateral lacrimal glands: Secondary | ICD-10-CM | POA: Diagnosis not present

## 2021-12-01 DIAGNOSIS — H53002 Unspecified amblyopia, left eye: Secondary | ICD-10-CM

## 2021-12-01 DIAGNOSIS — H43812 Vitreous degeneration, left eye: Secondary | ICD-10-CM | POA: Diagnosis not present

## 2021-12-01 DIAGNOSIS — E113213 Type 2 diabetes mellitus with mild nonproliferative diabetic retinopathy with macular edema, bilateral: Secondary | ICD-10-CM

## 2021-12-01 DIAGNOSIS — H353221 Exudative age-related macular degeneration, left eye, with active choroidal neovascularization: Secondary | ICD-10-CM | POA: Diagnosis not present

## 2021-12-01 DIAGNOSIS — Z961 Presence of intraocular lens: Secondary | ICD-10-CM | POA: Diagnosis not present

## 2021-12-01 DIAGNOSIS — H353112 Nonexudative age-related macular degeneration, right eye, intermediate dry stage: Secondary | ICD-10-CM | POA: Diagnosis not present

## 2021-12-02 ENCOUNTER — Encounter (INDEPENDENT_AMBULATORY_CARE_PROVIDER_SITE_OTHER): Payer: Self-pay | Admitting: Ophthalmology

## 2021-12-02 MED ORDER — BEVACIZUMAB CHEMO INJECTION 1.25MG/0.05ML SYRINGE FOR KALEIDOSCOPE
1.2500 mg | INTRAVITREAL | Status: AC | PRN
  Administered 2021-12-02: 1.25 mg via INTRAVITREAL

## 2021-12-28 NOTE — Progress Notes (Signed)
[facility] Note  12/29/2021     CHIEF COMPLAINT Patient presents for Retina Follow Up   HISTORY OF PRESENT ILLNESS: Monica Hamilton is a 81 y.o. female who presents to the clinic today for:   HPI     Retina Follow Up   Patient presents with  Wet AMD.  In left eye.  Severity is moderate.  Duration of 4 weeks.  Since onset it is stable.  I, the attending physician,  performed the HPI with the patient and updated documentation appropriately.        Comments   Pt here for 4 wk ret f/u exu ARMD OS. Pt states vision is stable, no changes since previous visit.       Last edited by [name], MD on 12/29/2021  1:46 PM.    Pt states no change in vision   Referring physician: Self-referral -- wife of Azelia Reiger  HISTORICAL INFORMATION:  Selected notes from the MEDICAL RECORD NUMBER Self referral Ocular Hx: pseudophakia OU [name] 2019) PMH: DM (on metformin), HTN, arthritis,    CURRENT MEDICATIONS: No current outpatient medications on file. (Ophthalmic Drugs)   No current facility-administered medications for this visit. (Ophthalmic Drugs)   Current Outpatient Medications (Other)  Medication Sig   amLODipine (NORVASC) 5 MG tablet Take 5 mg by mouth daily.   aspirin 81 MG EC tablet Take 81 mg by mouth daily.    CINNAMON PO Take 1,000 mg by mouth daily.   gabapentin (NEURONTIN) 100 MG capsule gabapentin 100 mg capsule  TAKE 1 CAPSULE BY MOUTH ONCE DAILY AT BEDTIME FOR 30 DAYS   gabapentin (NEURONTIN) 300 MG capsule Take 600 mg by mouth at bedtime.   metFORMIN (GLUCOPHAGE) 1000 MG tablet Take 1,000 mg by mouth 2 (two) times daily.    methocarbamol (ROBAXIN) 500 MG tablet Take 1 tablet (500 mg total) by mouth every 6 (six) hours as needed for muscle spasms.   metoprolol succinate (TOPROL-XL) 50 MG 24 hr tablet Take 50 mg by mouth daily. Take with or immediately following a meal.   Multiple Vitamins-Minerals (ICAPS AREDS 2) CAPS Take 1 capsule  by mouth 2 (two) times daily.    rosuvastatin (CRESTOR) 20 MG tablet Take by mouth.   valsartan-hydrochlorothiazide (DIOVAN-HCT) 160-25 MG tablet Take 1 tablet by mouth daily.   HYDROcodone-acetaminophen (NORCO) 7.5-325 MG tablet Take 1 tablet by mouth every 6 (six) hours as needed for severe pain ((score 7 to 10)). (Patient not taking: Reported on 12/29/2021)   MODERNA COVID-19 VACCINE 100 MCG/0.5ML injection  (Patient not taking: Reported on 12/29/2021)   No current facility-administered medications for this visit. (Other)   REVIEW OF SYSTEMS: ROS   Positive for: Musculoskeletal, Endocrine, Cardiovascular, Eyes Negative for: Constitutional, Gastrointestinal, Neurological, Skin, Genitourinary, HENT, Respiratory, Psychiatric, Allergic/Imm, Heme/Lymph Last edited by Kingsley Spittle, COT on 12/29/2021 12:51 PM.     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   Past Surgical History:  Procedure Laterality Date   ABDOMINAL HYSTERECTOMY     APPENDECTOMY     BLADDER SUSPENSION     BREAST BIOPSY Left    BREAST EXCISIONAL BIOPSY Left    CATARACT EXTRACTION     CHOLECYSTECTOMY     DILATION AND CURETTAGE OF UTERUS     EYE SURGERY     HEAD & NECK SKIN LESION EXCISIONAL BIOPSY     top left scalp   LUMBAR LAMINECTOMY/DECOMPRESSION MICRODISCECTOMY N/A 08/21/2019   Procedure: Lumbar decompression L3-4 right, microdiscectomy L3-4 right;  Surgeon: [name], MD;  Location: [facility];   Service: Orthopedics;  Laterality: N/A;  57mn   SHOULDER ARTHROSCOPY WITH SUBACROMIAL DECOMPRESSION Right 01/30/2013   Procedure: SHOULDER ARTHROSCOPY WITH SUBACROMIAL DECOMPRESSION;  Surgeon: JMagnus Sinning MD;  Location: [facility];  Service: Orthopedics;  Laterality: Right;  with labral debridement   FAMILY HISTORY Family History  Problem Relation Age of Onset   Heart disease Father    Breast cancer Maternal Aunt    Diabetes Brother    Leukemia Sister    Colon cancer Neg Hx    Esophageal cancer Neg Hx    Rectal cancer Neg Hx    Stomach cancer Neg Hx    SOCIAL HISTORY Social History   Tobacco Use   Smoking status: Never   Smokeless tobacco: Never  Vaping Use   Vaping Use: Never used  Substance Use Topics   Alcohol use: No   Drug use: No       OPHTHALMIC EXAM: Base Eye Exam     Visual Acuity (Snellen - Linear)       Right Left   Dist [place] 20/25 -2 20/200 -2   Dist ph [place] NI NI         Tonometry (Tonopen, 12:58 PM)       Right Left   Pressure 15 16         Pupils       Dark Light Shape React APD   Right 3 2 Round Brisk None   Left 3 2 Round Brisk None         Visual Fields (Counting fingers)       Left Right    Full Full         Extraocular Movement       Right Left    Full, Ortho Full, Ortho         Neuro/Psych     Oriented x3: Yes   Mood/Affect: Normal         Dilation     Both eyes: 1.0% Mydriacyl, 2.5% Phenylephrine @ 12:59 PM           Slit Lamp and Fundus Exam     Slit Lamp Exam       Right Left   Lids/Lashes Dermatochalasis - upper lid, mild Meibomian gland dysfunction Dermatochalasis - upper lid, Meibomian gland dysfunction   Conjunctiva/Sclera Nasal Pinguecula Nasal Pinguecula   Cornea 1+Punctate epithelial erosions, Arcus, Temporal Well healed cataract wounds, trace Debris in tear film 1+ Punctate epithelial erosions, Arcus, Temporal Well healed cataract wounds, trace Debris in tear film   Anterior Chamber  Deep and quiet Deep and quiet   Iris Round and moderately dilated Round and dilated   Lens PC IOL in good position, 1+ PCO PC IOL in good position, 1-2+Posterior capsular opacification   Anterior Vitreous Vitreous  syneresis, Posterior vitreous detachment, vitreous condensations Vitreous syneresis, Posterior vitreous detachment         Fundus Exam       Right Left   Disc Pink and Sharp Pink and Sharp, mild temporal Peripapillary atrophy, mild PPP   C/D Ratio 0.4 0.4   Macula Flat, Blunted foveal reflex, RPE mottling and clumping, mild atrophy, Drusen, rare MA/IRH, No edema Blunted foveal reflex, central cystic changes / edema - improved, +CNV now with pigment ring, no heme, +drusen, RPE mottling   Vessels attenuated, mild tortuousity attenuated, mild tortuousity   Periphery Attached, scattered mild Reticular degeneration.  No heme. Attached, scattered Reticular degeneration, No heme.           IMAGING AND PROCEDURES  Imaging and Procedures for _0 @  OCT, Retina - OU - Both Eyes       Right Eye Quality was good. Central Foveal Thickness: 286. Progression has been stable. Findings include normal foveal contour, no SRF, retinal drusen , no IRF, myopic contour.   Left Eye Quality was good. Central Foveal Thickness: 293. Progression has improved. Findings include abnormal foveal contour, retinal drusen , pigment epithelial detachment, intraretinal fluid, outer retinal atrophy, intraretinal hyper-reflective material, subretinal hyper-reflective material, no SRF (Interval improvement in IRF superior and temporal fovea, no SRF).   Notes *Images captured and stored on drive  Diagnosis / Impression: Mild DME OU OD: nonexudative ARMD - stable drusen OS: Exudative ARMD - Interval improvement in IRF superior and temporal fovea, no SRF  Clinical management:  See below  Abbreviations: NFP - Normal foveal profile. CME - cystoid macular edema. PED - pigment epithelial detachment. IRF -  intraretinal fluid. SRF - subretinal fluid. EZ - ellipsoid zone. ERM - epiretinal membrane. ORA - outer retinal atrophy. ORT - outer retinal tubulation. SRHM - subretinal hyper-reflective material       Intravitreal Injection, Pharmacologic Agent - OS - Left Eye       Time Out 12/29/2021. 1:11 PM. Confirmed correct patient, procedure, site, and patient consented.   Anesthesia Topical anesthesia was used. Anesthetic medications included Lidocaine 2%, Proparacaine 0.5%.   Procedure Preparation included 5% betadine to ocular surface, eyelid speculum. A supplied needle was used.   Injection: 1.25 mg Bevacizumab 1.55m/0.05ml   Route: Intravitreal, Site: Left Eye   NDC: [id number] Lot: [id number] , Expiration date: 02/02/2022   Post-op Post injection exam found visual acuity of at least counting fingers. The patient tolerated the procedure well. There were no complications. The patient received written and verbal post procedure care education.            ASSESSMENT/PLAN:   ICD-10-CM   1. Exudative age-related macular degeneration of left eye with active choroidal neovascularization (HCC)  H35.3221 OCT, Retina - OU - Both Eyes    Intravitreal Injection, Pharmacologic Agent - OS - Left Eye    Bevacizumab (AVASTIN) SOLN 1.25 mg    2. Intermediate stage nonexudative age-related macular degeneration of right eye  H35.3112     3. Both eyes affected by mild nonproliferative diabetic retinopathy with macular edema, associated with type 2 diabetes mellitus ([place]  EP54.6568    4. Amblyopia of left eye  H53.002     5. Posterior vitreous detachment of left eye  H43.812     6. Pseudophakia of both eyes  Z96.1     7. Dry eyes  H04.123      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, OS   - interval conversion to exudative ARMD noted on  7.26.22             - s/p IVA OS #1 (07.26.22), #2 (08.23.22), #3 (09.21.22), #4 (10.13.22), #5 (11.16.22), #6 (12.14.22), #7 (01.11.23)  - BCVA  improved to 20/200 from 20/350  - OCT shows interval improvement in IRF superior and temporal fovea, no SRF -- just mild cyst remains at 4 wks  - recommend IVA OS #8 today, 02.08.23 w/ ext to 5-6 wks  - pt wishes to be treated with IVA - RBA of procedure discussed, questions answered - informed consent obtained and signed - see procedure note  - f/u in 5-6 wks -- DFE/OCT/likely inj.  2. Intermediate Age related macular degeneration, non-exudative, OD -- stable  - exam/OCT today shows OD: stable drusen  - suspect cystic changes mostly related to DM and HTN  - BCVA OD 20/25  - FA 01.04.21 - no CNVM OU -- no exudative disease  - continue amsler grid monitoring  3. Mild non-proliferative diabetic retinopathy, both eyes - exam shows rare MA/IRH - OCT shows mild diabetic macular edema/cystic changes, both eyes  - FA 01.04.21 - late leaking, perifoveal MA OU; no NV OU - monitor  4. Severe amblyopia OS  - history of patching as a child -- no strabismus surgery  - VA dropped down to 20/350 from 20/200 (baseline) due to conv to exu ARMD on 7.26.22  - now back to baseline -- 20/200 OS  5. PVD / vitreous syneresis OS  - Discussed findings and prognosis  - No RT or RD on 360 scleral depressed exam  - Reviewed s/s of RT/RD  - Strict return precautions for any such RT/RD signs/symptoms  6. Pseudophakia OU  - s/p CE/IOL OU by [name]  - beautiful surgeries, doing well  - monitor  7. Dry eyes OU - improving  - improved PEE on slit lamp  - likely main cause of chief complaint of burning upon waking  - cont artificial tears QID and lubricating ointment qhs as needed  Ophthalmic Meds Ordered this visit:  Meds ordered this encounter  Medications   Bevacizumab (AVASTIN) SOLN 1.25 mg     Return for f/u 5-6 weeks, exu ARMD OS, DFE, OCT.  There are no Patient Instructions on file for this visit.  This document serves as a record of services personally performed by Gardiner Sleeper, MD,  PhD. It was created on their behalf by Orvan Falconer, an ophthalmic technician. The creation of this record is the provider's dictation and/or activities during the visit.    Electronically signed by: Orvan Falconer, OA, 12/29/21  1:48 PM  This document serves as a record of services personally performed by Gardiner Sleeper, MD, PhD. It was created on their behalf by San Jetty. Owens Shark, OA an ophthalmic technician. The creation of this record is the provider's dictation and/or activities during the visit.    Electronically signed by: San Jetty. Owens Shark, [place] 02.08.2023 1:48 PM  Gardiner Sleeper, M.D., Ph.D. Diseases & Surgery of the Retina and Vitreous [facility]  I have reviewed the above documentation for accuracy and completeness, and I agree with the above. Gardiner Sleeper, M.D., Ph.D. 12/29/21 1:48 PM   Abbreviations: M myopia (nearsighted); A astigmatism; H hyperopia (farsighted); P presbyopia; Mrx spectacle prescription;  CTL contact lenses; OD right eye; OS left eye; OU both eyes  XT exotropia; ET esotropia; PEK punctate epithelial keratitis; PEE punctate epithelial erosions; DES dry eye syndrome; MGD meibomian gland dysfunction; ATs artificial  tears; PFAT's preservative free artificial tears; Powell nuclear sclerotic cataract; PSC posterior subcapsular cataract; ERM epi-retinal membrane; PVD posterior vitreous detachment; RD retinal detachment; DM diabetes mellitus; DR diabetic retinopathy; NPDR non-proliferative diabetic retinopathy; PDR proliferative diabetic retinopathy; CSME clinically significant macular edema; DME diabetic macular edema; dbh dot blot hemorrhages; CWS cotton wool spot; POAG primary open angle glaucoma; C/D cup-to-disc ratio; HVF humphrey visual field; GVF goldmann visual field; OCT optical coherence tomography; IOP intraocular pressure; BRVO Branch retinal vein occlusion; CRVO central retinal vein occlusion; CRAO central retinal artery occlusion; BRAO  branch retinal artery occlusion; RT retinal tear; SB scleral buckle; PPV pars plana vitrectomy; VH Vitreous hemorrhage; PRP panretinal laser photocoagulation; IVK intravitreal kenalog; VMT vitreomacular traction; MH Macular hole;  NVD neovascularization of the disc; NVE neovascularization elsewhere; AREDS age related eye disease study; ARMD age related macular degeneration; POAG primary open angle glaucoma; EBMD epithelial/anterior basement membrane dystrophy; ACIOL anterior chamber intraocular lens; IOL intraocular lens; PCIOL posterior chamber intraocular lens; Phaco/IOL phacoemulsification with intraocular lens placement; Kennedy photorefractive keratectomy; LASIK laser assisted in situ keratomileusis; HTN hypertension; DM diabetes mellitus; COPD chronic obstructive pulmonary disease

## 2021-12-29 ENCOUNTER — Other Ambulatory Visit: Payer: Self-pay

## 2021-12-29 ENCOUNTER — Ambulatory Visit (INDEPENDENT_AMBULATORY_CARE_PROVIDER_SITE_OTHER): Payer: Medicare HMO | Admitting: Ophthalmology

## 2021-12-29 ENCOUNTER — Encounter (INDEPENDENT_AMBULATORY_CARE_PROVIDER_SITE_OTHER): Payer: Self-pay | Admitting: Ophthalmology

## 2021-12-29 DIAGNOSIS — H04123 Dry eye syndrome of bilateral lacrimal glands: Secondary | ICD-10-CM

## 2021-12-29 DIAGNOSIS — H353221 Exudative age-related macular degeneration, left eye, with active choroidal neovascularization: Secondary | ICD-10-CM | POA: Diagnosis not present

## 2021-12-29 DIAGNOSIS — E113213 Type 2 diabetes mellitus with mild nonproliferative diabetic retinopathy with macular edema, bilateral: Secondary | ICD-10-CM

## 2021-12-29 DIAGNOSIS — H43812 Vitreous degeneration, left eye: Secondary | ICD-10-CM | POA: Diagnosis not present

## 2021-12-29 DIAGNOSIS — H53002 Unspecified amblyopia, left eye: Secondary | ICD-10-CM

## 2021-12-29 DIAGNOSIS — H353112 Nonexudative age-related macular degeneration, right eye, intermediate dry stage: Secondary | ICD-10-CM | POA: Diagnosis not present

## 2021-12-29 DIAGNOSIS — Z961 Presence of intraocular lens: Secondary | ICD-10-CM

## 2021-12-29 MED ORDER — BEVACIZUMAB CHEMO INJECTION 1.25MG/0.05ML SYRINGE FOR KALEIDOSCOPE
1.2500 mg | INTRAVITREAL | Status: AC | PRN
  Administered 2021-12-29: 1.25 mg via INTRAVITREAL

## 2022-01-31 DIAGNOSIS — R7989 Other specified abnormal findings of blood chemistry: Secondary | ICD-10-CM | POA: Diagnosis not present

## 2022-01-31 DIAGNOSIS — E1129 Type 2 diabetes mellitus with other diabetic kidney complication: Secondary | ICD-10-CM | POA: Diagnosis not present

## 2022-02-02 DIAGNOSIS — Z1212 Encounter for screening for malignant neoplasm of rectum: Secondary | ICD-10-CM | POA: Diagnosis not present

## 2022-02-03 DIAGNOSIS — R82998 Other abnormal findings in urine: Secondary | ICD-10-CM | POA: Diagnosis not present

## 2022-02-04 NOTE — Progress Notes (Signed)
?[facility] Note ? ?02/09/2022 ? ?  ? ?CHIEF COMPLAINT ?Patient presents for Retina Follow Up ? ? ?HISTORY OF PRESENT ILLNESS: ?Monica Hamilton is a 81 y.o. female who presents to the clinic today for:  ? ?HPI   ? ? 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  This started 6 weeks ago.  I, the attending physician,  performed the HPI with the patient and updated documentation appropriately. ? ?  ?  ? ? Comments   ?Patient here for 6 weeks retina follow up for exu ARMD OS. Patient states vision good. No eye pain. ? ?  ?  ?Last edited by Rennis Chris, MD on 02/13/2022  8:44 PM.  ?  ? ? ?Referring physician: ?Self-referral -- wife of Teara Duerksen ? ?HISTORICAL INFORMATION:  ?Selected notes from the MEDICAL RECORD NUMBER ?Self referral ?Ocular Hx: pseudophakia OU [name] 2019) ?PMH: DM (on metformin), HTN, arthritis,   ? ?CURRENT MEDICATIONS: ?No current outpatient medications on file. (Ophthalmic Drugs)  ? ?No current facility-administered medications for this visit. (Ophthalmic Drugs)  ? ?Current Outpatient Medications (Other)  ?Medication Sig  ? amLODipine (NORVASC) 5 MG tablet Take 5 mg by mouth daily.  ? aspirin 81 MG EC tablet Take 81 mg by mouth daily.   ? CINNAMON PO Take 1,000 mg by mouth daily.  ? gabapentin (NEURONTIN) 100 MG capsule gabapentin 100 mg capsule ? TAKE 1 CAPSULE BY MOUTH ONCE DAILY AT BEDTIME FOR 30 DAYS  ? gabapentin (NEURONTIN) 300 MG capsule Take 600 mg by mouth at bedtime.  ? HYDROcodone-acetaminophen (NORCO) 7.5-325 MG tablet Take 1 tablet by mouth every 6 (six) hours as needed for severe pain ((score 7 to 10)). (Patient not taking: Reported on 12/29/2021)  ? metFORMIN (GLUCOPHAGE) 1000 MG tablet Take 1,000 mg by mouth 2 (two) times daily.   ? methocarbamol (ROBAXIN) 500 MG tablet Take 1 tablet (500 mg total) by mouth every 6 (six) hours as needed for muscle spasms.  ? metoprolol succinate (TOPROL-XL) 50 MG 24 hr tablet Take 50 mg by mouth  daily. Take with or immediately following a meal.  ? MODERNA COVID-19 VACCINE 100 MCG/0.5ML injection  (Patient not taking: Reported on 12/29/2021)  ? Multiple Vitamins-Minerals (ICAPS AREDS 2) CAPS Take 1 capsule by mouth 2 (two) times daily.   ? rosuvastatin (CRESTOR) 20 MG tablet Take by mouth.  ? valsartan-hydrochlorothiazide (DIOVAN-HCT) 160-25 MG tablet Take 1 tablet by mouth daily.  ? ?No current facility-administered medications for this visit. (Other)  ? ?REVIEW OF SYSTEMS: ?ROS   ?Positive for: Musculoskeletal, Endocrine, Cardiovascular, Eyes ?Negative for: Constitutional, Gastrointestinal, Neurological, Skin, Genitourinary, HENT, Respiratory, Psychiatric, Allergic/Imm, Heme/Lymph ?Last edited by Laddie Aquas, COA on 02/09/2022  1:05 PM.  ?  ? ?ALLERGIES ?Allergies  ?Allergen Reactions  ? Ace Inhibitors Cough  ? ?PAST MEDICAL HISTORY ?Past Medical History:  ?Diagnosis Date  ? Amblyopia   ? Arthritis   ? Colon polyp   ? adenomarous  ? Diabetes mellitus without complication (HCC)   ? Diabetic retinopathy (HCC)   ? Diverticulosis   ? Hepatomegaly   ? Hyperlipidemia   ? Hypertension   ? Left ventricular hypertrophy   ? Macular degeneration of right eye   ? Mitral valve prolapse   ? Perforated ulcer (HCC) 2011  ? Pneumonia   ? 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 Age 89, led to Mitral valve prolapse  ? ?Past  Surgical History:  ?Procedure Laterality Date  ? ABDOMINAL HYSTERECTOMY    ? APPENDECTOMY    ? BLADDER SUSPENSION    ? BREAST BIOPSY Left   ? BREAST EXCISIONAL BIOPSY Left   ? CATARACT EXTRACTION    ? CHOLECYSTECTOMY    ? DILATION AND CURETTAGE OF UTERUS    ? EYE SURGERY    ? HEAD & NECK SKIN LESION EXCISIONAL BIOPSY    ? top left scalp  ? LUMBAR LAMINECTOMY/DECOMPRESSION MICRODISCECTOMY N/A 08/21/2019  ? Procedure: Lumbar decompression L3-4 right, microdiscectomy L3-4 right;  Surgeon: Ranee Gosselin, MD;  Location: [facility];  Service: Orthopedics;  Laterality: N/A;   ? SHOULDER ARTHROSCOPY WITH  SUBACROMIAL DECOMPRESSION Right 01/30/2013  ? Procedure: SHOULDER ARTHROSCOPY WITH SUBACROMIAL DECOMPRESSION;  Surgeon: Drucilla Schmidt, MD;  Location: [facility];  Service: Orthopedics;  Laterality: Right;  with labral debridement  ? ?FAMILY HISTORY ?Family History  ?Problem Relation Age of Onset  ? Heart disease Father   ? Breast cancer Maternal Aunt   ? Diabetes Brother   ? Leukemia Sister   ? Colon cancer Neg Hx   ? Esophageal cancer Neg Hx   ? Rectal cancer Neg Hx   ? Stomach cancer Neg Hx   ? ?SOCIAL HISTORY ?Social History  ? ?Tobacco Use  ? Smoking status: Never  ? Smokeless tobacco: Never  ?Vaping Use  ? Vaping Use: Never used  ?Substance Use Topics  ? Alcohol use: No  ? Drug use: No  ?  ? ?  ?OPHTHALMIC EXAM: ?Base Eye Exam   ? ? Visual Acuity (Snellen - Linear)   ? ?   Right Left  ? Dist [place] 20/30 -1 20/300 -1  ? Dist ph [place] 20/25 -2 20/250 -2  ? ?  ?  ? ? Tonometry (Tonopen, 1:03 PM)   ? ?   Right Left  ? Pressure 21 22  ? ?  ?  ? ? Pupils   ? ?   Dark Light Shape React APD  ? Right 3 2 Round Brisk None  ? Left 3 2 Round Brisk None  ? ?  ?  ? ? Visual Fields (Counting fingers)   ? ?   Left Right  ?  Full Full  ? ?  ?  ? ? Extraocular Movement   ? ?   Right Left  ?  Full, Ortho Full, Ortho  ? ?  ?  ? ? Neuro/Psych   ? ? Oriented x3: Yes  ? Mood/Affect: Normal  ? ?  ?  ? ? Dilation   ? ? Both eyes: 1.0% Mydriacyl, 2.5% Phenylephrine @ 1:03 PM  ? ?  ?  ? ?  ? ?Slit Lamp and Fundus Exam   ? ? Slit Lamp Exam   ? ?   Right Left  ? Lids/Lashes Dermatochalasis - upper lid, mild Meibomian gland dysfunction Dermatochalasis - upper lid, Meibomian gland dysfunction  ? Conjunctiva/Sclera Nasal Pinguecula Nasal Pinguecula  ? [place] 1+Punctate epithelial erosions, Arcus, Temporal Well healed cataract wounds, trace Debris in tear film 1+ Punctate epithelial erosions, Arcus, Temporal Well healed cataract wounds, trace Debris in tear film  ? Anterior Chamber Deep and quiet Deep and quiet  ? Iris Round and moderately dilated  Round and dilated  ? Lens PC IOL in good position, 1+ PCO PC IOL in good position, 1-2+Posterior capsular opacification  ? Anterior Vitreous Vitreous syneresis, Posterior vitreous detachment, vitreous condensations Vitreous syneresis, Posterior vitreous detachment  ? ?  ?  ? ?  Fundus Exam   ? ?   Right Left  ? Disc Pink and Sharp Pink and Sharp, mild temporal Peripapillary atrophy, mild PPP  ? C/D Ratio 0.4 0.4  ? Macula Flat, Blunted foveal reflex, RPE mottling and clumping, mild atrophy, Drusen, rare MA/IRH, No edema Blunted foveal reflex, central cystic changes / edema - improved, +CNV now with pigment ring, no heme, +drusen, RPE mottling  ? Vessels attenuated, mild tortuousity attenuated, mild tortuousity  ? Periphery Attached, scattered mild Reticular degeneration.  No heme. Attached, scattered Reticular degeneration, No heme.  ? ?  ?  ? ?  ? ?IMAGING AND PROCEDURES  ?Imaging and Procedures for @ ? ?OCT, Retina - OU - Both Eyes   ? ?   ?Right Eye ?Quality was good. Central Foveal Thickness: 283. Progression has been stable. Findings include normal foveal contour, no SRF, retinal drusen , no IRF, myopic contour.  ? ?Left Eye ?Quality was good. Central Foveal Thickness: 293. Progression has improved. Findings include abnormal foveal contour, retinal drusen , pigment epithelial detachment, intraretinal fluid, outer retinal atrophy, intraretinal hyper-reflective material, subretinal hyper-reflective material, no SRF (Mild Interval improvement in cystic changes superior and temporal fovea, no SRF).  ? ?Notes ?*Images captured and stored on drive ? ?Diagnosis / Impression: ?Mild DME OU ?OD: nonexudative ARMD - stable drusen ?OS: Exudative ARMD - Mild Interval improvement in cystic changes superior and temporal fovea, no SRF ? ?Clinical management:  ?See below ? ?Abbreviations: NFP - Normal foveal profile. CME - cystoid macular edema. PED - pigment epithelial detachment. IRF - intraretinal fluid. SRF -  subretinal fluid. EZ - ellipsoid zone. ERM - epiretinal membrane. ORA - outer retinal atrophy. ORT - outer retinal tubulation. SRHM - subretinal hyper-reflective material ? ? ? ?  ? ?Intravitreal Injection, Pharmac

## 2022-02-09 ENCOUNTER — Ambulatory Visit (INDEPENDENT_AMBULATORY_CARE_PROVIDER_SITE_OTHER): Payer: Medicare HMO | Admitting: Ophthalmology

## 2022-02-09 ENCOUNTER — Other Ambulatory Visit: Payer: Self-pay

## 2022-02-09 ENCOUNTER — Encounter (INDEPENDENT_AMBULATORY_CARE_PROVIDER_SITE_OTHER): Payer: Self-pay | Admitting: Ophthalmology

## 2022-02-09 DIAGNOSIS — H353112 Nonexudative age-related macular degeneration, right eye, intermediate dry stage: Secondary | ICD-10-CM | POA: Diagnosis not present

## 2022-02-09 DIAGNOSIS — H43812 Vitreous degeneration, left eye: Secondary | ICD-10-CM | POA: Diagnosis not present

## 2022-02-09 DIAGNOSIS — H53002 Unspecified amblyopia, left eye: Secondary | ICD-10-CM | POA: Diagnosis not present

## 2022-02-09 DIAGNOSIS — E113213 Type 2 diabetes mellitus with mild nonproliferative diabetic retinopathy with macular edema, bilateral: Secondary | ICD-10-CM

## 2022-02-09 DIAGNOSIS — H353221 Exudative age-related macular degeneration, left eye, with active choroidal neovascularization: Secondary | ICD-10-CM | POA: Diagnosis not present

## 2022-02-09 DIAGNOSIS — Z961 Presence of intraocular lens: Secondary | ICD-10-CM

## 2022-02-09 DIAGNOSIS — H04123 Dry eye syndrome of bilateral lacrimal glands: Secondary | ICD-10-CM | POA: Diagnosis not present

## 2022-02-13 ENCOUNTER — Encounter (INDEPENDENT_AMBULATORY_CARE_PROVIDER_SITE_OTHER): Payer: Self-pay | Admitting: Ophthalmology

## 2022-02-13 MED ORDER — BEVACIZUMAB CHEMO INJECTION 1.25MG/0.05ML SYRINGE FOR KALEIDOSCOPE
1.2500 mg | INTRAVITREAL | Status: AC | PRN
  Administered 2022-02-13: 1.25 mg via INTRAVITREAL

## 2022-02-14 ENCOUNTER — Encounter (INDEPENDENT_AMBULATORY_CARE_PROVIDER_SITE_OTHER): Payer: Medicare HMO | Admitting: Ophthalmology

## 2022-02-21 DIAGNOSIS — E785 Hyperlipidemia, unspecified: Secondary | ICD-10-CM | POA: Diagnosis not present

## 2022-02-21 DIAGNOSIS — I700 Atherosclerosis of aorta: Secondary | ICD-10-CM | POA: Diagnosis not present

## 2022-02-21 DIAGNOSIS — I129 Hypertensive chronic kidney disease with stage 1 through stage 4 chronic kidney disease, or unspecified chronic kidney disease: Secondary | ICD-10-CM | POA: Diagnosis not present

## 2022-02-21 DIAGNOSIS — M40299 Other kyphosis, site unspecified: Secondary | ICD-10-CM | POA: Diagnosis not present

## 2022-02-21 DIAGNOSIS — M5136 Other intervertebral disc degeneration, lumbar region: Secondary | ICD-10-CM | POA: Diagnosis not present

## 2022-02-21 DIAGNOSIS — N182 Chronic kidney disease, stage 2 (mild): Secondary | ICD-10-CM | POA: Diagnosis not present

## 2022-02-21 DIAGNOSIS — H3530 Unspecified macular degeneration: Secondary | ICD-10-CM | POA: Diagnosis not present

## 2022-02-21 DIAGNOSIS — E1129 Type 2 diabetes mellitus with other diabetic kidney complication: Secondary | ICD-10-CM | POA: Diagnosis not present

## 2022-02-21 DIAGNOSIS — Z0000 Encounter for general adult medical examination without abnormal findings: Secondary | ICD-10-CM | POA: Diagnosis not present

## 2022-02-21 DIAGNOSIS — Z1339 Encounter for screening examination for other mental health and behavioral disorders: Secondary | ICD-10-CM | POA: Diagnosis not present

## 2022-02-21 DIAGNOSIS — Z1331 Encounter for screening for depression: Secondary | ICD-10-CM | POA: Diagnosis not present

## 2022-03-14 NOTE — Progress Notes (Addendum)
?[facility] Note ? ?03/16/2022 ? ?  ? ?CHIEF COMPLAINT ?Patient presents for Retina Follow Up ? ? ?HISTORY OF PRESENT ILLNESS: ?Monica Hamilton is a 81 y.o. female who presents to the clinic today for:  ? ?HPI   ? ? 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  Severity is moderate.  Duration of 5 weeks.  Since onset it is stable.  I, the attending physician,  performed the HPI with the patient and updated documentation appropriately. ? ?  ?  ? ? Comments   ?Pt here for 5 wk ret f/u for exu ARMD OS. Pt states VA the same, no change.  ? ?  ?  ?Last edited by [name], MD on 03/16/2022 11:43 PM.  ?  ? ?Referring physician: ?Self-referral -- wife of Adelis Docter ? ?HISTORICAL INFORMATION:  ?Selected notes from the [facility] ?Self referral ?Ocular Hx: pseudophakia OU [name] 2019) ?PMH: DM (on metformin), HTN, arthritis,   ? ?CURRENT MEDICATIONS: ?No current outpatient medications on file. (Ophthalmic Drugs)  ? ?No current facility-administered medications for this visit. (Ophthalmic Drugs)  ? ?Current Outpatient Medications (Other)  ?Medication Sig  ? amLODipine (NORVASC) 5 MG tablet Take 5 mg by mouth daily.  ? aspirin 81 MG EC tablet Take 81 mg by mouth daily.   ? CINNAMON PO Take 1,000 mg by mouth daily.  ? gabapentin (NEURONTIN) 100 MG capsule gabapentin 100 mg capsule ? TAKE 1 CAPSULE BY MOUTH ONCE DAILY AT BEDTIME FOR 30 DAYS  ? gabapentin (NEURONTIN) 300 MG capsule Take 600 mg by mouth at bedtime.  ? HYDROcodone-acetaminophen (NORCO) 7.5-325 MG tablet Take 1 tablet by mouth every 6 (six) hours as needed for severe pain ((score 7 to 10)).  ? metFORMIN (GLUCOPHAGE) 1000 MG tablet Take 1,000 mg by mouth 2 (two) times daily.   ? methocarbamol (ROBAXIN) 500 MG tablet Take 1 tablet (500 mg total) by mouth every 6 (six) hours as needed for muscle spasms.  ? metoprolol succinate (TOPROL-XL) 50 MG 24 hr tablet Take 50 mg by mouth daily. Take with or  immediately following a meal.  ? Multiple Vitamins-Minerals (ICAPS AREDS 2) CAPS Take 1 capsule by mouth 2 (two) times daily.   ? rosuvastatin (CRESTOR) 20 MG tablet Take by mouth.  ? valsartan-hydrochlorothiazide (DIOVAN-HCT) 160-25 MG tablet Take 1 tablet by mouth daily.  ? MODERNA COVID-19 VACCINE 100 MCG/0.5ML injection  (Patient not taking: Reported on 12/29/2021)  ? ?No current facility-administered medications for this visit. (Other)  ? ?REVIEW OF SYSTEMS: ?ROS   ?Positive for: Musculoskeletal, Endocrine, Cardiovascular, Eyes ?Negative for: Constitutional, Gastrointestinal, Neurological, Skin, Genitourinary, HENT, Respiratory, Psychiatric, Allergic/Imm, Heme/Lymph ?Last edited by Kingsley Spittle, COT on 03/16/2022  1:16 PM.  ?  ? ? ?ALLERGIES ?Allergies  ?Allergen Reactions  ? Ace Inhibitors Cough  ? ?PAST MEDICAL HISTORY ?Past Medical History:  ?Diagnosis Date  ? Amblyopia   ? Arthritis   ? Colon polyp   ? adenomarous  ? Diabetes mellitus without complication ([place])   ? Diabetic retinopathy ([place])   ? Diverticulosis   ? Hepatomegaly   ? Hyperlipidemia   ? Hypertension   ? Left ventricular hypertrophy   ? Macular degeneration of right eye   ? Mitral valve prolapse   ? Perforated ulcer ([place]) 2011  ? Pneumonia   ? 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 Age 70, led to Mitral valve prolapse  ? ?  Past Surgical History:  ?Procedure Laterality Date  ? ABDOMINAL HYSTERECTOMY    ? APPENDECTOMY    ? BLADDER SUSPENSION    ? BREAST BIOPSY Left   ? BREAST EXCISIONAL BIOPSY Left   ? CATARACT EXTRACTION    ? CHOLECYSTECTOMY    ? DILATION AND CURETTAGE OF UTERUS    ? EYE SURGERY    ? HEAD & NECK SKIN LESION EXCISIONAL BIOPSY    ? top left scalp  ? LUMBAR LAMINECTOMY/DECOMPRESSION MICRODISCECTOMY N/A 08/21/2019  ? Procedure: Lumbar decompression L3-4 right, microdiscectomy L3-4 right;  Surgeon: [name], MD;  Location: [facility];  Service: Orthopedics;  Laterality: N/A;  44mn  ? SHOULDER ARTHROSCOPY WITH SUBACROMIAL  DECOMPRESSION Right 01/30/2013  ? Procedure: SHOULDER ARTHROSCOPY WITH SUBACROMIAL DECOMPRESSION;  Surgeon: JMagnus Sinning MD;  Location: [facility];  Service: Orthopedics;  Laterality: Right;  with labral debridement  ? ?FAMILY HISTORY ?Family History  ?Problem Relation Age of Onset  ? Heart disease Father   ? Breast cancer Maternal Aunt   ? Diabetes Brother   ? Leukemia Sister   ? Colon cancer Neg Hx   ? Esophageal cancer Neg Hx   ? Rectal cancer Neg Hx   ? Stomach cancer Neg Hx   ? ?SOCIAL HISTORY ?Social History  ? ?Tobacco Use  ? Smoking status: Never  ? Smokeless tobacco: Never  ?Vaping Use  ? Vaping Use: Never used  ?Substance Use Topics  ? Alcohol use: No  ? Drug use: No  ?  ? ?  ?OPHTHALMIC EXAM: ?Base Eye Exam   ? ? Visual Acuity (Snellen - Linear)   ? ?   Right Left  ? Dist [place] 20/30 -1 20/250-1  ? Dist ph [place] 20/25 -1 NI  ? ?  ?  ? ? Tonometry (Tonopen, 1:22 PM)   ? ?   Right Left  ? Pressure 16 19  ? ?  ?  ? ? Pupils   ? ?   Pupils Dark Light Shape React APD  ? Right PERRL 3 2 Round Brisk None  ? Left PERRL 3 2 Round Brisk None  ? ?  ?  ? ? Visual Fields (Counting fingers)   ? ?   Left Right  ?  Full Full  ? ?  ?  ? ? Extraocular Movement   ? ?   Right Left  ?  Full, Ortho Full, Ortho  ? ?  ?  ? ? Neuro/Psych   ? ? Oriented x3: Yes  ? Mood/Affect: Normal  ? ?  ?  ? ? Dilation   ? ? Both eyes: 1.0% Mydriacyl, 2.5% Phenylephrine @ 1:23 PM  ? ?  ?  ? ?  ? ?Slit Lamp and Fundus Exam   ? ? Slit Lamp Exam   ? ?   Right Left  ? Lids/Lashes Dermatochalasis - upper lid, mild Meibomian gland dysfunction Dermatochalasis - upper lid, Meibomian gland dysfunction  ? Conjunctiva/Sclera Nasal Pinguecula Nasal Pinguecula  ? [place] 1+Punctate epithelial erosions, Arcus, Temporal Well healed cataract wounds, trace Debris in tear film 1+ Punctate epithelial erosions, Arcus, Temporal Well healed cataract wounds, trace Debris in tear film  ? Anterior Chamber Deep and quiet Deep and quiet  ? Iris Round and moderately dilated  Round and dilated  ? Lens PC IOL in good position, 1+ PCO PC IOL in good position, 1-2+Posterior capsular opacification  ? Anterior Vitreous Vitreous syneresis, Posterior vitreous detachment, vitreous condensations Vitreous syneresis, Posterior vitreous detachment  ? ?  ?  ? ?  Fundus Exam   ? ?   Right Left  ? Disc Pink and Sharp Pink and Sharp, mild temporal Peripapillary atrophy, mild PPP  ? C/D Ratio 0.4 0.4  ? Macula Flat, Blunted foveal reflex, RPE mottling and clumping, mild atrophy, Drusen, rare MA, No edema Blunted foveal reflex, central cystic changes / edema - improved, +CNV now with pigment ring, no heme, +drusen, RPE mottling  ? Vessels attenuated, mild tortuousity attenuated, mild tortuousity  ? Periphery Attached, scattered mild Reticular degeneration.  No heme. Attached, scattered Reticular degeneration, No heme.  ? ?  ?  ? ?  ? ?IMAGING AND PROCEDURES  ?Imaging and Procedures for @TODAY @ ? ?OCT, Retina - OU - Both Eyes   ? ?   ?Right Eye ?Quality was good. Central Foveal Thickness: 285. Progression has been stable. Findings include normal foveal contour, no SRF, retinal drusen , no IRF, myopic contour.  ? ?Left Eye ?Quality was good. Central Foveal Thickness: 284. Progression has improved. Findings include abnormal foveal contour, retinal drusen , pigment epithelial detachment, intraretinal fluid, outer retinal atrophy, intraretinal hyper-reflective material, subretinal hyper-reflective material, no SRF (Mild Interval improvement in cystic changes superior and temporal fovea, no SRF).  ? ?Notes ?*Images captured and stored on drive ? ?Diagnosis / Impression: ?Mild DME OU ?OD: nonexudative ARMD - stable drusen ?OS: Exudative ARMD - Mild Interval improvement in cystic changes superior and temporal fovea, no SRF ? ?Clinical management:  ?See below ? ?Abbreviations: NFP - Normal foveal profile. CME - cystoid macular edema. PED - pigment epithelial detachment. IRF - intraretinal fluid. SRF - subretinal  fluid. EZ - ellipsoid zone. ERM - epiretinal membrane. ORA - outer retinal atrophy. ORT - outer retinal tubulation. SRHM - subretinal hyper-reflective material ? ? ? ?  ? ?Intravitreal Injection, Pharmacologic Agent

## 2022-03-16 ENCOUNTER — Ambulatory Visit (INDEPENDENT_AMBULATORY_CARE_PROVIDER_SITE_OTHER): Payer: Medicare HMO | Admitting: Ophthalmology

## 2022-03-16 ENCOUNTER — Encounter (INDEPENDENT_AMBULATORY_CARE_PROVIDER_SITE_OTHER): Payer: Self-pay | Admitting: Ophthalmology

## 2022-03-16 DIAGNOSIS — Z961 Presence of intraocular lens: Secondary | ICD-10-CM | POA: Diagnosis not present

## 2022-03-16 DIAGNOSIS — H43812 Vitreous degeneration, left eye: Secondary | ICD-10-CM

## 2022-03-16 DIAGNOSIS — E113213 Type 2 diabetes mellitus with mild nonproliferative diabetic retinopathy with macular edema, bilateral: Secondary | ICD-10-CM | POA: Diagnosis not present

## 2022-03-16 DIAGNOSIS — H353221 Exudative age-related macular degeneration, left eye, with active choroidal neovascularization: Secondary | ICD-10-CM

## 2022-03-16 DIAGNOSIS — H53002 Unspecified amblyopia, left eye: Secondary | ICD-10-CM

## 2022-03-16 DIAGNOSIS — H353112 Nonexudative age-related macular degeneration, right eye, intermediate dry stage: Secondary | ICD-10-CM

## 2022-03-17 MED ORDER — BEVACIZUMAB CHEMO INJECTION 1.25MG/0.05ML SYRINGE FOR KALEIDOSCOPE
1.2500 mg | INTRAVITREAL | Status: AC | PRN
  Administered 2022-03-17: 1.25 mg via INTRAVITREAL

## 2022-03-28 DIAGNOSIS — M5136 Other intervertebral disc degeneration, lumbar region: Secondary | ICD-10-CM | POA: Diagnosis not present

## 2022-03-28 DIAGNOSIS — M5137 Other intervertebral disc degeneration, lumbosacral region: Secondary | ICD-10-CM | POA: Diagnosis not present

## 2022-03-28 DIAGNOSIS — M5441 Lumbago with sciatica, right side: Secondary | ICD-10-CM | POA: Diagnosis not present

## 2022-03-31 ENCOUNTER — Other Ambulatory Visit: Payer: Self-pay | Admitting: Neurosurgery

## 2022-03-31 ENCOUNTER — Other Ambulatory Visit (HOSPITAL_COMMUNITY): Payer: Self-pay | Admitting: Neurosurgery

## 2022-03-31 DIAGNOSIS — M51369 Other intervertebral disc degeneration, lumbar region without mention of lumbar back pain or lower extremity pain: Secondary | ICD-10-CM

## 2022-03-31 DIAGNOSIS — M5136 Other intervertebral disc degeneration, lumbar region: Secondary | ICD-10-CM

## 2022-04-09 ENCOUNTER — Ambulatory Visit (HOSPITAL_COMMUNITY)
Admission: RE | Admit: 2022-04-09 | Discharge: 2022-04-09 | Disposition: A | Discharge status: Home / Self Care | Payer: Medicare HMO | Source: Ambulatory Visit | Attending: Neurosurgery | Admitting: Neurosurgery

## 2022-04-09 DIAGNOSIS — M5136 Other intervertebral disc degeneration, lumbar region: Secondary | ICD-10-CM | POA: Insufficient documentation

## 2022-04-09 DIAGNOSIS — M48061 Spinal stenosis, lumbar region without neurogenic claudication: Secondary | ICD-10-CM | POA: Diagnosis not present

## 2022-04-09 DIAGNOSIS — M5126 Other intervertebral disc displacement, lumbar region: Secondary | ICD-10-CM | POA: Diagnosis not present

## 2022-04-09 DIAGNOSIS — M47816 Spondylosis without myelopathy or radiculopathy, lumbar region: Secondary | ICD-10-CM | POA: Diagnosis not present

## 2022-04-09 DIAGNOSIS — M51369 Other intervertebral disc degeneration, lumbar region without mention of lumbar back pain or lower extremity pain: Secondary | ICD-10-CM

## 2022-04-09 MED ORDER — GADOBUTROL 1 MMOL/ML IV SOLN
6.0000 mL | Freq: Once | INTRAVENOUS | Status: AC | PRN
  Administered 2022-04-09: 6 mL via INTRAVENOUS

## 2022-04-11 DIAGNOSIS — M5136 Other intervertebral disc degeneration, lumbar region: Secondary | ICD-10-CM | POA: Diagnosis not present

## 2022-04-25 NOTE — Progress Notes (Signed)
[facility] Note  04/27/2022     CHIEF COMPLAINT Patient presents for Retina Follow Up   HISTORY OF PRESENT ILLNESS: Monica Hamilton is a 81 y.o. female who presents to the clinic today for:   HPI     Retina Follow Up   Patient presents with  Wet AMD.  In left eye.  This started 6 weeks ago.  I, the attending physician,  performed the HPI with the patient and updated documentation appropriately.        Comments   Patient here for 6 weeks retina follow up for exu ARMD OS. Patient states vision no difference. No eye pain. Was put back on gabapentin every other day.       Last edited by [name], MD on 04/27/2022  4:02 PM.      Referring physician: Self-referral -- wife of Brock Ra  HISTORICAL INFORMATION:  Selected notes from the MEDICAL RECORD NUMBER Self referral Ocular Hx: pseudophakia OU [name] 2019) PMH: DM (on metformin), HTN, arthritis,    CURRENT MEDICATIONS: No current outpatient medications on file. (Ophthalmic Drugs)   No current facility-administered medications for this visit. (Ophthalmic Drugs)   Current Outpatient Medications (Other)  Medication Sig   amLODipine (NORVASC) 5 MG tablet Take 5 mg by mouth daily.   aspirin 81 MG EC tablet Take 81 mg by mouth daily.    CINNAMON PO Take 1,000 mg by mouth daily.   gabapentin (NEURONTIN) 100 MG capsule gabapentin 100 mg capsule  TAKE 1 CAPSULE BY MOUTH ONCE DAILY AT BEDTIME FOR 30 DAYS   gabapentin (NEURONTIN) 300 MG capsule Take 600 mg by mouth at bedtime.   HYDROcodone-acetaminophen (NORCO) 7.5-325 MG tablet Take 1 tablet by mouth every 6 (six) hours as needed for severe pain ((score 7 to 10)).   metFORMIN (GLUCOPHAGE) 1000 MG tablet Take 1,000 mg by mouth 2 (two) times daily.    methocarbamol (ROBAXIN) 500 MG tablet Take 1 tablet (500 mg total) by mouth every 6 (six) hours as needed for muscle spasms.   metoprolol succinate (TOPROL-XL) 50 MG 24 hr tablet Take 50 mg by  mouth daily. Take with or immediately following a meal.   Multiple Vitamins-Minerals (ICAPS AREDS 2) CAPS Take 1 capsule by mouth 2 (two) times daily.    rosuvastatin (CRESTOR) 20 MG tablet Take by mouth.   valsartan-hydrochlorothiazide (DIOVAN-HCT) 160-25 MG tablet Take 1 tablet by mouth daily.   MODERNA COVID-19 VACCINE 100 MCG/0.5ML injection  (Patient not taking: Reported on 12/29/2021)   No current facility-administered medications for this visit. (Other)   REVIEW OF SYSTEMS: ROS   Positive for: Musculoskeletal, Endocrine, Cardiovascular, Eyes Negative for: Constitutional, Gastrointestinal, Neurological, Skin, Genitourinary, HENT, Respiratory, Psychiatric, Allergic/Imm, Heme/Lymph Last edited by [name], COA on 04/27/2022  1:22 PM.     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    Age 54, led to Mitral valve  prolapse   Past Surgical History:  Procedure Laterality Date   ABDOMINAL HYSTERECTOMY     APPENDECTOMY     BLADDER SUSPENSION     BREAST BIOPSY Left    BREAST EXCISIONAL BIOPSY Left    CATARACT EXTRACTION     CHOLECYSTECTOMY     DILATION AND CURETTAGE OF UTERUS     EYE SURGERY     HEAD & NECK SKIN LESION EXCISIONAL BIOPSY     top left scalp   LUMBAR LAMINECTOMY/DECOMPRESSION MICRODISCECTOMY N/A 08/21/2019   Procedure: Lumbar decompression L3-4 right, microdiscectomy L3-4 right;  Surgeon: [name], MD;  Location: [facility];  Service: Orthopedics;  Laterality: N/A;  68min   SHOULDER ARTHROSCOPY  WITH SUBACROMIAL DECOMPRESSION Right 01/30/2013   Procedure: SHOULDER ARTHROSCOPY WITH SUBACROMIAL DECOMPRESSION;  Surgeon: Magnus Sinning, MD;  Location: [facility];  Service: Orthopedics;  Laterality: Right;  with labral debridement   FAMILY HISTORY Family History  Problem Relation Age of Onset   Heart disease Father    Breast cancer Maternal Aunt    Diabetes Brother    Leukemia Sister    Colon cancer Neg Hx    Esophageal cancer Neg Hx    Rectal cancer Neg Hx    Stomach cancer Neg Hx    SOCIAL HISTORY Social History   Tobacco Use   Smoking status: Never   Smokeless tobacco: Never  Vaping Use   Vaping Use: Never used  Substance Use Topics   Alcohol use: No   Drug use: No       OPHTHALMIC EXAM: Base Eye Exam     Visual Acuity (Snellen - Linear)       Right Left   Dist [place] 20/30 +1 20/250 -2   Dist ph [place] 20/25 -2 20/250 +2         Tonometry (Tonopen, 1:19 PM)       Right Left   Pressure 18 22         Pupils       Dark Light Shape React APD   Right 3 2 Round Brisk None   Left 3 2 Round Brisk None         Visual Fields (Counting fingers)       Left Right    Full Full         Extraocular Movement       Right Left    Full, Ortho Full, Ortho         Neuro/Psych     Oriented x3: Yes   Mood/Affect: Normal         Dilation     Both eyes: 1.0% Mydriacyl, 2.5% Phenylephrine @ 1:19 PM           Slit Lamp and Fundus Exam     Slit Lamp Exam       Right Left   Lids/Lashes Dermatochalasis - upper lid, mild Meibomian gland dysfunction Dermatochalasis - upper lid, Meibomian gland dysfunction   Conjunctiva/Sclera Nasal Pinguecula Nasal Pinguecula   [place] 1+Punctate epithelial erosions, Arcus, Temporal Well healed cataract wounds, trace Debris in tear film 1+ Punctate epithelial erosions, Arcus, Temporal Well healed cataract wounds, trace Debris in tear film   Anterior Chamber Deep and quiet Deep and quiet   Iris Round and moderately  dilated Round and dilated   Lens PC IOL in good position, 1+ PCO PC IOL in good position, 1-2+Posterior capsular opacification   Anterior Vitreous Vitreous syneresis, Posterior vitreous detachment, vitreous condensations Vitreous syneresis, Posterior vitreous detachment  Fundus Exam       Right Left   Disc Pink and Sharp Pink and Sharp, mild temporal Peripapillary atrophy, mild PPP   C/D Ratio 0.4 0.4   Macula Flat, Blunted foveal reflex, RPE mottling and clumping, mild atrophy, Drusen, rare MA, No edema Blunted foveal reflex, central cystic changes / edema - improved, +CNV now with pigment ring, no heme, +drusen, RPE mottling   Vessels attenuated, Tortuous attenuated, mild tortuosity   Periphery Attached, scattered mild Reticular degeneration.  No heme. Attached, scattered Reticular degeneration, No heme.           IMAGING AND PROCEDURES  Imaging and Procedures for @TODAY @  OCT, Retina - OU - Both Eyes       Right Eye Quality was good. Central Foveal Thickness: 286. Progression has been stable. Findings include normal foveal contour, no SRF, retinal drusen , no IRF, myopic contour, outer retinal atrophy.   Left Eye Quality was good. Central Foveal Thickness: 275. Progression has improved. Findings include abnormal foveal contour, retinal drusen , pigment epithelial detachment, outer retinal atrophy, intraretinal hyper-reflective material, subretinal hyper-reflective material, no SRF, no IRF (Interval improvement in IRF/cystic changes -- resolved, no SRF).   Notes *Images captured and stored on drive  Diagnosis / Impression: Mild DME OU OD: nonexudative ARMD - stable drusen OS: Exudative ARMD - Interval improvement in IRF/cystic changes -- resolved, no SRF  Clinical management:  See below  Abbreviations: NFP - Normal foveal profile. CME - cystoid macular edema. PED - pigment epithelial detachment. IRF - intraretinal fluid. SRF - subretinal fluid. EZ - ellipsoid  zone. ERM - epiretinal membrane. ORA - outer retinal atrophy. ORT - outer retinal tubulation. SRHM - subretinal hyper-reflective material       Intravitreal Injection, Pharmacologic Agent - OS - Left Eye       Time Out 04/27/2022. 2:00 PM. Confirmed correct patient, procedure, site, and patient consented.   Anesthesia Topical anesthesia was used. Anesthetic medications included Lidocaine 2%, Proparacaine 0.5%.   Procedure Preparation included 5% betadine to ocular surface, eyelid speculum. A (32g) needle was used.   Injection: 1.25 mg Bevacizumab 1.25mg /0.20ml   Route: Intravitreal, Site: Left Eye   NDC: H061816, LotNC:[id number], Expiration date: 06/05/2022   Post-op Post injection exam found visual acuity of at least counting fingers. The patient tolerated the procedure well. There were no complications. The patient received written and verbal post procedure care education.            ASSESSMENT/PLAN:   ICD-10-CM   1. Exudative age-related macular degeneration of left eye with active choroidal neovascularization (HCC)  H35.3221 OCT, Retina - OU - Both Eyes    Intravitreal Injection, Pharmacologic Agent - OS - Left Eye    Bevacizumab (AVASTIN) SOLN 1.25 mg    2. Intermediate stage nonexudative age-related macular degeneration of right eye  H35.3112     3. Both eyes affected by mild nonproliferative diabetic retinopathy with macular edema, associated with type 2 diabetes mellitus ([place])  [id number]     4. Amblyopia of left eye  H53.002     5. Posterior vitreous detachment of left eye  H43.812     6. Pseudophakia of both eyes  Z96.1     7. Dry eyes  H04.123      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, OS   - interval conversion to exudative ARMD noted on 7.26.22             - s/p IVA OS #1 (07.26.22), #  2 (08.23.22), #3 (09.21.22), #4 (10.13.22), #5 (11.16.22), #6 (12.14.22), #7 (01.11.23), #8 (02.08.23), #9 (03.22.23)  - BCVA 20/250 OS (stable and at amblyopic  baseline)  - OCT shows mild Interval improvement in cystic changes superior and temporal fovea, no SRF at 6 weeks  - recommend IVA OS #10 today, 06.07.23 w/ ext f/u to 8 wks  - pt wishes to be treated with IVA - RBA of procedure discussed, questions answered - informed consent obtained and signed - see procedure note  - f/u in 8 wks -- DFE/OCT/likely inj.  2. Intermediate Age related macular degeneration, non-exudative, OD -- stable  - exam/OCT today shows OD: stable drusen  - suspect cystic changes mostly related to DM and HTN  - BCVA OD 20/25  - FA 01.04.21 - no CNVM OU -- no exudative disease  - continue amsler grid monitoring  3. Mild non-proliferative diabetic retinopathy, both eyes - exam shows rare MA/IRH - OCT shows mild diabetic macular edema/cystic changes, both eyes  - FA 01.04.21 - late leaking, perifoveal MA OU; no NV OU - monitor  4. Severe amblyopia OS  - history of patching as a child -- no strabismus surgery  - VA dropped down to 20/350 from 20/200 (baseline) due to conv to exu ARMD on 7.26.22  - now back to baseline, 20/250--OS  5. PVD / vitreous syneresis OS  - Discussed findings and prognosis  - No RT or RD on 360 scleral depressed exam  - Reviewed s/s of RT/RD  - Strict return precautions for any such RT/RD signs/symptoms  6. Pseudophakia OU  - s/p CE/IOL OU by [name]  - beautiful surgeries, doing well  - monitor  7. Dry eyes OU - improving  - improved PEE on slit lamp  - cont artificial tears QID and lubricating ointment qhs as needed  Ophthalmic Meds Ordered this visit:  Meds ordered this encounter  Medications   Bevacizumab (AVASTIN) SOLN 1.25 mg     Return in about 8 weeks (around 06/22/2022) for f/u exu ARMD OS, DFE, OCT.  There are no Patient Instructions on file for this visit.  This document serves as a record of services personally performed by Gardiner Sleeper, MD, PhD. It was created on their behalf by Orvan Falconer, an ophthalmic  technician. The creation of this record is the provider's dictation and/or activities during the visit.    Electronically signed by: Orvan Falconer, OA, 04/27/22  4:03 PM  Gardiner Sleeper, M.D., Ph.D. Diseases & Surgery of the Retina and Vitreous [facility]  I have reviewed the above documentation for accuracy and completeness, and I agree with the above. Gardiner Sleeper, M.D., Ph.D. 04/27/22 4:05 PM  Abbreviations: M myopia (nearsighted); A astigmatism; H hyperopia (farsighted); P presbyopia; Mrx spectacle prescription;  CTL contact lenses; OD right eye; OS left eye; OU both eyes  XT exotropia; ET esotropia; PEK punctate epithelial keratitis; PEE punctate epithelial erosions; DES dry eye syndrome; MGD meibomian gland dysfunction; ATs artificial tears; PFAT's preservative free artificial tears; [place] nuclear sclerotic cataract; PSC posterior subcapsular cataract; ERM epi-retinal membrane; PVD posterior vitreous detachment; RD retinal detachment; DM diabetes mellitus; DR diabetic retinopathy; NPDR non-proliferative diabetic retinopathy; PDR proliferative diabetic retinopathy; CSME clinically significant macular edema; DME diabetic macular edema; dbh dot blot hemorrhages; CWS cotton wool spot; POAG primary open angle glaucoma; C/D cup-to-disc ratio; HVF humphrey visual field; GVF goldmann visual field; OCT optical coherence tomography; IOP intraocular pressure; BRVO Branch retinal vein occlusion; CRVO  central retinal vein occlusion; CRAO central retinal artery occlusion; BRAO branch retinal artery occlusion; RT retinal tear; SB scleral buckle; PPV pars plana vitrectomy; VH Vitreous hemorrhage; PRP panretinal laser photocoagulation; IVK intravitreal kenalog; VMT vitreomacular traction; MH Macular hole;  NVD neovascularization of the disc; NVE neovascularization elsewhere; AREDS age related eye disease study; ARMD age related macular degeneration; POAG primary open angle glaucoma; EBMD  epithelial/anterior basement membrane dystrophy; ACIOL anterior chamber intraocular lens; IOL intraocular lens; PCIOL posterior chamber intraocular lens; Phaco/IOL phacoemulsification with intraocular lens placement; [place] photorefractive keratectomy; LASIK laser assisted in situ keratomileusis; HTN hypertension; DM diabetes mellitus; COPD chronic obstructive pulmonary disease

## 2022-04-27 ENCOUNTER — Encounter (INDEPENDENT_AMBULATORY_CARE_PROVIDER_SITE_OTHER): Payer: Self-pay | Admitting: Ophthalmology

## 2022-04-27 ENCOUNTER — Ambulatory Visit (INDEPENDENT_AMBULATORY_CARE_PROVIDER_SITE_OTHER): Payer: Medicare HMO | Admitting: Ophthalmology

## 2022-04-27 DIAGNOSIS — H04123 Dry eye syndrome of bilateral lacrimal glands: Secondary | ICD-10-CM | POA: Diagnosis not present

## 2022-04-27 DIAGNOSIS — Z961 Presence of intraocular lens: Secondary | ICD-10-CM | POA: Diagnosis not present

## 2022-04-27 DIAGNOSIS — H353112 Nonexudative age-related macular degeneration, right eye, intermediate dry stage: Secondary | ICD-10-CM | POA: Diagnosis not present

## 2022-04-27 DIAGNOSIS — H353221 Exudative age-related macular degeneration, left eye, with active choroidal neovascularization: Secondary | ICD-10-CM | POA: Diagnosis not present

## 2022-04-27 DIAGNOSIS — H43812 Vitreous degeneration, left eye: Secondary | ICD-10-CM

## 2022-04-27 DIAGNOSIS — E113213 Type 2 diabetes mellitus with mild nonproliferative diabetic retinopathy with macular edema, bilateral: Secondary | ICD-10-CM

## 2022-04-27 DIAGNOSIS — H53002 Unspecified amblyopia, left eye: Secondary | ICD-10-CM

## 2022-04-27 MED ORDER — BEVACIZUMAB CHEMO INJECTION 1.25MG/0.05ML SYRINGE FOR KALEIDOSCOPE
1.2500 mg | INTRAVITREAL | Status: AC | PRN
  Administered 2022-04-27: 1.25 mg via INTRAVITREAL

## 2022-05-30 DIAGNOSIS — E785 Hyperlipidemia, unspecified: Secondary | ICD-10-CM | POA: Diagnosis not present

## 2022-05-30 DIAGNOSIS — N182 Chronic kidney disease, stage 2 (mild): Secondary | ICD-10-CM | POA: Diagnosis not present

## 2022-05-30 DIAGNOSIS — E1129 Type 2 diabetes mellitus with other diabetic kidney complication: Secondary | ICD-10-CM | POA: Diagnosis not present

## 2022-05-30 DIAGNOSIS — I129 Hypertensive chronic kidney disease with stage 1 through stage 4 chronic kidney disease, or unspecified chronic kidney disease: Secondary | ICD-10-CM | POA: Diagnosis not present

## 2022-05-30 DIAGNOSIS — I700 Atherosclerosis of aorta: Secondary | ICD-10-CM | POA: Diagnosis not present

## 2022-06-07 ENCOUNTER — Other Ambulatory Visit: Payer: Self-pay | Admitting: Internal Medicine

## 2022-06-07 DIAGNOSIS — Z1231 Encounter for screening mammogram for malignant neoplasm of breast: Secondary | ICD-10-CM

## 2022-06-16 NOTE — Progress Notes (Signed)
[facility] Note  06/22/2022     CHIEF COMPLAINT Patient presents for Retina Follow Up   HISTORY OF PRESENT ILLNESS: Monica Hamilton is a 81 y.o. female who presents to the clinic today for:   HPI     Retina Follow Up   Patient presents with  Wet AMD.  In left eye.  This started 8 weeks ago.  I, the attending physician,  performed the HPI with the patient and updated documentation appropriately.        Comments   Patient here for 8 weeks retina follow up for exu ARMD OS. Patient states vision OS the same. OD got weaker. No eye pain.       Last edited by [name], MD on 06/22/2022  4:34 PM.       Referring physician: Self-referral -- wife of Brock Ra  HISTORICAL INFORMATION:  Selected notes from the MEDICAL RECORD NUMBER Self referral Ocular Hx: pseudophakia OU [name] 2019) PMH: DM (on metformin), HTN, arthritis,    CURRENT MEDICATIONS: No current outpatient medications on file. (Ophthalmic Drugs)   No current facility-administered medications for this visit. (Ophthalmic Drugs)   Current Outpatient Medications (Other)  Medication Sig   amLODipine (NORVASC) 5 MG tablet Take 5 mg by mouth daily.   aspirin 81 MG EC tablet Take 81 mg by mouth daily.    CINNAMON PO Take 1,000 mg by mouth daily.   gabapentin (NEURONTIN) 100 MG capsule gabapentin 100 mg capsule  TAKE 1 CAPSULE BY MOUTH ONCE DAILY AT BEDTIME FOR 30 DAYS   gabapentin (NEURONTIN) 300 MG capsule Take 600 mg by mouth at bedtime.   HYDROcodone-acetaminophen (NORCO) 7.5-325 MG tablet Take 1 tablet by mouth every 6 (six) hours as needed for severe pain ((score 7 to 10)).   metFORMIN (GLUCOPHAGE) 1000 MG tablet Take 1,000 mg by mouth 2 (two) times daily.    methocarbamol (ROBAXIN) 500 MG tablet Take 1 tablet (500 mg total) by mouth every 6 (six) hours as needed for muscle spasms.   metoprolol succinate (TOPROL-XL) 50 MG 24 hr tablet Take 50 mg by mouth daily. Take with or  immediately following a meal.   Multiple Vitamins-Minerals (ICAPS AREDS 2) CAPS Take 1 capsule by mouth 2 (two) times daily.    rosuvastatin (CRESTOR) 20 MG tablet Take by mouth.   valsartan-hydrochlorothiazide (DIOVAN-HCT) 160-25 MG tablet Take 1 tablet by mouth daily.   MODERNA COVID-19 VACCINE 100 MCG/0.5ML injection  (Patient not taking: Reported on 12/29/2021)   No current facility-administered medications for this visit. (Other)   REVIEW OF SYSTEMS: ROS   Positive for: Musculoskeletal, Endocrine, Cardiovascular, Eyes Negative for: Constitutional, Gastrointestinal, Neurological, Skin, Genitourinary, HENT, Respiratory, Psychiatric, Allergic/Imm, Heme/Lymph Last edited by [name], COA on 06/22/2022  2:44 PM.      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    Age 51, led to Mitral valve prolapse  Past Surgical History:  Procedure Laterality Date   ABDOMINAL HYSTERECTOMY     APPENDECTOMY     BLADDER SUSPENSION     BREAST BIOPSY Left    BREAST EXCISIONAL BIOPSY Left    CATARACT EXTRACTION     CHOLECYSTECTOMY     DILATION AND CURETTAGE OF UTERUS     EYE SURGERY     HEAD & NECK SKIN LESION EXCISIONAL BIOPSY     top left scalp   LUMBAR LAMINECTOMY/DECOMPRESSION MICRODISCECTOMY N/A 08/21/2019   Procedure: Lumbar decompression L3-4 right, microdiscectomy L3-4 right;  Surgeon: Ranee Gosselin, MD;  Location: [facility];  Service: Orthopedics;  Laterality: N/A;    SHOULDER ARTHROSCOPY WITH SUBACROMIAL  DECOMPRESSION Right 01/30/2013   Procedure: SHOULDER ARTHROSCOPY WITH SUBACROMIAL DECOMPRESSION;  Surgeon: Drucilla Schmidt, MD;  Location: [facility];  Service: Orthopedics;  Laterality: Right;  with labral debridement   FAMILY HISTORY Family History  Problem Relation Age of Onset   Heart disease Father    Breast cancer Maternal Aunt    Diabetes Brother    Leukemia Sister    Colon cancer Neg Hx    Esophageal cancer Neg Hx    Rectal cancer Neg Hx    Stomach cancer Neg Hx    SOCIAL HISTORY Social History   Tobacco Use   Smoking status: Never   Smokeless tobacco: Never  Vaping Use   Vaping Use: Never used  Substance Use Topics   Alcohol use: No   Drug use: No       OPHTHALMIC EXAM: Base Eye Exam     Visual Acuity (Snellen - Linear)       Right Left   Dist [place] 20/30 -2 20/250 -2   Dist ph [place] 20/25 -2 20/250 +2         Tonometry (Tonopen, 2:42 PM)       Right Left   Pressure 16 20         Pupils       Dark Light Shape React APD   Right 3 2 Round Brisk None   Left 3 2 Round Brisk None         Visual Fields (Counting fingers)       Left Right    Full Full         Extraocular Movement       Right Left    Full, Ortho Full, Ortho         Neuro/Psych     Oriented x3: Yes   Mood/Affect: Normal         Dilation     Both eyes: 1.0% Mydriacyl, 2.5% Phenylephrine @ 2:42 PM           Slit Lamp and Fundus Exam     Slit Lamp Exam       Right Left   Lids/Lashes Dermatochalasis - upper lid, mild Meibomian gland dysfunction Dermatochalasis - upper lid, Meibomian gland dysfunction   Conjunctiva/Sclera Nasal Pinguecula Nasal Pinguecula   Cornea 1+Punctate epithelial erosions, Arcus, Temporal Well healed cataract wounds, trace Debris in tear film 1+ Punctate epithelial erosions, Arcus, Temporal Well healed cataract wounds, trace Debris in tear film   Anterior Chamber Deep and quiet Deep and quiet   Iris Round and moderately dilated Round and  dilated   Lens PC IOL in good position, 1+ PCO PC IOL in good position, 1-2+Posterior capsular opacification   Anterior Vitreous Vitreous syneresis, Posterior vitreous detachment, vitreous condensations Vitreous syneresis, Posterior vitreous detachment  Fundus Exam       Right Left   Disc Pink and Sharp Pink and Sharp, mild temporal Peripapillary atrophy, mild PPP   C/D Ratio 0.4 0.4   Macula Flat, Blunted foveal reflex, RPE mottling and clumping, mild atrophy, Drusen, rare MA, No edema Blunted foveal reflex, central cystic changes / edema - improved, +CNV now with pigment ring, no heme, +drusen, RPE mottling   Vessels attenuated, Tortuous attenuated, mild tortuosity   Periphery Attached, scattered mild Reticular degeneration.  No heme. Attached, scattered Reticular degeneration, No heme.           IMAGING AND PROCEDURES  Imaging and Procedures for @TODAY @  OCT, Retina - OU - Both Eyes       Right Eye Quality was good. Central Foveal Thickness: 282. Progression has been stable. Findings include normal foveal contour, no IRF, no SRF, myopic contour, retinal drusen , outer retinal atrophy.   Left Eye Quality was good. Central Foveal Thickness: 266. Progression has been stable. Findings include no IRF, no SRF, abnormal foveal contour, retinal drusen , subretinal hyper-reflective material, intraretinal hyper-reflective material, pigment epithelial detachment, outer retinal atrophy (stable improvement in IRF/cystic changes -- resolved, no SRF, persistent PED).   Notes *Images captured and stored on drive  Diagnosis / Impression: Mild DME OU OD: nonexudative ARMD - stable drusen OS: Exudative ARMD - stable improvement in IRF/cystic changes -- resolved, no SRF, persistent PED  Clinical management:  See below  Abbreviations: NFP - Normal foveal profile. CME - cystoid macular edema. PED - pigment epithelial detachment. IRF - intraretinal fluid. SRF - subretinal fluid. EZ -  ellipsoid zone. ERM - epiretinal membrane. ORA - outer retinal atrophy. ORT - outer retinal tubulation. SRHM - subretinal hyper-reflective material       Intravitreal Injection, Pharmacologic Agent - OS - Left Eye       Time Out 06/22/2022. 3:25 PM. Confirmed correct patient, procedure, site, and patient consented.   Anesthesia Topical anesthesia was used. Anesthetic medications included Lidocaine 2%, Proparacaine 0.5%.   Procedure Preparation included 5% betadine to ocular surface, eyelid speculum. A (32g) needle was used.   Injection: 1.25 mg Bevacizumab 1.25mg /0.76ml   Route: Intravitreal, Site: Left Eye   NDC: H061816, LotWM:[id number], Expiration date: 09/23/2022   Post-op Post injection exam found visual acuity of at least counting fingers. The patient tolerated the procedure well. There were no complications. The patient received written and verbal post procedure care education. Post injection medications were not given.            ASSESSMENT/PLAN:   ICD-10-CM   1. Exudative age-related macular degeneration of left eye with active choroidal neovascularization (HCC)  H35.3221 OCT, Retina - OU - Both Eyes    Intravitreal Injection, Pharmacologic Agent - OS - Left Eye    Bevacizumab (AVASTIN) SOLN 1.25 mg    2. Intermediate stage nonexudative age-related macular degeneration of right eye  H35.3112     3. Both eyes affected by mild nonproliferative diabetic retinopathy with macular edema, associated with type 2 diabetes mellitus ([place])  [id number]     4. Amblyopia of left eye  H53.002     5. Posterior vitreous detachment of left eye  H43.812     6. Pseudophakia of both eyes  Z96.1     7. Dry eyes  H04.123      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, OS   - interval conversion to exudative ARMD noted on 7.26.22             -  s/p IVA OS #1 (07.26.22), #2 (08.23.22), #3 (09.21.22), #4 (10.13.22), #5 (11.16.22), #6 (12.14.22), #7 (01.11.23), #8 (02.08.23),  #9 (03.22.23), #10 (06.07.23)  - BCVA 20/250 OS (stable and at amblyopic baseline)  - OCT shows stable improvement in cystic changes superior and temporal fovea, no SRF, persistent PED at 8 weeks  - recommend IVA OS #11 today, 08.02.23 w/ ext f/u to 12 wks  - pt wishes to be treated with IVA - RBA of procedure discussed, questions answered - informed consent obtained and signed - see procedure note   - f/u in 12 wks -- DFE/OCT  2. Intermediate Age related macular degeneration, non-exudative, OD -- stable  - exam/OCT today shows OD: stable drusen  - suspect cystic changes mostly related to DM and HTN  - BCVA OD 20/25  - FA 01.04.21 - no CNVM OU -- no exudative disease  - continue amsler grid monitoring  3. Mild non-proliferative diabetic retinopathy, both eyes - exam shows rare MA/IRH - OCT shows mild diabetic macular edema/cystic changes, both eyes  - FA 01.04.21 - late leaking, perifoveal MA OU; no NV OU - monitor  4. Severe amblyopia OS  - history of patching as a child -- no strabismus surgery  - VA dropped down to 20/350 from 20/200 (baseline) due to conv to exu ARMD on 7.26.22  - now back to baseline, 20/250--OS  5. PVD / vitreous syneresis OS  - Discussed findings and prognosis  - No RT or RD on 360 scleral depressed exam  - Reviewed s/s of RT/RD  - Strict return precautions for any such RT/RD signs/symptoms  6. Pseudophakia OU  - s/p CE/IOL OU by [name]  - beautiful surgeries, doing well  - monitor  7. Dry eyes OU - improving  - improved PEE on slit lamp  - cont artificial tears QID and lubricating ointment qhs as needed  Ophthalmic Meds Ordered this visit:  Meds ordered this encounter  Medications   Bevacizumab (AVASTIN) SOLN 1.25 mg     Return in about 12 weeks (around 09/14/2022) for f/u exu ARMD OS, DFE, OCT.  There are no Patient Instructions on file for this visit.  This document serves as a record of services personally performed by Karie Chimera,  MD, PhD. It was created on their behalf by De Blanch, an ophthalmic technician. The creation of this record is the provider's dictation and/or activities during the visit.    Electronically signed by: De Blanch, OA, 06/22/22  4:35 PM  This document serves as a record of services personally performed by Karie Chimera, MD, PhD. It was created on their behalf by Glee Arvin. Manson Passey, OA an ophthalmic technician. The creation of this record is the provider's dictation and/or activities during the visit.    Electronically signed by: Glee Arvin. Manson Passey, [place] 08.02.2023 4:35 PM   Karie Chimera, M.D., Ph.D. Diseases & Surgery of the Retina and Vitreous [facility]  I have reviewed the above documentation for accuracy and completeness, and I agree with the above. Karie Chimera, M.D., Ph.D. 06/22/22 4:36 PM  Abbreviations: M myopia (nearsighted); A astigmatism; H hyperopia (farsighted); P presbyopia; Mrx spectacle prescription;  CTL contact lenses; OD right eye; OS left eye; OU both eyes  XT exotropia; ET esotropia; PEK punctate epithelial keratitis; PEE punctate epithelial erosions; DES dry eye syndrome; MGD meibomian gland dysfunction; ATs artificial tears; PFAT's preservative free artificial tears; NSC nuclear sclerotic cataract; PSC posterior subcapsular cataract; ERM epi-retinal membrane;  PVD posterior vitreous detachment; RD retinal detachment; DM diabetes mellitus; DR diabetic retinopathy; NPDR non-proliferative diabetic retinopathy; PDR proliferative diabetic retinopathy; CSME clinically significant macular edema; DME diabetic macular edema; dbh dot blot hemorrhages; CWS cotton wool spot; POAG primary open angle glaucoma; C/D cup-to-disc ratio; HVF humphrey visual field; GVF goldmann visual field; OCT optical coherence tomography; IOP intraocular pressure; BRVO Branch retinal vein occlusion; CRVO central retinal vein occlusion; CRAO central retinal artery occlusion;  BRAO branch retinal artery occlusion; RT retinal tear; SB scleral buckle; PPV pars plana vitrectomy; VH Vitreous hemorrhage; PRP panretinal laser photocoagulation; IVK intravitreal kenalog; VMT vitreomacular traction; MH Macular hole;  NVD neovascularization of the disc; NVE neovascularization elsewhere; AREDS age related eye disease study; ARMD age related macular degeneration; POAG primary open angle glaucoma; EBMD epithelial/anterior basement membrane dystrophy; ACIOL anterior chamber intraocular lens; IOL intraocular lens; PCIOL posterior chamber intraocular lens; Phaco/IOL phacoemulsification with intraocular lens placement; [place] photorefractive keratectomy; LASIK laser assisted in situ keratomileusis; HTN hypertension; DM diabetes mellitus; COPD chronic obstructive pulmonary disease

## 2022-06-22 ENCOUNTER — Encounter (INDEPENDENT_AMBULATORY_CARE_PROVIDER_SITE_OTHER): Payer: Self-pay | Admitting: Ophthalmology

## 2022-06-22 ENCOUNTER — Ambulatory Visit (INDEPENDENT_AMBULATORY_CARE_PROVIDER_SITE_OTHER): Payer: Medicare HMO | Admitting: Ophthalmology

## 2022-06-22 DIAGNOSIS — H43812 Vitreous degeneration, left eye: Secondary | ICD-10-CM

## 2022-06-22 DIAGNOSIS — H53002 Unspecified amblyopia, left eye: Secondary | ICD-10-CM

## 2022-06-22 DIAGNOSIS — Z961 Presence of intraocular lens: Secondary | ICD-10-CM

## 2022-06-22 DIAGNOSIS — H353112 Nonexudative age-related macular degeneration, right eye, intermediate dry stage: Secondary | ICD-10-CM

## 2022-06-22 DIAGNOSIS — E113213 Type 2 diabetes mellitus with mild nonproliferative diabetic retinopathy with macular edema, bilateral: Secondary | ICD-10-CM

## 2022-06-22 DIAGNOSIS — H353221 Exudative age-related macular degeneration, left eye, with active choroidal neovascularization: Secondary | ICD-10-CM | POA: Diagnosis not present

## 2022-06-22 DIAGNOSIS — H04123 Dry eye syndrome of bilateral lacrimal glands: Secondary | ICD-10-CM

## 2022-06-22 MED ORDER — BEVACIZUMAB CHEMO INJECTION 1.25MG/0.05ML SYRINGE FOR KALEIDOSCOPE
1.2500 mg | INTRAVITREAL | Status: AC | PRN
  Administered 2022-06-22: 1.25 mg via INTRAVITREAL

## 2022-07-12 ENCOUNTER — Ambulatory Visit
Admission: RE | Admit: 2022-07-12 | Discharge: 2022-07-12 | Disposition: A | Discharge status: Home / Self Care | Payer: Medicare HMO | Source: Ambulatory Visit | Attending: Internal Medicine | Admitting: Internal Medicine

## 2022-07-12 DIAGNOSIS — Z1231 Encounter for screening mammogram for malignant neoplasm of breast: Secondary | ICD-10-CM

## 2022-08-29 DIAGNOSIS — I129 Hypertensive chronic kidney disease with stage 1 through stage 4 chronic kidney disease, or unspecified chronic kidney disease: Secondary | ICD-10-CM | POA: Diagnosis not present

## 2022-08-29 DIAGNOSIS — M5416 Radiculopathy, lumbar region: Secondary | ICD-10-CM | POA: Diagnosis not present

## 2022-08-29 DIAGNOSIS — E1129 Type 2 diabetes mellitus with other diabetic kidney complication: Secondary | ICD-10-CM | POA: Diagnosis not present

## 2022-09-12 NOTE — Progress Notes (Signed)
[facility] Note  09/14/2022     CHIEF COMPLAINT Patient presents for Retina Follow Up   HISTORY OF PRESENT ILLNESS: Monica Hamilton is a 81 y.o. female who presents to the clinic today for:   HPI     Retina Follow Up   Patient presents with  Wet AMD.  In left eye.  This started 12 weeks ago.  I, the attending physician,  performed the HPI with the patient and updated documentation appropriately.        Comments   Patient here for 12 weeks retina follow up for exu ARMD OS. Patient states vision is doing alright. No eye pain.       Last edited by [name], MD on 09/14/2022  5:17 PM.     Referring physician: Self-referral -- wife of Brock Ra  HISTORICAL INFORMATION:  Selected notes from the MEDICAL RECORD NUMBER Self referral Ocular Hx: pseudophakia OU [name] 2019) PMH: DM (on metformin), HTN, arthritis,    CURRENT MEDICATIONS: No current outpatient medications on file. (Ophthalmic Drugs)   No current facility-administered medications for this visit. (Ophthalmic Drugs)   Current Outpatient Medications (Other)  Medication Sig   amLODipine (NORVASC) 5 MG tablet Take 5 mg by mouth daily.   aspirin 81 MG EC tablet Take 81 mg by mouth daily.    CINNAMON PO Take 1,000 mg by mouth daily.   gabapentin (NEURONTIN) 100 MG capsule gabapentin 100 mg capsule  TAKE 1 CAPSULE BY MOUTH ONCE DAILY AT BEDTIME FOR 30 DAYS   gabapentin (NEURONTIN) 300 MG capsule Take 600 mg by mouth at bedtime.   HYDROcodone-acetaminophen (NORCO) 7.5-325 MG tablet Take 1 tablet by mouth every 6 (six) hours as needed for severe pain ((score 7 to 10)).   metFORMIN (GLUCOPHAGE) 1000 MG tablet Take 1,000 mg by mouth 2 (two) times daily.    methocarbamol (ROBAXIN) 500 MG tablet Take 1 tablet (500 mg total) by mouth every 6 (six) hours as needed for muscle spasms.   metoprolol succinate (TOPROL-XL) 50 MG 24 hr tablet Take 50 mg by mouth daily. Take with or immediately  following a meal.   Multiple Vitamins-Minerals (ICAPS AREDS 2) CAPS Take 1 capsule by mouth 2 (two) times daily.    rosuvastatin (CRESTOR) 20 MG tablet Take by mouth.   valsartan-hydrochlorothiazide (DIOVAN-HCT) 160-25 MG tablet Take 1 tablet by mouth daily.   MODERNA COVID-19 VACCINE 100 MCG/0.5ML injection  (Patient not taking: Reported on 12/29/2021)   No current facility-administered medications for this visit. (Other)   REVIEW OF SYSTEMS: ROS   Positive for: Musculoskeletal, Endocrine, Cardiovascular, Eyes Negative for: Constitutional, Gastrointestinal, Neurological, Skin, Genitourinary, HENT, Respiratory, Psychiatric, Allergic/Imm, Heme/Lymph Last edited by [name], COA on 09/14/2022  1:48 PM.     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   Past Surgical History:  Procedure  Laterality Date   ABDOMINAL HYSTERECTOMY     APPENDECTOMY     BLADDER SUSPENSION     BREAST BIOPSY Left    BREAST EXCISIONAL BIOPSY Left    CATARACT EXTRACTION     CHOLECYSTECTOMY     DILATION AND CURETTAGE OF UTERUS     EYE SURGERY     HEAD & NECK SKIN LESION EXCISIONAL BIOPSY     top left scalp   LUMBAR LAMINECTOMY/DECOMPRESSION MICRODISCECTOMY N/A 08/21/2019   Procedure: Lumbar decompression L3-4 right, microdiscectomy L3-4 right;  Surgeon: [name], MD;  Location: [facility];  Service: Orthopedics;  Laterality: N/A;  86min   SHOULDER ARTHROSCOPY WITH SUBACROMIAL DECOMPRESSION Right  01/30/2013   Procedure: SHOULDER ARTHROSCOPY WITH SUBACROMIAL DECOMPRESSION;  Surgeon: Magnus Sinning, MD;  Location: [facility];  Service: Orthopedics;  Laterality: Right;  with labral debridement   FAMILY HISTORY Family History  Problem Relation Age of Onset   Heart disease Father    Breast cancer Maternal Aunt    Diabetes Brother    Leukemia Sister    Colon cancer Neg Hx    Esophageal cancer Neg Hx    Rectal cancer Neg Hx    Stomach cancer Neg Hx    SOCIAL HISTORY Social History   Tobacco Use   Smoking status: Never   Smokeless tobacco: Never  Vaping Use   Vaping Use: Never used  Substance Use Topics   Alcohol use: No   Drug use: No       OPHTHALMIC EXAM: Base Eye Exam     Visual Acuity (Snellen - Linear)       Right Left   Dist [place] 20/30 -2 20/250 -2   Dist ph [place] 20/25 -2 20/250 +1         Tonometry (Tonopen, 1:45 PM)       Right Left   Pressure 20 22         Pupils       Dark Light Shape React APD   Right 3 2 Round Brisk None   Left 3 2 Round Brisk None         Visual Fields (Counting fingers)       Left Right    Full Full         Extraocular Movement       Right Left    Full, Ortho Full, Ortho         Neuro/Psych     Oriented x3: Yes   Mood/Affect: Normal         Dilation     Both eyes: 1.0% Mydriacyl, 2.5% Phenylephrine @ 1:45 PM           Slit Lamp and Fundus Exam     Slit Lamp Exam       Right Left   Lids/Lashes Dermatochalasis - upper lid, mild Meibomian gland dysfunction Dermatochalasis - upper lid, Meibomian gland dysfunction   Conjunctiva/Sclera Nasal Pinguecula Nasal Pinguecula   Cornea 1+Punctate epithelial erosions, Arcus, Temporal Well healed cataract wounds, trace Debris in tear film 1+ Punctate epithelial erosions, Arcus, Temporal Well healed cataract wounds, trace Debris in tear film   Anterior Chamber Deep and quiet Deep and quiet   Iris Round and moderately dilated Round and dilated   Lens PC IOL  in good position, 1+ PCO PC IOL in good position, 1-2+Posterior capsular opacification   Anterior Vitreous Vitreous syneresis, Posterior vitreous detachment, vitreous condensations Vitreous syneresis, Posterior vitreous detachment  Fundus Exam       Right Left   Disc Pink and Sharp Pink and Sharp, mild temporal Peripapillary atrophy, mild PPP   C/D Ratio 0.4 0.4   Macula Flat, Blunted foveal reflex, RPE mottling and clumping, mild atrophy, Drusen, rare MA, No edema Blunted foveal reflex, +CNV  with pigment ring and surrounding edema, no heme, +drusen, RPE mottling   Vessels attenuated, Tortuous attenuated, mild tortuosity   Periphery Attached, scattered mild Reticular degeneration.  No heme. Attached, scattered Reticular degeneration, No heme.           IMAGING AND PROCEDURES  Imaging and Procedures for @TODAY @  OCT, Retina - OU - Both Eyes       Right Eye Quality was good. Central Foveal Thickness: 282. Progression has been stable. Findings include normal foveal contour, no IRF, no SRF, myopic contour, retinal drusen , outer retinal atrophy.   Left Eye Quality was good. Central Foveal Thickness: 288. Progression has worsened. Findings include no IRF, no SRF, abnormal foveal contour, retinal drusen , subretinal hyper-reflective material, intraretinal hyper-reflective material, pigment epithelial detachment, outer retinal atrophy (Persistent focal PED/CNV with interval increase in overlying SRHM and retinal edema).   Notes *Images captured and stored on drive  Diagnosis / Impression: Mild DME OU OD: nonexudative ARMD - stable drusen OS: Exudative ARMD - Persistent focal PED/CNV with interval increase in overlying SRHM and retinal edema -- nasal macula  Clinical management:  See below  Abbreviations: NFP - Normal foveal profile. CME - cystoid macular edema. PED - pigment epithelial detachment. IRF - intraretinal fluid. SRF - subretinal fluid. EZ - ellipsoid zone. ERM -  epiretinal membrane. ORA - outer retinal atrophy. ORT - outer retinal tubulation. SRHM - subretinal hyper-reflective material       Intravitreal Injection, Pharmacologic Agent - OS - Left Eye       Time Out 09/14/2022. 2:41 PM. Confirmed correct patient, procedure, site, and patient consented.   Anesthesia Topical anesthesia was used. Anesthetic medications included Lidocaine 2%, Proparacaine 0.5%.   Procedure Preparation included 5% betadine to ocular surface, eyelid speculum. A (32g) needle was used.   Injection: 1.25 mg Bevacizumab 1.25mg /0.45ml   Route: Intravitreal, Site: Left Eye   NDC: H061816, Lot: [id number], Expiration date: 10/19/2022   Post-op Post injection exam found visual acuity of at least counting fingers. The patient tolerated the procedure well. There were no complications. The patient received written and verbal post procedure care education. Post injection medications were not given.            ASSESSMENT/PLAN:   ICD-10-CM   1. Exudative age-related macular degeneration of left eye with active choroidal neovascularization (HCC)  H35.3221 OCT, Retina - OU - Both Eyes    Intravitreal Injection, Pharmacologic Agent - OS - Left Eye    Bevacizumab (AVASTIN) SOLN 1.25 mg    2. Intermediate stage nonexudative age-related macular degeneration of right eye  H35.3112     3. Both eyes affected by mild nonproliferative diabetic retinopathy with macular edema, associated with type 2 diabetes mellitus ([place])  M54.6503     4. Amblyopia of left eye  H53.002     5. Posterior vitreous detachment of left eye  H43.812     6. Pseudophakia of both eyes  Z96.1     7. Dry eyes  H04.123       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, OS   - interval conversion to exudative ARMD noted on 7.26.22             -  s/p IVA OS #1 (07.26.22), #2 (08.23.22), #3 (09.21.22), #4 (10.13.22), #5 (11.16.22), #6 (12.14.22), #7 (01.11.23), #8 (02.08.23), #9 (03.22.23), #10  (06.07.23), #11 (08.02.23)  - BCVA 20/250 OS (stable and at amblyopic baseline) - OCT shows Persistent focal PED/CNV with interval increase in overlying SRHM and retinal edema at 12 weeks  - recommend IVA OS #12 today, 10.25.23 w/ dec f/u to 9 wks  - pt wishes to be treated with IVA OS - RBA of procedure discussed, questions answered - informed consent obtained and signed - see procedure note  - f/u in 9 wks -- DFE/OCT  2. Intermediate Age related macular degeneration, non-exudative, OD -- stable  - exam/OCT today shows OD: stable drusen  - suspect cystic changes mostly related to DM and HTN  - BCVA OD 20/25 - stable   - FA 01.04.21 - no CNVM OU -- no exudative disease  - continue amsler grid monitoring  3. Mild non-proliferative diabetic retinopathy, both eyes - exam shows rare MA/IRH - OCT shows mild diabetic macular edema/cystic changes, both eyes  - FA 01.04.21 - late leaking, perifoveal MA OU; no NV OU - continue to monitor  4. Severe amblyopia OS  - history of patching as a child -- no strabismus surgery  - VA dropped down to 20/350 from 20/200 (baseline) due to conv to exu ARMD on 7.26.22  - now back to baseline, 20/250--OS - stable  5. PVD / vitreous syneresis OS  - Discussed findings and prognosis  - No RT or RD on 360 scleral depressed exam  - Reviewed s/s of RT/RD  - Strict return precautions for any such RT/RD signs/symptoms  6. Pseudophakia OU  - s/p CE/IOL OU by [name]  - beautiful surgeries, doing well  - continue to monitor  7. Dry eyes OU - improving  - improved PEE on slit lamp exam.   - cont artificial tears QID and lubricating ointment qhs as needed  Ophthalmic Meds Ordered this visit:  Meds ordered this encounter  Medications   Bevacizumab (AVASTIN) SOLN 1.25 mg     Return in about 9 weeks (around 11/16/2022) for f/u Ex. AMD OS, DFE, OCT, Possible, IVA, OS.  There are no Patient Instructions on file for this visit.  This document serves as a  record of services personally performed by Gardiner Sleeper, MD, PhD. It was created on their behalf by Orvan Falconer, an ophthalmic technician. The creation of this record is the provider's dictation and/or activities during the visit.    Electronically signed by: Orvan Falconer, OA, 09/14/22  5:20 PM  This document serves as a record of services personally performed by Gardiner Sleeper, MD, PhD. It was created on their behalf by Renaldo Reel, COT an ophthalmic technician. The creation of this record is the provider's dictation and/or activities during the visit.    Electronically signed by:  Renaldo Reel, COT  10.25.23 5:20 PM  Gardiner Sleeper, M.D., Ph.D. Diseases & Surgery of the Retina and [place]  I have reviewed the above documentation for accuracy and completeness, and I agree with the above. Gardiner Sleeper, M.D., Ph.D. 09/14/22 5:22 PM  Abbreviations: M myopia (nearsighted); A astigmatism; H hyperopia (farsighted); P presbyopia; Mrx spectacle prescription;  CTL contact lenses; OD right eye; OS left eye; OU both eyes  XT exotropia; ET esotropia; PEK punctate epithelial keratitis; PEE punctate epithelial erosions; DES dry eye syndrome; MGD meibomian gland dysfunction; ATs artificial tears; PFAT's preservative free  artificial tears; [place] nuclear sclerotic cataract; PSC posterior subcapsular cataract; ERM epi-retinal membrane; PVD posterior vitreous detachment; RD retinal detachment; DM diabetes mellitus; DR diabetic retinopathy; NPDR non-proliferative diabetic retinopathy; PDR proliferative diabetic retinopathy; CSME clinically significant macular edema; DME diabetic macular edema; dbh dot blot hemorrhages; CWS cotton wool spot; POAG primary open angle glaucoma; C/D cup-to-disc ratio; HVF humphrey visual field; GVF goldmann visual field; OCT optical coherence tomography; IOP intraocular pressure; BRVO Branch retinal vein occlusion; CRVO central  retinal vein occlusion; CRAO central retinal artery occlusion; BRAO branch retinal artery occlusion; RT retinal tear; SB scleral buckle; PPV pars plana vitrectomy; VH Vitreous hemorrhage; PRP panretinal laser photocoagulation; IVK intravitreal kenalog; VMT vitreomacular traction; MH Macular hole;  NVD neovascularization of the disc; NVE neovascularization elsewhere; AREDS age related eye disease study; ARMD age related macular degeneration; POAG primary open angle glaucoma; EBMD epithelial/anterior basement membrane dystrophy; ACIOL anterior chamber intraocular lens; IOL intraocular lens; PCIOL posterior chamber intraocular lens; Phaco/IOL phacoemulsification with intraocular lens placement; [place] photorefractive keratectomy; LASIK laser assisted in situ keratomileusis; HTN hypertension; DM diabetes mellitus; COPD chronic obstructive pulmonary disease

## 2022-09-14 ENCOUNTER — Encounter (INDEPENDENT_AMBULATORY_CARE_PROVIDER_SITE_OTHER): Payer: Self-pay | Admitting: Ophthalmology

## 2022-09-14 ENCOUNTER — Ambulatory Visit (INDEPENDENT_AMBULATORY_CARE_PROVIDER_SITE_OTHER): Payer: Medicare HMO | Admitting: Ophthalmology

## 2022-09-14 DIAGNOSIS — E113213 Type 2 diabetes mellitus with mild nonproliferative diabetic retinopathy with macular edema, bilateral: Secondary | ICD-10-CM | POA: Diagnosis not present

## 2022-09-14 DIAGNOSIS — Z961 Presence of intraocular lens: Secondary | ICD-10-CM

## 2022-09-14 DIAGNOSIS — H43812 Vitreous degeneration, left eye: Secondary | ICD-10-CM

## 2022-09-14 DIAGNOSIS — H353221 Exudative age-related macular degeneration, left eye, with active choroidal neovascularization: Secondary | ICD-10-CM

## 2022-09-14 DIAGNOSIS — H353132 Nonexudative age-related macular degeneration, bilateral, intermediate dry stage: Secondary | ICD-10-CM

## 2022-09-14 DIAGNOSIS — H53002 Unspecified amblyopia, left eye: Secondary | ICD-10-CM | POA: Diagnosis not present

## 2022-09-14 DIAGNOSIS — H353112 Nonexudative age-related macular degeneration, right eye, intermediate dry stage: Secondary | ICD-10-CM

## 2022-09-14 DIAGNOSIS — H04123 Dry eye syndrome of bilateral lacrimal glands: Secondary | ICD-10-CM

## 2022-09-14 MED ORDER — BEVACIZUMAB CHEMO INJECTION 1.25MG/0.05ML SYRINGE FOR KALEIDOSCOPE
1.2500 mg | INTRAVITREAL | Status: AC | PRN
  Administered 2022-09-14: 1.25 mg via INTRAVITREAL

## 2022-10-25 DIAGNOSIS — M5441 Lumbago with sciatica, right side: Secondary | ICD-10-CM | POA: Diagnosis not present

## 2022-10-25 DIAGNOSIS — M5137 Other intervertebral disc degeneration, lumbosacral region: Secondary | ICD-10-CM | POA: Diagnosis not present

## 2022-11-07 ENCOUNTER — Other Ambulatory Visit (HOSPITAL_COMMUNITY): Payer: Self-pay | Admitting: Neurosurgery

## 2022-11-07 DIAGNOSIS — M5441 Lumbago with sciatica, right side: Secondary | ICD-10-CM

## 2022-11-17 NOTE — Progress Notes (Signed)
[facility] - Clinic Note  11/18/2022     CHIEF COMPLAINT Patient presents for Retina Follow Up   HISTORY OF PRESENT ILLNESS: Monica Hamilton is a 81 y.o. female who presents to the clinic today for:   HPI     Retina Follow Up   Patient presents with  Wet AMD.  This started 9 weeks ago.  Duration of 9.  I, the attending physician,  performed the HPI with the patient and updated documentation appropriately.        Comments   9 week retina follow up ARMD OS and IVA OS pt is reporting no vision changes noticed       Last edited by Rennis Chris, MD on 11/18/2022  3:33 PM.      Referring physician: Self-referral -- wife of Monica Hamilton  HISTORICAL INFORMATION:  Selected notes from the MEDICAL RECORD NUMBER Self referral Ocular Hx: pseudophakia OU Monica Hamilton 2019) PMH: DM (on metformin), HTN, arthritis,    CURRENT MEDICATIONS: Current Outpatient Medications (Ophthalmic Drugs)  Medication Sig   dorzolamide-timolol (COSOPT) 2-0.5 % ophthalmic solution Place 1 drop into both eyes 2 (two) times daily.   No current facility-administered medications for this visit. (Ophthalmic Drugs)   Current Outpatient Medications (Other)  Medication Sig   amLODipine (NORVASC) 5 MG tablet Take 5 mg by mouth daily.   aspirin 81 MG EC tablet Take 81 mg by mouth daily.    CINNAMON PO Take 1,000 mg by mouth daily.   gabapentin (NEURONTIN) 100 MG capsule gabapentin 100 mg capsule  TAKE 1 CAPSULE BY MOUTH ONCE DAILY AT BEDTIME FOR 30 DAYS   gabapentin (NEURONTIN) 300 MG capsule Take 600 mg by mouth at bedtime.   HYDROcodone-acetaminophen (NORCO) 7.5-325 MG tablet Take 1 tablet by mouth every 6 (six) hours as needed for severe pain ((score 7 to 10)).   metFORMIN (GLUCOPHAGE) 1000 MG tablet Take 1,000 mg by mouth 2 (two) times daily.    methocarbamol (ROBAXIN) 500 MG tablet Take 1 tablet (500 mg total) by mouth every 6 (six) hours as needed for muscle spasms.   metoprolol  succinate (TOPROL-XL) 50 MG 24 hr tablet Take 50 mg by mouth daily. Take with or immediately following a meal.   MODERNA COVID-19 VACCINE 100 MCG/0.5ML injection  (Patient not taking: Reported on 12/29/2021)   Multiple Vitamins-Minerals (ICAPS AREDS 2) CAPS Take 1 capsule by mouth 2 (two) times daily.    rosuvastatin (CRESTOR) 20 MG tablet Take by mouth.   valsartan-hydrochlorothiazide (DIOVAN-HCT) 160-25 MG tablet Take 1 tablet by mouth daily.   No current facility-administered medications for this visit. (Other)   REVIEW OF SYSTEMS: ROS   Positive for: Endocrine, Cardiovascular, Eyes Last edited by Rennis Chris, MD on 11/18/2022  3:34 PM.      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   Past Surgical History:  Procedure Laterality Date   ABDOMINAL HYSTERECTOMY     APPENDECTOMY     BLADDER SUSPENSION     BREAST BIOPSY Left    BREAST EXCISIONAL BIOPSY Left    CATARACT EXTRACTION     CHOLECYSTECTOMY     DILATION AND CURETTAGE OF UTERUS     EYE SURGERY     HEAD & NECK SKIN LESION EXCISIONAL BIOPSY     top left scalp   LUMBAR LAMINECTOMY/DECOMPRESSION MICRODISCECTOMY N/A 08/21/2019   Procedure: Lumbar decompression L3-4 right, microdiscectomy L3-4 right;  Surgeon: Ranee GosselinGioffre, Ronald, MD;  Location: [facility];  Service: Orthopedics;  Laterality: N/A;  90min   SHOULDER ARTHROSCOPY WITH SUBACROMIAL DECOMPRESSION Right 01/30/2013   Procedure: SHOULDER ARTHROSCOPY WITH SUBACROMIAL  DECOMPRESSION;  Surgeon: Drucilla SchmidtJames P Aplington, MD;  Location: [facility];  Service: Orthopedics;  Laterality: Right;  with labral debridement   FAMILY HISTORY Family History  Problem Relation Age of Onset   Heart disease Father    Breast cancer Maternal Aunt    Diabetes Brother    Leukemia Sister    Colon cancer Neg Hx    Esophageal cancer Neg Hx    Rectal cancer Neg Hx    Stomach cancer Neg Hx    SOCIAL HISTORY Social History   Tobacco Use   Smoking status: Never   Smokeless tobacco: Never  Vaping Use   Vaping Use: Never used  Substance Use Topics   Alcohol use: No   Drug use: No       OPHTHALMIC EXAM: Base Eye Exam     Visual Acuity (Snellen - Linear)       Right Left   Dist [place] 20/40 +1 20/200 -1   Dist ph [place] NI NI         Tonometry (Tonopen, 1:01 PM)       Right Left   Pressure 24 26  squeezing        Pupils       Pupils Dark Light Shape React APD   Right PERRL 3 2 Round Brisk None   Left PERRL 3 2 Round Brisk None         Visual Fields       Left Right    Full Full         Extraocular Movement       Right Left    Full, Ortho Full, Ortho         Neuro/Psych     Oriented x3: Yes   Mood/Affect: Normal         Dilation     Both eyes: 2.5% Phenylephrine @ 1:01 PM           Slit Lamp and Fundus Exam     Slit Lamp Exam       Right Left   Lids/Lashes Dermatochalasis - upper lid, mild Meibomian gland dysfunction Dermatochalasis - upper lid, Meibomian gland dysfunction   Conjunctiva/Sclera nasal and temporal pinguecula Nasal Pinguecula   Cornea Trace Punctate epithelial erosions, Arcus, Temporal Well healed cataract wounds, trace Debris in tear film 1+ Punctate epithelial erosions, Arcus, Temporal Well healed cataract wounds, trace Debris in tear film   Anterior Chamber Deep and quiet Deep and quiet   Iris Round and dilated Round and dilated   Lens PC IOL in good position, 1-2+ PCO PC IOL in good position, 1-2+Posterior capsular  opacification   Anterior Vitreous Vitreous syneresis, Posterior vitreous detachment, vitreous condensations Vitreous syneresis, Posterior vitreous detachment  Fundus Exam       Right Left   Disc Pink and Sharp Pink and Sharp, mild temporal Peripapillary atrophy, mild PPP   C/D Ratio 0.5 0.4   Macula Flat, Blunted foveal reflex, RPE mottling and clumping, mild atrophy, Drusen, rare MA, No edema Blunted foveal reflex, +CNV with pigment ring and surrounding edema -- improved, no heme, +drusen, RPE mottling   Vessels attenuated, Tortuous attenuated, mild tortuosity   Periphery Attached, scattered mild Reticular degeneration.  No heme. Attached, scattered Reticular degeneration, No heme.           IMAGING AND PROCEDURES  Imaging and Procedures for @TODAY @  OCT, Retina - OU - Both Eyes       Right Eye Quality was poor. Central Foveal Thickness: 306. Progression has been stable. Findings include normal foveal contour, no IRF, no SRF, myopic contour, retinal drusen , outer retinal atrophy (Patchy ORA - stable).   Left Eye Quality was good. Central Foveal Thickness: 266. Progression has improved. Findings include no IRF, no SRF, abnormal foveal contour, retinal drusen , subretinal hyper-reflective material, intraretinal hyper-reflective material, pigment epithelial detachment, outer retinal atrophy (Persistent focal PED/CNV with interval improvement in overlying SRHM and retinal edema).   Notes *Images captured and stored on drive  Diagnosis / Impression: Mild DME OU OD: nonexudative ARMD - stable drusen OS: Exudative ARMD - Persistent focal PED/CNV with interval improvement in overlying SRHM and retinal edema -- nasal macula  Clinical management:  See below  Abbreviations: NFP - Normal foveal profile. CME - cystoid macular edema. PED - pigment epithelial detachment. IRF - intraretinal fluid. SRF - subretinal fluid. EZ - ellipsoid zone. ERM - epiretinal membrane. ORA - outer  retinal atrophy. ORT - outer retinal tubulation. SRHM - subretinal hyper-reflective material       Intravitreal Injection, Pharmacologic Agent - OS - Left Eye       Time Out 11/18/2022. 1:33 PM. Confirmed correct patient, procedure, site, and patient consented.   Anesthesia Topical anesthesia was used. Anesthetic medications included Lidocaine 2%, Proparacaine 0.5%.   Procedure Preparation included 5% betadine to ocular surface, eyelid speculum. A supplied (32g) needle was used.   Injection: 1.25 mg Bevacizumab 1.25mg /0.19ml   Route: Intravitreal, Site: Left Eye   NDC0m, Lot: [id number] , Expiration date: 12/07/2022   Post-op Post injection exam found visual acuity of at least counting fingers. The patient tolerated the procedure well. There were no complications. The patient received written and verbal post procedure care education. Post injection medications were not given.            ASSESSMENT/PLAN:   ICD-10-CM   1. Exudative age-related macular degeneration of left eye with active choroidal neovascularization (HCC)  H35.3221 OCT, Retina - OU - Both Eyes    Intravitreal Injection, Pharmacologic Agent - OS - Left Eye    Bevacizumab (AVASTIN) SOLN 1.25 mg    2. Intermediate stage nonexudative age-related macular degeneration of right eye  H35.3112     3. Both eyes affected by mild nonproliferative diabetic retinopathy with macular edema, associated with type 2 diabetes mellitus (HCC)  12/09/2022     4. Amblyopia of left eye  H53.002     5. Posterior vitreous detachment of left eye  H43.812     6. Pseudophakia of both eyes  Z96.1     7. Bilateral posterior capsular opacification  H26.493     8. Dry eyes  H04.123     9. 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, OS   - interval conversion to exudative ARMD noted on 7.26.22             - s/p IVA OS #1 (07.26.22), #2 (08.23.22), #3 (09.21.22), #4 (10.13.22),  #5 (11.16.22), #6 (12.14.22), #7 (01.11.23), #8 (02.08.23), #9 (03.22.23), #10 (06.07.23), #11 (08.02.23), #12 (10.25.23)  - BCVA 20/250 OS (stable and at amblyopic baseline) - OCT shows Persistent focal PED/CNV with interval improvement in overlying SRHM and retinal edema at 9 weeks  - recommend IVA OS #13 today, 12.29.23 w/ f/u extended to 10-11 wks  - pt wishes to be treated with IVA OS - RBA of procedure discussed, questions answered - informed consent obtained and signed - see procedure note  - f/u in 10-11 wks -- DFE/OCT  2. Intermediate Age related macular degeneration, non-exudative, OD -- stable  - exam/OCT today shows OD: stable drusen  - suspect cystic changes mostly related to DM and HTN  - BCVA OD 20/25 - stable   - FA 01.04.21 - no CNVM OU -- no exudative disease  - continue amsler grid monitoring  3. Mild non-proliferative diabetic retinopathy, both eyes - exam shows rare MA/IRH - OCT shows mild diabetic macular edema/cystic changes, both eyes  - FA 01.04.21 - late leaking, perifoveal MA OU; no NV OU - continue to monitor  4. Severe amblyopia OS  - history of patching as a child -- no strabismus surgery  - VA dropped down to 20/350 from 20/200 (baseline) due to conv to exu ARMD on 7.26.22  - now back to baseline, 20/250--OS - stable  5. PVD / vitreous syneresis OS  - Discussed findings and prognosis  - No RT or RD on 360 scleral depressed exam  - Reviewed s/s of RT/RD  - Strict return precautions for any such RT/RD signs/symptoms  6. Pseudophakia OU  - s/p CE/IOL OU by Monica Hamilton  - beautiful surgeries, doing well  - continue to monitor  7. PCO OU  - pt reports some fogginess of vision - recommend YAG Cap OU, OD first  - will return week of January 15 for YAG cap OD  8. Dry eyes OU - improving  - improved PEE on slit lamp exam.   - cont artificial tears QID and lubricating ointment qhs as needed  9. Ocular Hypertension OU  - IOP today, 24,26  - will start  Cosopt BID OU  Ophthalmic Meds Ordered this visit:  Meds ordered this encounter  Medications   dorzolamide-timolol (COSOPT) 2-0.5 % ophthalmic solution    Sig: Place 1 drop into both eyes 2 (two) times daily.    Dispense:  10 mL    Refill:  6   Bevacizumab (AVASTIN) SOLN 1.25 mg     Return for f/u week of January 15 for yag OD; 10-11 wks for DFE/OCT possible injxn.  There are no Patient Instructions on file for this visit.  This document serves as a record of services personally performed by Karie Chimera, MD, PhD. It was created on their behalf by De Blanch, an ophthalmic technician. The creation of this record is the provider's dictation and/or activities during the visit.    Electronically signed by: De Blanch, OA, 11/18/22  3:35 PM  This document serves as a record of services personally performed by Karie Chimera, MD, PhD. It was created on their behalf by Glee Arvin. Manson Passey, OA an ophthalmic technician. The creation of this record is the provider's dictation and/or activities during  the visit.    Electronically signed by: Glee Arvin. Manson Passey, [place] 12.29.2023 3:35 PM  Karie Chimera, M.D., Ph.D. Diseases & Surgery of the Retina and Vitreous [facility]  I have reviewed the above documentation for accuracy and completeness, and I agree with the above. Karie Chimera, M.D., Ph.D. 11/18/22 3:37 PM  Abbreviations: M myopia (nearsighted); A astigmatism; H hyperopia (farsighted); P presbyopia; Mrx spectacle prescription;  CTL contact lenses; OD right eye; OS left eye; OU both eyes  XT exotropia; ET esotropia; PEK punctate epithelial keratitis; PEE punctate epithelial erosions; DES dry eye syndrome; MGD meibomian gland dysfunction; ATs artificial tears; PFAT's preservative free artificial tears; NSC nuclear sclerotic cataract; PSC posterior subcapsular cataract; ERM epi-retinal membrane; PVD posterior vitreous detachment; RD retinal detachment; DM  diabetes mellitus; DR diabetic retinopathy; NPDR non-proliferative diabetic retinopathy; PDR proliferative diabetic retinopathy; CSME clinically significant macular edema; DME diabetic macular edema; dbh dot blot hemorrhages; CWS cotton wool spot; POAG primary open angle glaucoma; C/D cup-to-disc ratio; HVF humphrey visual field; GVF goldmann visual field; OCT optical coherence tomography; IOP intraocular pressure; BRVO Branch retinal vein occlusion; CRVO central retinal vein occlusion; CRAO central retinal artery occlusion; BRAO branch retinal artery occlusion; RT retinal tear; SB scleral buckle; PPV pars plana vitrectomy; VH Vitreous hemorrhage; PRP panretinal laser photocoagulation; IVK intravitreal kenalog; VMT vitreomacular traction; MH Macular hole;  NVD neovascularization of the disc; NVE neovascularization elsewhere; AREDS age related eye disease study; ARMD age related macular degeneration; POAG primary open angle glaucoma; EBMD epithelial/anterior basement membrane dystrophy; ACIOL anterior chamber intraocular lens; IOL intraocular lens; PCIOL posterior chamber intraocular lens; Phaco/IOL phacoemulsification with intraocular lens placement; PRK photorefractive keratectomy; LASIK laser assisted in situ keratomileusis; HTN hypertension; DM diabetes mellitus; COPD chronic obstructive pulmonary disease

## 2022-11-18 ENCOUNTER — Ambulatory Visit (INDEPENDENT_AMBULATORY_CARE_PROVIDER_SITE_OTHER): Payer: Medicare HMO | Admitting: Ophthalmology

## 2022-11-18 ENCOUNTER — Encounter (INDEPENDENT_AMBULATORY_CARE_PROVIDER_SITE_OTHER): Payer: Self-pay | Admitting: Ophthalmology

## 2022-11-18 DIAGNOSIS — H26493 Other secondary cataract, bilateral: Secondary | ICD-10-CM | POA: Diagnosis not present

## 2022-11-18 DIAGNOSIS — H04123 Dry eye syndrome of bilateral lacrimal glands: Secondary | ICD-10-CM

## 2022-11-18 DIAGNOSIS — E113213 Type 2 diabetes mellitus with mild nonproliferative diabetic retinopathy with macular edema, bilateral: Secondary | ICD-10-CM | POA: Diagnosis not present

## 2022-11-18 DIAGNOSIS — H53002 Unspecified amblyopia, left eye: Secondary | ICD-10-CM | POA: Diagnosis not present

## 2022-11-18 DIAGNOSIS — H43812 Vitreous degeneration, left eye: Secondary | ICD-10-CM | POA: Diagnosis not present

## 2022-11-18 DIAGNOSIS — H353221 Exudative age-related macular degeneration, left eye, with active choroidal neovascularization: Secondary | ICD-10-CM

## 2022-11-18 DIAGNOSIS — Z961 Presence of intraocular lens: Secondary | ICD-10-CM

## 2022-11-18 DIAGNOSIS — H353112 Nonexudative age-related macular degeneration, right eye, intermediate dry stage: Secondary | ICD-10-CM

## 2022-11-18 DIAGNOSIS — H40053 Ocular hypertension, bilateral: Secondary | ICD-10-CM

## 2022-11-18 MED ORDER — DORZOLAMIDE HCL-TIMOLOL MAL 2-0.5 % OP SOLN: 1.0000 [drp] | Freq: Two times a day (BID) | OPHTHALMIC | 6 refills | Status: DC

## 2022-11-18 MED ORDER — BEVACIZUMAB CHEMO INJECTION 1.25MG/0.05ML SYRINGE FOR KALEIDOSCOPE
1.2500 mg | INTRAVITREAL | Status: AC | PRN
  Administered 2022-11-18: 1.25 mg via INTRAVITREAL

## 2022-11-29 ENCOUNTER — Ambulatory Visit (HOSPITAL_COMMUNITY)
Admission: RE | Admit: 2022-11-29 | Discharge: 2022-11-29 | Disposition: A | Discharge status: Home / Self Care | Payer: Medicare HMO | Source: Ambulatory Visit | Attending: Neurosurgery | Admitting: Neurosurgery

## 2022-11-29 DIAGNOSIS — M5441 Lumbago with sciatica, right side: Secondary | ICD-10-CM | POA: Diagnosis not present

## 2022-11-29 DIAGNOSIS — M5126 Other intervertebral disc displacement, lumbar region: Secondary | ICD-10-CM | POA: Diagnosis not present

## 2022-11-29 MED ORDER — GADOBUTROL 1 MMOL/ML IV SOLN
6.5000 mL | Freq: Once | INTRAVENOUS | Status: AC | PRN
  Administered 2022-11-29: 6.5 mL via INTRAVENOUS

## 2022-11-29 NOTE — Progress Notes (Signed)
[facility] - Clinic Note  12/05/2022     CHIEF COMPLAINT Patient presents for Retina Follow Up   HISTORY OF PRESENT ILLNESS: Monica Hamilton is a 82 y.o. female who presents to the clinic today for:   HPI     Retina Follow Up   Patient presents with  Wet AMD.  This started months ago.  Duration of 2 weeks.  I, the attending physician,  performed the HPI with the patient and updated documentation appropriately.        Comments   Patient feels that the vision is the same. She is here today for a YAG. She is using Cosopt OU BID. She does not check her sugars.      Last edited by Rennis ChrisZamora, Eretria Manternach, MD on 12/05/2022  1:30 PM.     Referring physician: Self-referral -- wife of Jesusita Okaalph Leverich  HISTORICAL INFORMATION:  Selected notes from the MEDICAL RECORD NUMBER Self referral Ocular Hx: pseudophakia OU [name](Shapiro 2019) PMH: DM (on metformin), HTN, arthritis,    CURRENT MEDICATIONS: Current Outpatient Medications (Ophthalmic Drugs)  Medication Sig   dorzolamide-timolol (COSOPT) 2-0.5 % ophthalmic solution Place 1 drop into both eyes 2 (two) times daily.   No current facility-administered medications for this visit. (Ophthalmic Drugs)   Current Outpatient Medications (Other)  Medication Sig   amLODipine (NORVASC) 5 MG tablet Take 5 mg by mouth daily.   aspirin 81 MG EC tablet Take 81 mg by mouth daily.    CINNAMON PO Take 1,000 mg by mouth daily.   gabapentin (NEURONTIN) 100 MG capsule gabapentin 100 mg capsule  TAKE 1 CAPSULE BY MOUTH ONCE DAILY AT BEDTIME FOR 30 DAYS   gabapentin (NEURONTIN) 300 MG capsule Take 600 mg by mouth at bedtime.   HYDROcodone-acetaminophen (NORCO) 7.5-325 MG tablet Take 1 tablet by mouth every 6 (six) hours as needed for severe pain ((score 7 to 10)).   metFORMIN (GLUCOPHAGE) 1000 MG tablet Take 1,000 mg by mouth 2 (two) times daily.    methocarbamol (ROBAXIN) 500 MG tablet Take 1 tablet (500 mg total) by mouth every 6 (six) hours  as needed for muscle spasms.   metoprolol succinate (TOPROL-XL) 50 MG 24 hr tablet Take 50 mg by mouth daily. Take with or immediately following a meal.   MODERNA COVID-19 VACCINE 100 MCG/0.5ML injection  (Patient not taking: Reported on 12/29/2021)   Multiple Vitamins-Minerals (ICAPS AREDS 2) CAPS Take 1 capsule by mouth 2 (two) times daily.    rosuvastatin (CRESTOR) 20 MG tablet Take by mouth.   valsartan-hydrochlorothiazide (DIOVAN-HCT) 160-25 MG tablet Take 1 tablet by mouth daily.   No current facility-administered medications for this visit. (Other)   REVIEW OF SYSTEMS: ROS   Positive for: Endocrine, Cardiovascular, Eyes Last edited by Julieanne CottonShamlian, Christine N, COT on 12/05/2022  1:00 PM.     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82, led to  Mitral valve prolapse   Past Surgical History:  Procedure Laterality Date   ABDOMINAL HYSTERECTOMY     APPENDECTOMY     BLADDER SUSPENSION     BREAST BIOPSY Left    BREAST EXCISIONAL BIOPSY Left    CATARACT EXTRACTION     CHOLECYSTECTOMY     DILATION AND CURETTAGE OF UTERUS     EYE SURGERY     HEAD & NECK SKIN LESION EXCISIONAL BIOPSY     top left scalp   LUMBAR LAMINECTOMY/DECOMPRESSION MICRODISCECTOMY N/A 08/21/2019   Procedure: Lumbar decompression L3-4 right, microdiscectomy L3-4 right;  Surgeon: Ranee Gosselin, MD;  Location: [facility];  Service: Orthopedics;  Laterality: N/A;    SHOULDER ARTHROSCOPY WITH SUBACROMIAL DECOMPRESSION Right 01/30/2013    Procedure: SHOULDER ARTHROSCOPY WITH SUBACROMIAL DECOMPRESSION;  Surgeon: Drucilla Schmidt, MD;  Location: [facility];  Service: Orthopedics;  Laterality: Right;  with labral debridement   FAMILY HISTORY Family History  Problem Relation Age of Onset   Heart disease Father    Breast cancer Maternal Aunt    Diabetes Brother    Leukemia Sister    Colon cancer Neg Hx    Esophageal cancer Neg Hx    Rectal cancer Neg Hx    Stomach cancer Neg Hx    SOCIAL HISTORY Social History   Tobacco Use   Smoking status: Never   Smokeless tobacco: Never  Vaping Use   Vaping Use: Never used  Substance Use Topics   Alcohol use: No   Drug use: No       OPHTHALMIC EXAM: Base Eye Exam     Visual Acuity (Snellen - Linear)       Right Left   Dist [place] 20/40 +2 20/200   Dist ph [place] NI NI         Tonometry (Tonopen, 1:03 PM)       Right Left   Pressure 24 21         Pupils       Dark Light Shape React APD   Right 3 2 Round Brisk None   Left 3 2 Round Brisk None         Visual Fields       Left Right    Full Full         Extraocular Movement       Right Left    Full, Ortho Full, Ortho         Neuro/Psych     Oriented x3: Yes   Mood/Affect: Normal         Dilation     Both eyes: 1.0% Mydriacyl, 2.5% Phenylephrine @ 1:00 PM           Slit Lamp and Fundus Exam     Slit Lamp Exam       Right Left   Lids/Lashes Dermatochalasis - upper lid, mild Meibomian gland dysfunction Dermatochalasis - upper lid, Meibomian gland dysfunction   Conjunctiva/Sclera nasal and temporal pinguecula Nasal Pinguecula   Cornea Trace Punctate epithelial erosions, Arcus, Temporal Well healed cataract wounds, trace Debris in tear film 1+ Punctate epithelial erosions, Arcus, Temporal Well healed cataract wounds, trace Debris in tear film   Anterior Chamber Deep and quiet Deep and quiet   Iris Round and dilated Round and dilated   Lens PC IOL in good position, 1-2+ PCO PC IOL in good  position, 1-2+Posterior capsular opacification   Anterior Vitreous Vitreous syneresis, Posterior vitreous detachment, vitreous condensations Vitreous syneresis, Posterior vitreous detachment  Fundus Exam       Right Left   Disc Pink and Sharp Pink and Sharp, mild temporal Peripapillary atrophy, mild PPP   C/D Ratio 0.5 0.4   Macula Flat, Blunted foveal reflex, RPE mottling and clumping, mild atrophy, Drusen, rare MA, No edema Blunted foveal reflex, +CNV with pigment ring and surrounding edema -- improved, no heme, +drusen, RPE mottling   Vessels attenuated, Tortuous attenuated, mild tortuosity   Periphery Attached, scattered mild Reticular degeneration.  No heme. Attached, scattered Reticular degeneration, No heme.           IMAGING AND PROCEDURES  Imaging and Procedures for @TODAY @  OCT, Retina - OU - Both Eyes       Right Eye Quality was poor. Central Foveal Thickness: 24. Progression has been stable. Findings include normal foveal contour, no IRF, no SRF, myopic contour, retinal drusen , outer retinal atrophy (Patchy ORA - stable).   Left Eye Quality was good. Central Foveal Thickness: 270. Progression has been stable. Findings include no IRF, no SRF, abnormal foveal contour, retinal drusen , subretinal hyper-reflective material, intraretinal hyper-reflective material, pigment epithelial detachment, outer retinal atrophy (Persistent focal PED/CNV with interval improvement in overlying SRHM and retinal edema).   Notes *Images captured and stored on drive  Diagnosis / Impression: Mild DME OU OD: nonexudative ARMD - stable drusen OS: Exudative ARMD - Persistent focal PED/CNV with stable improvement in overlying SRHM and retinal edema -- nasal macula  Clinical management:  See below  Abbreviations: NFP - Normal foveal profile. CME - cystoid macular edema. PED - pigment epithelial detachment. IRF - intraretinal fluid. SRF - subretinal fluid. EZ - ellipsoid zone. ERM -  epiretinal membrane. ORA - outer retinal atrophy. ORT - outer retinal tubulation. SRHM - subretinal hyper-reflective material       Yag Capsulotomy - OD - Right Eye       Procedure note: YAG Capsulotomy, RIGHT Eye  Informed consent obtained. Pre-op dilating drops (1% Topicamide and 2.5% Phenylephrine), 0.2% brimonidine, and topical anesthesia given. Power: 7.9 mJ Shots: 16 Posterior capsulotomy in cruciate formation performed without difficulty. Patient tolerated procedure well. No complications. LotemaxSM 4 times a day for 7 days, then stop. Pt received written and verbal post laser education. Recheck in 2-3 weeks w/ dilated exam.            ASSESSMENT/PLAN:   ICD-10-CM   1. Exudative age-related macular degeneration of left eye with active choroidal neovascularization (HCC)  H35.3221 OCT, Retina - OU - Both Eyes    2. Intermediate stage nonexudative age-related macular degeneration of right eye  H35.3112     3. Both eyes affected by mild nonproliferative diabetic retinopathy with macular edema, associated with type 2 diabetes mellitus ([place])  [id number]     4. Amblyopia of left eye  H53.002     5. Posterior vitreous detachment of left eye  H43.812     6. Pseudophakia of both eyes  Z96.1     7. Bilateral posterior capsular opacification  H26.493 Yag Capsulotomy - OD - Right Eye    8. Dry eyes  H04.123      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, OS   - interval conversion to exudative ARMD noted on 7.26.22             - s/p IVA OS #1 (07.26.22), #2 (08.23.22), #3 (09.21.22), #4 (10.13.22), #5 (11.16.22), #6 (12.14.22), #7 (01.11.23), #8 (02.08.23), #9 (03.22.23), #10 (06.07.23), #11 (08.02.23), #12 (10.25.23), #13 (12.29.23)  - BCVA  20/200 OS (stable and at amblyopic baseline) - OCT shows Persistent focal PED/CNV with interval improvement in overlying SRHM and retinal edema at 9 weeks - IVA informed consent obtained and signed, 07.26.22 (OS) - f/u March 8 or  later -- DFE/OCT  2. Intermediate Age related macular degeneration, non-exudative, OD -- stable  - exam/OCT today shows OD: stable drusen  - suspect cystic changes mostly related to DM and HTN  - BCVA OD 20/25 - stable   - FA 01.04.21 - no CNVM OU -- no exudative disease  - continue amsler grid monitoring  3. Mild non-proliferative diabetic retinopathy, both eyes - exam shows rare MA/IRH - OCT shows mild diabetic macular edema/cystic changes, both eyes  - FA 01.04.21 - late leaking, perifoveal MA OU; no NV OU - continue to monitor  4. Severe amblyopia OS  - history of patching as a child -- no strabismus surgery  - VA dropped down to 20/350 from 20/200 (baseline) due to conv to exu ARMD on 7.26.22  - now back to baseline, 20/250--OS - stable  5. PVD / vitreous syneresis OS  - Discussed findings and prognosis  - No RT or RD on 360 scleral depressed exam  - Reviewed s/s of RT/RD  - Strict return precautions for any such RT/RD signs/symptoms  6. Pseudophakia OU  - s/p CE/IOL OU by [name]  - beautiful surgeries, doing well  - continue to monitor  7. PCO OU  - pt reports some fogginess of vision - recommend YAG Cap OD today, 01.15.24 - RBA of procedure discussed, questions answered - informed consent obtained and signed - see procedure note - start Lotemax QID OD x7 days  - f/u 2-3 weeks, DFE, OCT, possible Yag OS  8. Dry eyes OU - improving  - improved PEE on slit lamp exam.   - cont artificial tears QID and lubricating ointment qhs as needed  9. Ocular Hypertension OU  - IOP today, 24,21  - cont Cosopt BID OU  Ophthalmic Meds Ordered this visit:  No orders of the defined types were placed in this encounter.    Return for f/u 2-3 weeks, possible Yag OS / IOP check OU.  There are no Patient Instructions on file for this visit.  This document serves as a record of services personally performed by Karie Chimera, MD, PhD. It was created on their behalf by De Blanch, an ophthalmic technician. The creation of this record is the provider's dictation and/or activities during the visit.    Electronically signed by: De Blanch, OA, 12/07/22  5:26 PM  This document serves as a record of services personally performed by Karie Chimera, MD, PhD. It was created on their behalf by Glee Arvin. Manson Passey, OA an ophthalmic technician. The creation of this record is the provider's dictation and/or activities during the visit.    Electronically signed by: Glee Arvin. Manson Passey, [place] 01.15.2024 5:26 PM   Karie Chimera, M.D., Ph.D. Diseases & Surgery of the Retina and Vitreous [facility]  I have reviewed the above documentation for accuracy and completeness, and I agree with the above. Karie Chimera, M.D., Ph.D. 12/07/22 5:26 PM  Abbreviations: M myopia (nearsighted); A astigmatism; H hyperopia (farsighted); P presbyopia; Mrx spectacle prescription;  CTL contact lenses; OD right eye; OS left eye; OU both eyes  XT exotropia; ET esotropia; PEK punctate epithelial keratitis; PEE punctate epithelial erosions; DES dry eye syndrome; MGD meibomian gland dysfunction; ATs artificial tears; PFAT's  preservative free artificial tears; [place] nuclear sclerotic cataract; PSC posterior subcapsular cataract; ERM epi-retinal membrane; PVD posterior vitreous detachment; RD retinal detachment; DM diabetes mellitus; DR diabetic retinopathy; NPDR non-proliferative diabetic retinopathy; PDR proliferative diabetic retinopathy; CSME clinically significant macular edema; DME diabetic macular edema; dbh dot blot hemorrhages; CWS cotton wool spot; POAG primary open angle glaucoma; C/D cup-to-disc ratio; HVF humphrey visual field; GVF goldmann visual field; OCT optical coherence tomography; IOP intraocular pressure; BRVO Branch retinal vein occlusion; CRVO central retinal vein occlusion; CRAO central retinal artery occlusion; BRAO branch retinal artery occlusion; RT retinal tear;  SB scleral buckle; PPV pars plana vitrectomy; VH Vitreous hemorrhage; PRP panretinal laser photocoagulation; IVK intravitreal kenalog; VMT vitreomacular traction; MH Macular hole;  NVD neovascularization of the disc; NVE neovascularization elsewhere; AREDS age related eye disease study; ARMD age related macular degeneration; POAG primary open angle glaucoma; EBMD epithelial/anterior basement membrane dystrophy; ACIOL anterior chamber intraocular lens; IOL intraocular lens; PCIOL posterior chamber intraocular lens; Phaco/IOL phacoemulsification with intraocular lens placement; [place] photorefractive keratectomy; LASIK laser assisted in situ keratomileusis; HTN hypertension; DM diabetes mellitus; COPD chronic obstructive pulmonary disease

## 2022-12-01 DIAGNOSIS — M5137 Other intervertebral disc degeneration, lumbosacral region: Secondary | ICD-10-CM | POA: Diagnosis not present

## 2022-12-05 ENCOUNTER — Encounter (INDEPENDENT_AMBULATORY_CARE_PROVIDER_SITE_OTHER): Payer: Self-pay | Admitting: Ophthalmology

## 2022-12-05 ENCOUNTER — Ambulatory Visit (INDEPENDENT_AMBULATORY_CARE_PROVIDER_SITE_OTHER): Payer: Medicare HMO | Admitting: Ophthalmology

## 2022-12-05 DIAGNOSIS — E113213 Type 2 diabetes mellitus with mild nonproliferative diabetic retinopathy with macular edema, bilateral: Secondary | ICD-10-CM | POA: Diagnosis not present

## 2022-12-05 DIAGNOSIS — H26493 Other secondary cataract, bilateral: Secondary | ICD-10-CM

## 2022-12-05 DIAGNOSIS — H43812 Vitreous degeneration, left eye: Secondary | ICD-10-CM | POA: Diagnosis not present

## 2022-12-05 DIAGNOSIS — H353221 Exudative age-related macular degeneration, left eye, with active choroidal neovascularization: Secondary | ICD-10-CM | POA: Diagnosis not present

## 2022-12-05 DIAGNOSIS — H04123 Dry eye syndrome of bilateral lacrimal glands: Secondary | ICD-10-CM

## 2022-12-05 DIAGNOSIS — H353112 Nonexudative age-related macular degeneration, right eye, intermediate dry stage: Secondary | ICD-10-CM

## 2022-12-05 DIAGNOSIS — Z961 Presence of intraocular lens: Secondary | ICD-10-CM

## 2022-12-05 DIAGNOSIS — H53002 Unspecified amblyopia, left eye: Secondary | ICD-10-CM

## 2022-12-13 NOTE — Progress Notes (Shared)
[facility] Note  12/19/2022     CHIEF COMPLAINT Patient presents for Retina Follow Up   HISTORY OF PRESENT ILLNESS: Monica Hamilton is a 82 y.o. female who presents to the clinic today for:   HPI     Retina Follow Up   Patient presents with  Other.  In left eye.  This started 2 weeks ago.  I, the attending physician,  performed the HPI with the patient and updated documentation appropriately.        Comments   Patient here for 2 weeks retina follow up for Yag OS. Patient states vision was worried about it for 5 days. Couldn't see her flag in yard. But then after 5 days could see flag. No eye pain.      Last edited by [name], MD on 12/19/2022  5:36 PM.    Pt states VA improved after YAG OD.   Referring physician: Self-referral -- wife of Jacquita Mulhearn  HISTORICAL INFORMATION:  Selected notes from the MEDICAL RECORD NUMBER Self referral Ocular Hx: pseudophakia OU [name] 2019) PMH: DM (on metformin), HTN, arthritis,    CURRENT MEDICATIONS: Current Outpatient Medications (Ophthalmic Drugs)  Medication Sig   dorzolamide-timolol (COSOPT) 2-0.5 % ophthalmic solution Place 1 drop into both eyes 2 (two) times daily.   No current facility-administered medications for this visit. (Ophthalmic Drugs)   Current Outpatient Medications (Other)  Medication Sig   amLODipine (NORVASC) 5 MG tablet Take 5 mg by mouth daily.   aspirin 81 MG EC tablet Take 81 mg by mouth daily.    CINNAMON PO Take 1,000 mg by mouth daily.   gabapentin (NEURONTIN) 100 MG capsule gabapentin 100 mg capsule  TAKE 1 CAPSULE BY MOUTH ONCE DAILY AT BEDTIME FOR 30 DAYS   gabapentin (NEURONTIN) 300 MG capsule Take 600 mg by mouth at bedtime.   HYDROcodone-acetaminophen (NORCO) 7.5-325 MG tablet Take 1 tablet by mouth every 6 (six) hours as needed for severe pain ((score 7 to 10)).   metFORMIN (GLUCOPHAGE) 1000 MG tablet Take 1,000 mg by mouth 2 (two) times daily.     methocarbamol (ROBAXIN) 500 MG tablet Take 1 tablet (500 mg total) by mouth every 6 (six) hours as needed for muscle spasms.   metoprolol succinate (TOPROL-XL) 50 MG 24 hr tablet Take 50 mg by mouth daily. Take with or immediately following a meal.   Multiple Vitamins-Minerals (ICAPS AREDS 2) CAPS Take 1 capsule by mouth 2 (two) times daily.    rosuvastatin (CRESTOR) 20 MG tablet Take by mouth.   valsartan-hydrochlorothiazide (DIOVAN-HCT) 160-25 MG tablet Take 1 tablet by mouth daily.   MODERNA COVID-19 VACCINE 100 MCG/0.5ML injection  (Patient not taking: Reported on 12/29/2021)   No current facility-administered medications for this visit. (Other)   REVIEW OF SYSTEMS: ROS   Positive for: Musculoskeletal, Endocrine, Cardiovascular, Eyes Negative for: Constitutional, Gastrointestinal, Neurological, Skin, Genitourinary, HENT, Respiratory, Psychiatric, Allergic/Imm, Heme/Lymph Last edited by [name], COA on 12/19/2022 10:08 AM.     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   Past Surgical History:  Procedure Laterality Date   ABDOMINAL HYSTERECTOMY     APPENDECTOMY     BLADDER SUSPENSION     BREAST BIOPSY Left    BREAST EXCISIONAL BIOPSY Left    CATARACT EXTRACTION     CHOLECYSTECTOMY     DILATION AND CURETTAGE OF UTERUS     EYE SURGERY     HEAD & NECK SKIN LESION EXCISIONAL BIOPSY     top left scalp   LUMBAR LAMINECTOMY/DECOMPRESSION MICRODISCECTOMY N/A 08/21/2019    Procedure: Lumbar decompression L3-4 right, microdiscectomy L3-4 right;  Surgeon: [name], MD;  Location: [facility];  Service: Orthopedics;  Laterality: N/A;  61mn   SHOULDER ARTHROSCOPY WITH SUBACROMIAL DECOMPRESSION Right 01/30/2013   Procedure: SHOULDER ARTHROSCOPY WITH SUBACROMIAL DECOMPRESSION;  Surgeon: JMagnus Sinning MD;  Location: [facility];  Service: Orthopedics;  Laterality: Right;  with labral debridement   FAMILY HISTORY Family History  Problem Relation Age of Onset   Heart disease Father    Breast cancer Maternal Aunt    Diabetes Brother    Leukemia Sister    Colon cancer Neg Hx    Esophageal cancer Neg Hx    Rectal cancer Neg Hx    Stomach cancer Neg Hx    SOCIAL HISTORY Social History   Tobacco Use   Smoking status: Never   Smokeless tobacco: Never  Vaping Use   Vaping Use: Never used  Substance Use Topics   Alcohol use: No   Drug use: No       OPHTHALMIC EXAM: Base Eye Exam     Visual Acuity (Snellen - Linear)       Right Left   Dist [place] 20/40 +2 20/400 +1   Dist ph  20/30 -2 NI         Tonometry (Tonopen, 10:05 AM)       Right Left   Pressure 19 19         Pupils       Dark Light Shape React APD   Right 3 2 Round Brisk None   Left 3 2 Round Brisk None         Visual Fields (Counting fingers)       Left Right    Full Full         Extraocular Movement       Right Left    Full, Ortho Full, Ortho         Neuro/Psych     Oriented x3: Yes   Mood/Affect: Normal         Dilation     Left eye: 1.0% Mydriacyl, 2.5% Phenylephrine @ 10:05 AM  OD dilated by KJoelene Millin@ 1025AM due to changes on OCT, MS          Slit Lamp and Fundus Exam     Slit Lamp Exam       Right Left   Lids/Lashes Dermatochalasis - upper lid, mild Meibomian gland dysfunction Dermatochalasis - upper lid, Meibomian gland dysfunction   Conjunctiva/Sclera nasal and temporal pinguecula Nasal Pinguecula   Cornea Trace Punctate epithelial  erosions, Arcus, Temporal Well healed cataract wounds, trace Debris in tear film 1+ Punctate epithelial erosions, Arcus, Temporal Well healed cataract wounds, trace Debris in tear film   Anterior Chamber Deep and quiet Deep and quiet  Iris Poorly dilated Round and dilated   Lens PC IOL in good position, Open posterior capsule PC IOL in good position, 1-2+Posterior capsular opacification   Anterior Vitreous Vitreous syneresis, Posterior vitreous detachment, vitreous condensations Vitreous syneresis, Posterior vitreous detachment         Fundus Exam       Right Left   Disc Pink and Sharp Pink and Sharp, mild temporal Peripapillary atrophy, mild PPP   C/D Ratio 0.5 0.4   Macula Flat, Blunted foveal reflex, RPE mottling and clumping, mild atrophy, Drusen, rare MA, focal IRH and edema temporal mac Blunted foveal reflex, +CNV with pigment ring and surrounding edema -- improved, no heme, +drusen, RPE mottling   Vessels attenuated, Tortuous attenuated, mild tortuosity   Periphery Attached, scattered mild Reticular degeneration.  No heme. Attached, scattered Reticular degeneration, No heme.           IMAGING AND PROCEDURES  Imaging and Procedures for @TODAY @  OCT, Retina - OU - Both Eyes       Right Eye Quality was good. Central Foveal Thickness: 311. Progression has worsened. Findings include normal foveal contour, no SRF, myopic contour, retinal drusen , intraretinal fluid, outer retinal atrophy (Interval development of cystic IRF temporal macula and fovea; Patchy ORA - stable).   Left Eye Quality was good. Central Foveal Thickness: 263. Progression has been stable. Findings include no IRF, no SRF, abnormal foveal contour, retinal drusen , subretinal hyper-reflective material, intraretinal hyper-reflective material, pigment epithelial detachment, outer retinal atrophy (Persistent focal PED/CNV with interval improvement in overlying SRHM and retinal edema).   Notes *Images captured and  stored on drive  Diagnosis / Impression: Mild DME OU OD: nonexudative ARMD; Interval development of cystic IRF temporal macula and fovea -- +DME; Patchy ORA - stable OS: Exudative ARMD - Persistent focal PED/CNV with stable improvement in overlying SRHM and retinal edema -- nasal macula  Clinical management:  See below  Abbreviations: NFP - Normal foveal profile. CME - cystoid macular edema. PED - pigment epithelial detachment. IRF - intraretinal fluid. SRF - subretinal fluid. EZ - ellipsoid zone. ERM - epiretinal membrane. ORA - outer retinal atrophy. ORT - outer retinal tubulation. SRHM - subretinal hyper-reflective material       Intravitreal Injection, Pharmacologic Agent - OD - Right Eye       Time Out 12/19/2022. 12:10 PM. Confirmed correct patient, procedure, site, and patient consented.   Anesthesia Topical anesthesia was used. Anesthetic medications included Lidocaine 4%, Proparacaine 0.5%.   Procedure Preparation included 5% betadine to ocular surface, eyelid speculum. A supplied needle was used.   Injection: 1.25 mg Bevacizumab 1.67m/0.05ml   Route: Intravitreal, Site: Right Eye   NDC: 50242-060-01, Lot:: [id number] Expiration date: 02/11/2023   Post-op Post injection exam found visual acuity of at least counting fingers. The patient tolerated the procedure well. There were no complications. The patient received written and verbal post procedure care education. Post injection medications were not given.            ASSESSMENT/PLAN:   ICD-10-CM   1. Exudative age-related macular degeneration of left eye with active choroidal neovascularization (HCC)  H35.3221 OCT, Retina - OU - Both Eyes    2. Intermediate stage nonexudative age-related macular degeneration of right eye  H35.3112     3. Both eyes affected by mild nonproliferative diabetic retinopathy with macular edema, associated with type 2 diabetes mellitus (HCC)  EW97.9480OCT, Retina - OU - Both Eyes     Intravitreal Injection, Pharmacologic Agent -  OD - Right Eye    Bevacizumab (AVASTIN) SOLN 1.25 mg    4. Amblyopia of left eye  H53.002     5. Posterior vitreous detachment of left eye  H43.812     6. Pseudophakia of both eyes  Z96.1     7. Bilateral posterior capsular opacification  H26.493     8. Dry eyes  H04.123     9. 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, OS   - interval conversion to exudative ARMD noted on 7.26.22             - s/p IVA OS #1 (07.26.22), #2 (08.23.22), #3 (09.21.22), #4 (10.13.22), #5 (11.16.22), #6 (12.14.22), #7 (01.11.23), #8 (02.08.23), #9 (03.22.23), #10 (06.07.23), #11 (08.02.23), #12 (10.25.23), #13 (12.29.23)  - BCVA 20/200 OS (stable and at amblyopic baseline) - OCT shows Persistent focal PED/CNV with stable improvement in overlying SRHM and retinal edema -- nasal macula at 4 weeks - currently on a q9 wk schedule -- not due for injection today - IVA informed consent obtained and signed, 07.26.22 (OS) - f/u March 8 or later -- DFE/OCT  2. Intermediate Age related macular degeneration, non-exudative, OD -- stable  - exam/OCT today shows OD: Interval development of cystic IRF temporal macula and fovea; Patchy ORA - stable  - suspect cystic changes mostly related to DM and HTN  - BCVA OD 20/25 - stable   - FA 01.04.21 - no CNVM OU -- no exudative disease  - continue amsler grid monitoring  3. Mild non-proliferative diabetic retinopathy, both eyes - exam shows rare MA/IRH - OCT OD today 01.29.24 shows Interval development of cystic IRF temporal macula and fovea; Patchy ORA - stable  - FA 01.04.21 - late leaking, perifoveal MA OU; no NV OU  - recommend IVA OD #1 today, 01.29.24 for DME  - IVA informed consent obtained and signed 01.29.24  - Pt wishes to proceed w/ injection.  - f/u 4 wks for DFE, OCT, likely injection OD.   4. Severe amblyopia OS  - history of patching as a child -- no strabismus  surgery  - VA dropped down to 20/350 from 20/200 (baseline) due to conv to exu ARMD on 7.26.22  5. PVD / vitreous syneresis OS  - Discussed findings and prognosis  - No RT or RD on 360 scleral depressed exam  - Reviewed s/s of RT/RD  - Strict return precautions for any such RT/RD signs/symptoms  6. Pseudophakia OU  - s/p CE/IOL OU by [name]  - beautiful surgeries, doing well  - continue to monitor  7. PCO OU  - pt reports some fogginess of vision - s/p YAG Cap OD (01.15.24) -- good opening -- BCVA improved to 20/30 from 20/40 - recommend Yag Cap OS at some point.   - f/u March 8 or later  8. Dry eyes OU  - persistnet PEE on slit lamp exam.   - cont artificial tears QID and lubricating ointment qhs as needed  9. Ocular Hypertension OU  - IOP today, 19 OU (improved)  - cont Cosopt BID OU  Ophthalmic Meds Ordered this visit:  Meds ordered this encounter  Medications   Bevacizumab (AVASTIN) SOLN 1.25 mg     Return in about 4 weeks (around 01/16/2023) for DME OD; exudativeaARMD OS -- Dilated Exam, OCT, Possible Injxn.  There are no Patient Instructions on file for this visit.  This document serves as a record of  services personally performed by Gardiner Sleeper, MD, PhD. It was created on their behalf by San Jetty. Owens Shark, OA an ophthalmic technician. The creation of this record is the provider's dictation and/or activities during the visit.    Electronically signed by: San Jetty. Owens Shark, [place] 01.23.2024 12:21 AM  This document serves as a record of services personally performed by Gardiner Sleeper, MD, PhD. It was created on their behalf by Orvan Falconer, an ophthalmic technician. The creation of this record is the provider's dictation and/or activities during the visit.    Electronically signed by: Orvan Falconer, OA, 12/20/22  12:21 AM  Gardiner Sleeper, M.D., Ph.D. Diseases & Surgery of the Retina and Vitreous [facility]  I have reviewed the above  documentation for accuracy and completeness, and I agree with the above. Gardiner Sleeper, M.D., Ph.D. 12/20/22 12:21 AM   Abbreviations: M myopia (nearsighted); A astigmatism; H hyperopia (farsighted); P presbyopia; Mrx spectacle prescription;  CTL contact lenses; OD right eye; OS left eye; OU both eyes  XT exotropia; ET esotropia; PEK punctate epithelial keratitis; PEE punctate epithelial erosions; DES dry eye syndrome; MGD meibomian gland dysfunction; ATs artificial tears; PFAT's preservative free artificial tears; Haakon nuclear sclerotic cataract; PSC posterior subcapsular cataract; ERM epi-retinal membrane; PVD posterior vitreous detachment; RD retinal detachment; DM diabetes mellitus; DR diabetic retinopathy; NPDR non-proliferative diabetic retinopathy; PDR proliferative diabetic retinopathy; CSME clinically significant macular edema; DME diabetic macular edema; dbh dot blot hemorrhages; CWS cotton wool spot; POAG primary open angle glaucoma; C/D cup-to-disc ratio; HVF humphrey visual field; GVF goldmann visual field; OCT optical coherence tomography; IOP intraocular pressure; BRVO Branch retinal vein occlusion; CRVO central retinal vein occlusion; CRAO central retinal artery occlusion; BRAO branch retinal artery occlusion; RT retinal tear; SB scleral buckle; PPV pars plana vitrectomy; VH Vitreous hemorrhage; PRP panretinal laser photocoagulation; IVK intravitreal kenalog; VMT vitreomacular traction; MH Macular hole;  NVD neovascularization of the disc; NVE neovascularization elsewhere; AREDS age related eye disease study; ARMD age related macular degeneration; POAG primary open angle glaucoma; EBMD epithelial/anterior basement membrane dystrophy; ACIOL anterior chamber intraocular lens; IOL intraocular lens; PCIOL posterior chamber intraocular lens; Phaco/IOL phacoemulsification with intraocular lens placement; Ama photorefractive keratectomy; LASIK laser assisted in situ keratomileusis; HTN hypertension; DM  diabetes mellitus; COPD chronic obstructive pulmonary disease

## 2022-12-19 ENCOUNTER — Ambulatory Visit (INDEPENDENT_AMBULATORY_CARE_PROVIDER_SITE_OTHER): Payer: Medicare HMO | Admitting: Ophthalmology

## 2022-12-19 ENCOUNTER — Encounter (INDEPENDENT_AMBULATORY_CARE_PROVIDER_SITE_OTHER): Payer: Self-pay | Admitting: Ophthalmology

## 2022-12-19 DIAGNOSIS — H26493 Other secondary cataract, bilateral: Secondary | ICD-10-CM

## 2022-12-19 DIAGNOSIS — H40053 Ocular hypertension, bilateral: Secondary | ICD-10-CM

## 2022-12-19 DIAGNOSIS — H43812 Vitreous degeneration, left eye: Secondary | ICD-10-CM

## 2022-12-19 DIAGNOSIS — H353112 Nonexudative age-related macular degeneration, right eye, intermediate dry stage: Secondary | ICD-10-CM | POA: Diagnosis not present

## 2022-12-19 DIAGNOSIS — H353221 Exudative age-related macular degeneration, left eye, with active choroidal neovascularization: Secondary | ICD-10-CM | POA: Diagnosis not present

## 2022-12-19 DIAGNOSIS — H04123 Dry eye syndrome of bilateral lacrimal glands: Secondary | ICD-10-CM

## 2022-12-19 DIAGNOSIS — E113213 Type 2 diabetes mellitus with mild nonproliferative diabetic retinopathy with macular edema, bilateral: Secondary | ICD-10-CM | POA: Diagnosis not present

## 2022-12-19 DIAGNOSIS — Z961 Presence of intraocular lens: Secondary | ICD-10-CM

## 2022-12-19 DIAGNOSIS — H53002 Unspecified amblyopia, left eye: Secondary | ICD-10-CM

## 2022-12-19 MED ORDER — BEVACIZUMAB CHEMO INJECTION 1.25MG/0.05ML SYRINGE FOR KALEIDOSCOPE
1.2500 mg | INTRAVITREAL | Status: AC | PRN
  Administered 2022-12-19: 1.25 mg via INTRAVITREAL

## 2023-01-09 NOTE — Progress Notes (Signed)
[facility] Note  01/16/2023     CHIEF COMPLAINT Patient presents for Retina Follow Up   HISTORY OF PRESENT ILLNESS: Monica Hamilton is a 82 y.o. female who presents to the clinic today for:   HPI     Retina Follow Up   Patient presents with  Diabetic Retinopathy.  In both eyes.  This started 4 weeks ago.  Duration of 4 weeks.  Since onset it is gradually worsening.  I, the attending physician,  performed the HPI with the patient and updated documentation appropriately.        Comments   4 week retina follow up NPDR OU and IVA OD pt is reporting that her vision on the right side of OD is little blurred she is not currently checking her blood sugar at this time       Last edited by [name], MD on 01/16/2023  4:31 PM.    Pt states her vision seems worse, she has been unable to work on her puzzles in over 3 weeks  Referring physician: Self-referral -- wife of Man Poyser  HISTORICAL INFORMATION:  Selected notes from the [place] referral Ocular Hx: pseudophakia OU Monica Hamilton 2019) PMH: DM (on metformin), HTN, arthritis,    CURRENT MEDICATIONS: Current Outpatient Medications (Ophthalmic Drugs)  Medication Sig   dorzolamide-timolol (COSOPT) 2-0.5 % ophthalmic solution Place 1 drop into both eyes 2 (two) times daily.   No current facility-administered medications for this visit. (Ophthalmic Drugs)   Current Outpatient Medications (Other)  Medication Sig   amLODipine (NORVASC) 5 MG tablet Take 5 mg by mouth daily.   aspirin 81 MG EC tablet Take 81 mg by mouth daily.    CINNAMON PO Take 1,000 mg by mouth daily.   gabapentin (NEURONTIN) 100 MG capsule gabapentin 100 mg capsule  TAKE 1 CAPSULE BY MOUTH ONCE DAILY AT BEDTIME FOR 30 DAYS   gabapentin (NEURONTIN) 300 MG capsule Take 600 mg by mouth at bedtime.   HYDROcodone-acetaminophen (NORCO) 7.5-325 MG tablet Take 1 tablet by mouth every 6 (six) hours as needed for severe  pain ((score 7 to 10)).   metFORMIN (GLUCOPHAGE) 1000 MG tablet Take 1,000 mg by mouth 2 (two) times daily.    methocarbamol (ROBAXIN) 500 MG tablet Take 1 tablet (500 mg total) by mouth every 6 (six) hours as needed for muscle spasms.   metoprolol succinate (TOPROL-XL) 50 MG 24 hr tablet Take 50 mg by mouth daily. Take with or immediately following a meal.   MODERNA COVID-19 VACCINE 100 MCG/0.5ML injection  (Patient not taking: Reported on 12/29/2021)   Multiple Vitamins-Minerals (ICAPS AREDS 2) CAPS Take 1 capsule by mouth 2 (two) times daily.    rosuvastatin (CRESTOR) 20 MG tablet Take by mouth.   valsartan-hydrochlorothiazide (DIOVAN-HCT) 160-25 MG tablet Take 1 tablet by mouth daily.   No current facility-administered medications for this visit. (Other)   REVIEW OF SYSTEMS: ROS   Positive for: Musculoskeletal, Endocrine, Cardiovascular, Eyes Negative for: Constitutional, Gastrointestinal, Neurological, Skin, Genitourinary, HENT, Respiratory, Psychiatric, Allergic/Imm, Heme/Lymph Last edited by Parthenia Ames, COT on 01/16/2023  1:17 PM.     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 prolapse   Past Surgical History:  Procedure Laterality Date   ABDOMINAL HYSTERECTOMY     APPENDECTOMY     BLADDER SUSPENSION     BREAST BIOPSY Left    BREAST EXCISIONAL BIOPSY Left    CATARACT EXTRACTION     CHOLECYSTECTOMY     DILATION AND CURETTAGE OF UTERUS     EYE SURGERY     HEAD & NECK  SKIN LESION EXCISIONAL BIOPSY     top left scalp   LUMBAR LAMINECTOMY/DECOMPRESSION MICRODISCECTOMY N/A 08/21/2019   Procedure: Lumbar decompression L3-4 right, microdiscectomy L3-4 right;  Surgeon: [name], MD;  Location: [facility];  Service: Orthopedics;  Laterality: N/A;  67mn   SHOULDER ARTHROSCOPY WITH SUBACROMIAL DECOMPRESSION Right 01/30/2013   Procedure: SHOULDER ARTHROSCOPY WITH SUBACROMIAL DECOMPRESSION;  Surgeon: JMagnus Sinning MD;  Location: [facility];  Service: Orthopedics;  Laterality: Right;  with labral debridement   FAMILY HISTORY Family History  Problem Relation Age of Onset   Heart disease Father    Breast cancer Maternal Aunt    Diabetes Brother    Leukemia Sister    Colon cancer Neg Hx    Esophageal cancer Neg Hx    Rectal cancer Neg Hx    Stomach cancer Neg Hx    SOCIAL HISTORY Social History   Tobacco Use   Smoking status: Never   Smokeless tobacco: Never  Vaping Use   Vaping Use: Never used  Substance Use Topics   Alcohol use: No   Drug use: No       OPHTHALMIC EXAM: Base Eye Exam     Visual Acuity (Snellen - Linear)       Right Left   Dist [place] 20/40 -3 20/200 -2   Dist ph [place] NI NI         Tonometry (Tonopen, 1:20 PM)       Right Left   Pressure 20 22         Pupils       Pupils Dark Light Shape React APD   Right PERRL 3 2 Round Brisk None   Left PERRL 3 2 Round Brisk None         Visual Fields       Left Right    Full Full         Extraocular Movement       Right Left    Full, Ortho Full, Ortho         Neuro/Psych     Oriented x3: Yes   Mood/Affect: Normal         Dilation     Both eyes: 2.5% Phenylephrine @ 1:20 PM           Slit Lamp and Fundus Exam     Slit Lamp Exam       Right Left   Lids/Lashes Dermatochalasis - upper lid, mild Meibomian gland dysfunction Dermatochalasis - upper lid, Meibomian gland dysfunction   Conjunctiva/Sclera nasal and temporal pinguecula Nasal Pinguecula    Cornea tear film debris, trace PEE, mild arcus, well healed cataract wound 1+ Punctate epithelial erosions, Arcus, Temporal Well healed cataract wounds, trace Debris in tear film  Anterior Chamber Deep and quiet Deep and quiet   Iris Poorly dilated Round and dilated   Lens PC IOL in good position, Open posterior capsule PC IOL in good position, 1-2+Posterior capsular opacification   Anterior Vitreous Vitreous syneresis, Posterior vitreous detachment, vitreous condensations Vitreous syneresis, Posterior vitreous detachment         Fundus Exam       Right Left   Disc Pink and Sharp Pink and Sharp, mild temporal Peripapillary atrophy, mild PPP   C/D Ratio 0.5 0.4   Macula Flat, Blunted foveal reflex, RPE mottling and clumping, mild atrophy, Drusen, rare MA, focal IRH and edema temporal mac -- improved Blunted foveal reflex, +CNV with pigment ring and surrounding edema -- improved, no heme, +drusen, RPE mottling   Vessels attenuated, mild tortuosity attenuated, mild tortuosity   Periphery Attached, scattered mild Reticular degeneration.  No heme. Attached, scattered Reticular degeneration, No heme.           IMAGING AND PROCEDURES  Imaging and Procedures for '@TODAY'$ @  OCT, Retina - OU - Both Eyes       Right Eye Quality was good. Central Foveal Thickness: 311. Progression has improved. Findings include normal foveal contour, no SRF, myopic contour, retinal drusen , intraretinal fluid, outer retinal atrophy (Interval improvement in IRF temporal macula and fovea; Patchy ORA - stable).   Left Eye Quality was good. Central Foveal Thickness: 268. Progression has been stable. Findings include no IRF, no SRF, abnormal foveal contour, retinal drusen , subretinal hyper-reflective material, intraretinal hyper-reflective material, pigment epithelial detachment, outer retinal atrophy (Persistent focal PED/CNV with stable improvement in overlying SRHM and retinal edema).   Notes *Images captured  and stored on drive  Diagnosis / Impression: Mild DME OU OD: nonexudative ARMD; Interval improvement in IRF temporal macula and fovea; Patchy ORA - stable OS: Exudative ARMD - Persistent focal PED/CNV with stable improvement in overlying SRHM and retinal edema -- nasal macula  Clinical management:  See below  Abbreviations: NFP - Normal foveal profile. CME - cystoid macular edema. PED - pigment epithelial detachment. IRF - intraretinal fluid. SRF - subretinal fluid. EZ - ellipsoid zone. ERM - epiretinal membrane. ORA - outer retinal atrophy. ORT - outer retinal tubulation. SRHM - subretinal hyper-reflective material       Intravitreal Injection, Pharmacologic Agent - OD - Right Eye       Time Out 01/16/2023. 2:07 PM. Confirmed correct patient, procedure, site, and patient consented.   Anesthesia Topical anesthesia was used. Anesthetic medications included Lidocaine 4%, Proparacaine 0.5%.   Procedure Preparation included 5% betadine to ocular surface, eyelid speculum. A supplied needle was used.   Injection: 1.25 mg Bevacizumab 1.'25mg'$ /0.50m   Route: Intravitreal, Site: Right Eye   NDC: [id number] Expiration date: 03/05/2023   Post-op Post injection exam found visual acuity of at least counting fingers. The patient tolerated the procedure well. There were no complications. The patient received written and verbal post procedure care education. Post injection medications were not given.            ASSESSMENT/PLAN:   ICD-10-CM   1. Exudative age-related macular degeneration of left eye with active choroidal neovascularization (HCC)  H35.3221 OCT, Retina - OU - Both Eyes    2. Intermediate stage nonexudative age-related macular degeneration of right eye  H35.3112     3. Both eyes affected by mild nonproliferative diabetic retinopathy with macular edema, associated with type 2 diabetes mellitus (HCC)  [id number] Injection, Pharmacologic Agent - OD -  Right Eye    Bevacizumab (AVASTIN) SOLN 1.25 mg    4. Amblyopia of left eye  H53.002     5. Posterior vitreous detachment of left eye  H43.812     6. Pseudophakia of both eyes  Z96.1     7. Bilateral posterior capsular opacification  H26.493     8. Dry eyes  H04.123     9. 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, OS   - interval conversion to exudative ARMD noted on 7.26.22             - s/p IVA OS #1 (07.26.22), #2 (08.23.22), #3 (09.21.22), #4 (10.13.22), #5 (11.16.22), #6 (12.14.22), #7 (01.11.23), #8 (02.08.23), #9 (03.22.23), #10 (06.07.23), #11 (08.02.23), #12 (10.25.23), #13 (12.29.23)  - BCVA 20/200 OS (stable and at amblyopic baseline) - OCT shows Persistent focal PED/CNV with stable improvement in overlying SRHM and retinal edema -- nasal macula at 4 weeks - currently on a q9 wk schedule -- due around Mar. 1st - IVA informed consent obtained and signed, 07.26.22 (OS) - f/u in 4 wks, ~March 25 for DFE/OCT, possible injxn -- will coordinate with IVA OD (see below)  2. Intermediate Age related macular degeneration, non-exudative, OD -- stable  - exam/OCT today shows OD: Interval development of cystic IRF temporal macula and fovea; Patchy ORA - stable  - suspect cystic changes mostly related to DM and HTN  - BCVA OD 20/25 - stable   - FA 01.04.21 - no CNVM OU -- no exudative disease  - continue amsler grid monitoring  3. Mild non-proliferative diabetic retinopathy, both eyes - s/p IVA OD #1 (01.29.24) - exam shows rare MA/IRH - FA 01.04.21 - late leaking, perifoveal MA OU; no NV OU - OCT OD shows Interval improvement in IRF temporal macula and fovea; Patchy ORA - stable  - recommend IVA OD #2 today, 02.26.24 for DME  - Pt wishes to proceed w/ injection - RBA of procedure discussed, questions answered - IVA informed consent obtained and signed 01.29.24 - see procedure note  - f/u 4 wks for DFE, OCT, likely injection  OD.   4. Severe amblyopia OS  - history of patching as a child -- no strabismus surgery  - VA dropped down to 20/350 from 20/200 (baseline) due to conv to exu ARMD on 7.26.22  5. PVD / vitreous syneresis OS  - Discussed findings and prognosis  - No RT or RD on 360 scleral depressed exam  - Reviewed s/s of RT/RD  - Strict return precautions for any such RT/RD signs/symptoms  6. Pseudophakia OU  - s/p CE/IOL OU by Monica Hamilton  - beautiful surgeries, doing well  - continue to monitor  7. PCO OU  - pt reports some fogginess of vision - s/p YAG Cap OD (01.15.24) -- good opening -- BCVA improved to 20/30 from 20/40 - recommend Yag Cap OS at some point.   - f/u March 8 or later  8. Dry eyes OU  - persistnet PEE on slit lamp exam.   - cont artificial tears QID and lubricating ointment qhs as needed  9. Ocular Hypertension OU  - IOP today, 20,22   - cont Cosopt BID OU  Ophthalmic Meds Ordered this visit:  Meds ordered this encounter  Medications   Bevacizumab (AVASTIN) SOLN 1.25 mg     Return in about 4 weeks (around 02/13/2023) for f/u exu ARMD OU, DFE, OCT.  There are  no Patient Instructions on file for this visit.  This document serves as a record of services personally performed by Gardiner Sleeper, MD, PhD. It was created on their behalf by Orvan Falconer, an ophthalmic technician. The creation of this record is the provider's dictation and/or activities during the visit.    Electronically signed by: Orvan Falconer, OA, 01/16/23  4:33 PM  This document serves as a record of services personally performed by Gardiner Sleeper, MD, PhD. It was created on their behalf by San Jetty. Owens Shark, OA an ophthalmic technician. The creation of this record is the provider's dictation and/or activities during the visit.    Electronically signed by: San Jetty. Owens Shark, [place] 02.26.2024 4:33 PM  Gardiner Sleeper, M.D., Ph.D. Diseases & Surgery of the Retina and Vitreous [facility]  I have reviewed the above documentation for accuracy and completeness, and I agree with the above. Gardiner Sleeper, M.D., Ph.D. 01/16/23 4:39 PM  Abbreviations: M myopia (nearsighted); A astigmatism; H hyperopia (farsighted); P presbyopia; Mrx spectacle prescription;  CTL contact lenses; OD right eye; OS left eye; OU both eyes  XT exotropia; ET esotropia; PEK punctate epithelial keratitis; PEE punctate epithelial erosions; DES dry eye syndrome; MGD meibomian gland dysfunction; ATs artificial tears; PFAT's preservative free artificial tears; [place] nuclear sclerotic cataract; PSC posterior subcapsular cataract; ERM epi-retinal membrane; PVD posterior vitreous detachment; RD retinal detachment; DM diabetes mellitus; DR diabetic retinopathy; NPDR non-proliferative diabetic retinopathy; PDR proliferative diabetic retinopathy; CSME clinically significant macular edema; DME diabetic macular edema; dbh dot blot hemorrhages; CWS cotton wool spot; POAG primary open angle glaucoma; C/D cup-to-disc ratio; HVF humphrey visual field; GVF goldmann visual field; OCT optical coherence tomography; IOP intraocular pressure; BRVO Branch retinal vein occlusion; CRVO central retinal vein occlusion; CRAO central retinal artery occlusion; BRAO branch retinal artery occlusion; RT retinal tear; SB scleral buckle; PPV pars plana vitrectomy; VH Vitreous hemorrhage; PRP panretinal laser photocoagulation; IVK intravitreal kenalog; VMT vitreomacular traction; MH Macular hole;  NVD neovascularization of the disc; NVE neovascularization elsewhere; AREDS age related eye disease study; ARMD age related macular degeneration; POAG primary open angle glaucoma; EBMD epithelial/anterior basement membrane dystrophy; ACIOL anterior chamber intraocular lens; IOL intraocular lens; PCIOL posterior chamber intraocular lens; Phaco/IOL phacoemulsification with intraocular lens placement; [place] photorefractive keratectomy; LASIK laser assisted in situ  keratomileusis; HTN hypertension; DM diabetes mellitus; COPD chronic obstructive pulmonary disease

## 2023-01-16 ENCOUNTER — Ambulatory Visit (INDEPENDENT_AMBULATORY_CARE_PROVIDER_SITE_OTHER): Payer: Medicare HMO | Admitting: Ophthalmology

## 2023-01-16 ENCOUNTER — Encounter (INDEPENDENT_AMBULATORY_CARE_PROVIDER_SITE_OTHER): Payer: Self-pay | Admitting: Ophthalmology

## 2023-01-16 DIAGNOSIS — E113213 Type 2 diabetes mellitus with mild nonproliferative diabetic retinopathy with macular edema, bilateral: Secondary | ICD-10-CM | POA: Diagnosis not present

## 2023-01-16 DIAGNOSIS — H40053 Ocular hypertension, bilateral: Secondary | ICD-10-CM

## 2023-01-16 DIAGNOSIS — Z961 Presence of intraocular lens: Secondary | ICD-10-CM

## 2023-01-16 DIAGNOSIS — H353112 Nonexudative age-related macular degeneration, right eye, intermediate dry stage: Secondary | ICD-10-CM

## 2023-01-16 DIAGNOSIS — H353221 Exudative age-related macular degeneration, left eye, with active choroidal neovascularization: Secondary | ICD-10-CM

## 2023-01-16 DIAGNOSIS — H26493 Other secondary cataract, bilateral: Secondary | ICD-10-CM

## 2023-01-16 DIAGNOSIS — H43812 Vitreous degeneration, left eye: Secondary | ICD-10-CM | POA: Diagnosis not present

## 2023-01-16 DIAGNOSIS — H53002 Unspecified amblyopia, left eye: Secondary | ICD-10-CM

## 2023-01-16 DIAGNOSIS — H04123 Dry eye syndrome of bilateral lacrimal glands: Secondary | ICD-10-CM

## 2023-01-16 MED ORDER — BEVACIZUMAB CHEMO INJECTION 1.25MG/0.05ML SYRINGE FOR KALEIDOSCOPE
1.2500 mg | INTRAVITREAL | Status: AC | PRN
  Administered 2023-01-16: 1.25 mg via INTRAVITREAL

## 2023-02-03 ENCOUNTER — Encounter (INDEPENDENT_AMBULATORY_CARE_PROVIDER_SITE_OTHER): Payer: Medicare HMO | Admitting: Ophthalmology

## 2023-02-03 NOTE — Progress Notes (Signed)
[facility] Note  02/13/2023     CHIEF COMPLAINT Patient presents for Retina Follow Up   HISTORY OF PRESENT ILLNESS: Monica Hamilton is a 82 y.o. female who presents to the clinic today for:   HPI     Retina Follow Up   Patient presents with  Wet AMD.  In both eyes.  This started 4 weeks ago.  I, the attending physician,  performed the HPI with the patient and updated documentation appropriately.        Comments   Patient here for 4 weeks retina follow up for  exu ARMD OU. Patient states vision still hard to see real small print that could read before the laser. Still can see to work puzzles. Still has floaters. They are weird. Circles black with silver middles.       Last edited by [name], MD on 02/13/2023  2:02 PM.    Pt states she has persistent black floaters in the right eye  Referring physician: Self-referral -- wife of Monica Hamilton  HISTORICAL INFORMATION:  Selected notes from the [place] referral Ocular Hx: pseudophakia OU Monica Hamilton 2019) PMH: DM (on metformin), HTN, arthritis,    CURRENT MEDICATIONS: Current Outpatient Medications (Ophthalmic Drugs)  Medication Sig   dorzolamide-timolol (COSOPT) 2-0.5 % ophthalmic solution Place 1 drop into both eyes 2 (two) times daily.   No current facility-administered medications for this visit. (Ophthalmic Drugs)   Current Outpatient Medications (Other)  Medication Sig   amLODipine (NORVASC) 5 MG tablet Take 5 mg by mouth daily.   aspirin 81 MG EC tablet Take 81 mg by mouth daily.    CINNAMON PO Take 1,000 mg by mouth daily.   gabapentin (NEURONTIN) 100 MG capsule gabapentin 100 mg capsule  TAKE 1 CAPSULE BY MOUTH ONCE DAILY AT BEDTIME FOR 30 DAYS   gabapentin (NEURONTIN) 300 MG capsule Take 600 mg by mouth at bedtime.   HYDROcodone-acetaminophen (NORCO) 7.5-325 MG tablet Take 1 tablet by mouth every 6 (six) hours as needed for severe pain ((score 7 to 10)).    metFORMIN (GLUCOPHAGE) 1000 MG tablet Take 1,000 mg by mouth 2 (two) times daily.    methocarbamol (ROBAXIN) 500 MG tablet Take 1 tablet (500 mg total) by mouth every 6 (six) hours as needed for muscle spasms.   metoprolol succinate (TOPROL-XL) 50 MG 24 hr tablet Take 50 mg by mouth daily. Take with or immediately following a meal.   Multiple Vitamins-Minerals (ICAPS AREDS 2) CAPS Take 1 capsule by mouth 2 (two) times daily.    rosuvastatin (CRESTOR) 20 MG tablet Take by mouth.   valsartan-hydrochlorothiazide (DIOVAN-HCT) 160-25 MG tablet Take 1 tablet by mouth daily.   MODERNA COVID-19 VACCINE 100 MCG/0.5ML injection  (Patient not taking: Reported on 12/29/2021)   No current facility-administered medications for this visit. (Other)   REVIEW OF SYSTEMS: ROS   Positive for: Musculoskeletal, Endocrine, Cardiovascular, Eyes Negative for: Constitutional, Gastrointestinal, Neurological, Skin, Genitourinary, HENT, Respiratory, Psychiatric, Allergic/Imm, Heme/Lymph Last edited by [name], COA on 02/13/2023  1:20 PM.      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   Past Surgical History:  Procedure Laterality Date   ABDOMINAL HYSTERECTOMY     APPENDECTOMY     BLADDER SUSPENSION     BREAST BIOPSY Left    BREAST EXCISIONAL BIOPSY Left    CATARACT EXTRACTION     CHOLECYSTECTOMY     DILATION AND CURETTAGE OF UTERUS     EYE SURGERY     HEAD & NECK SKIN LESION EXCISIONAL BIOPSY      top left scalp   LUMBAR LAMINECTOMY/DECOMPRESSION MICRODISCECTOMY N/A 08/21/2019   Procedure: Lumbar decompression L3-4 right, microdiscectomy L3-4 right;  Surgeon: [name], MD;  Location: [facility];  Service: Orthopedics;  Laterality: N/A;  36min   SHOULDER ARTHROSCOPY WITH SUBACROMIAL DECOMPRESSION Right 01/30/2013   Procedure: SHOULDER ARTHROSCOPY WITH SUBACROMIAL DECOMPRESSION;  Surgeon: Magnus Sinning, MD;  Location: [facility];  Service: Orthopedics;  Laterality: Right;  with labral debridement   FAMILY HISTORY Family History  Problem Relation Age of Onset   Heart disease Father    Breast cancer Maternal Aunt    Diabetes Brother    Leukemia Sister    Colon cancer Neg Hx    Esophageal cancer Neg Hx    Rectal cancer Neg Hx    Stomach cancer Neg Hx    SOCIAL HISTORY Social History   Tobacco Use   Smoking status: Never   Smokeless tobacco: Never  Vaping Use   Vaping Use: Never used  Substance Use Topics   Alcohol use: No   Drug use: No       OPHTHALMIC EXAM: Base Eye Exam     Visual Acuity (Snellen - Linear)       Right Left   Dist [place] 20/30 -1 20/250 +2   Dist ph [place] NI NI         Tonometry (Tonopen, 1:16 PM)       Right Left   Pressure 18 20         Pupils       Dark Light Shape React APD   Right 3 2 Round Brisk None   Left 3 2 Round Brisk None         Visual Fields (Counting fingers)       Left Right    Full Full         Extraocular Movement       Right Left    Full, Ortho Full, Ortho         Neuro/Psych     Oriented x3: Yes   Mood/Affect: Normal         Dilation     Both eyes: 1.0% Mydriacyl, 2.5% Phenylephrine @ 1:16 PM           Slit Lamp and Fundus Exam     Slit Lamp Exam       Right Left   Lids/Lashes Dermatochalasis - upper lid, mild Meibomian gland dysfunction Dermatochalasis - upper lid, Meibomian gland dysfunction   Conjunctiva/Sclera nasal and temporal pinguecula Nasal Pinguecula   Cornea tear  film debris, trace PEE, mild arcus, well healed cataract wound 1+ Punctate epithelial erosions, Arcus, Temporal Well healed cataract wounds, trace Debris in tear film   Anterior Chamber Deep and quiet Deep and quiet   Iris  Poorly dilated Round and dilated   Lens PC IOL in good position, Open posterior capsule PC IOL in good position, 1-2+Posterior capsular opacification   Anterior Vitreous Vitreous syneresis, Posterior vitreous detachment, vitreous condensations Vitreous syneresis, Posterior vitreous detachment         Fundus Exam       Right Left   Disc Pink and Sharp Pink and Sharp, mild temporal Peripapillary atrophy, mild PPP   C/D Ratio 0.5 0.4   Macula Flat, Blunted foveal reflex, RPE mottling and clumping, mild atrophy, Drusen, rare MA, focal IRH and edema temporal mac -- improved Blunted foveal reflex, +CNV with pigment ring and surrounding edema -- stably improved, no heme, +drusen, RPE mottling   Vessels attenuated, Tortuous attenuated, mild tortuosity   Periphery Attached, scattered mild Reticular degeneration.  No heme. Attached, scattered Reticular degeneration, No heme.           IMAGING AND PROCEDURES  Imaging and Procedures for @TODAY @  OCT, Retina - OU - Both Eyes       Right Eye Quality was good. Central Foveal Thickness: 294. Progression has improved. Findings include normal foveal contour, no SRF, myopic contour, retinal drusen , intraretinal fluid, outer retinal atrophy (Mild interval improvement in IRF/cystic changes IT macula and fovea; Patchy ORA - stable).   Left Eye Quality was good. Central Foveal Thickness: 259. Progression has been stable. Findings include no IRF, no SRF, abnormal foveal contour, retinal drusen , subretinal hyper-reflective material, intraretinal hyper-reflective material, pigment epithelial detachment, outer retinal atrophy (Persistent focal PED/CNV with stable improvement in overlying SRHM and retinal edema).   Notes *Images captured  and stored on drive  Diagnosis / Impression: Mild DME OU OD: Mild interval improvement in IRF/cystic changes IT macula and fovea; Patchy ORA - stable OS: Exudative ARMD - Persistent focal PED/CNV with stable improvement in overlying SRHM and retinal edema -- nasal macula  Clinical management:  See below  Abbreviations: NFP - Normal foveal profile. CME - cystoid macular edema. PED - pigment epithelial detachment. IRF - intraretinal fluid. SRF - subretinal fluid. EZ - ellipsoid zone. ERM - epiretinal membrane. ORA - outer retinal atrophy. ORT - outer retinal tubulation. SRHM - subretinal hyper-reflective material       Intravitreal Injection, Pharmacologic Agent - OD - Right Eye       Time Out 02/13/2023. 1:51 PM. Confirmed correct patient, procedure, site, and patient consented.   Anesthesia Topical anesthesia was used. Anesthetic medications included Lidocaine 4%, Proparacaine 0.5%.   Procedure Preparation included 5% betadine to ocular surface, eyelid speculum. A supplied needle was used.   Injection: 1.25 mg Bevacizumab 1.25mg /0.69ml   Route: Intravitreal, Site: Right Eye   NDC: H061816, LotHX:[id number], Expiration date: 03/19/2023   Post-op Post injection exam found visual acuity of at least counting fingers. The patient tolerated the procedure well. There were no complications. The patient received written and verbal post procedure care education. Post injection medications were not given.            ASSESSMENT/PLAN:   ICD-10-CM   1. Exudative age-related macular degeneration of left eye with active choroidal neovascularization (HCC)  H35.3221 OCT, Retina - OU - Both Eyes    2. Intermediate stage nonexudative age-related macular degeneration of right eye  H35.3112     3. Both eyes affected by mild nonproliferative diabetic retinopathy with macular edema, associated with type 2 diabetes mellitus (HCC)  [id number] Intravitreal Injection, Pharmacologic Agent - OD - Right  Eye    Bevacizumab (AVASTIN)  SOLN 1.25 mg    4. Amblyopia of left eye  H53.002     5. Posterior vitreous detachment of left eye  H43.812     6. Pseudophakia of both eyes  Z96.1     7. Bilateral posterior capsular opacification  H26.493     8. Dry eyes  H04.123     9. 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, OS   - interval conversion to exudative ARMD noted on 7.26.22             - s/p IVA OS #1 (07.26.22), #2 (08.23.22), #3 (09.21.22), #4 (10.13.22), #5 (11.16.22), #6 (12.14.22), #7 (01.11.23), #8 (02.08.23), #9 (03.22.23), #10 (06.07.23), #11 (08.02.23), #12 (10.25.23), #13 (12.29.23)  - BCVA 20/250 OS (stable and at amblyopic baseline) - OCT shows OS: Exudative ARMD - Persistent focal PED/CNV with stable improvement in overlying SRHM and retinal edema -- nasal macula -- at 3 mos since last IVA OS - will hold off on OS again today - pt in agreement - IVA informed consent obtained and signed, 07.26.22 (OS) - f/u in 4 wks, DFE/OCT, possible injxn  2. Intermediate Age related macular degeneration, non-exudative, OD -- stable  - exam/OCT today shows OD: Mild interval improvement in IRF/cystic changes IT macula and fovea; Patchy ORA - stable  - suspect cystic changes mostly related to DM and HTN  - BCVA OD 20/30 from 20/40  - FA 01.04.21 - no CNVM OU -- no exudative disease  - continue amsler grid monitoring  3. Mild non-proliferative diabetic retinopathy, both eyes - s/p IVA OD #1 (01.29.24), #2 (02.26.24) - exam shows rare MA/IRH - FA 01.04.21 - late leaking, perifoveal MA OU; no NV OU - OCT OD shows mild interval improvement in IRF temporal macula and fovea; Patchy ORA - stable - BCVA 20/30 from 20/40  - recommend IVA OD #3 today, 03.25.24 for DME  - Pt wishes to proceed w/ injection - RBA of procedure discussed, questions answered - IVA informed consent obtained and signed 01.29.24 - see procedure note  - f/u 4 wks for  DFE, OCT, likely injection OD.   4. Severe amblyopia OS  - history of patching as a child -- no strabismus surgery  - VA dropped down to 20/350 from 20/200 (baseline) due to conv to exu ARMD on 7.26.22  5. PVD / vitreous syneresis OS  - Discussed findings and prognosis  - No RT or RD on 360 scleral depressed exam  - Reviewed s/s of RT/RD  - Strict return precautions for any such RT/RD signs/symptoms  6. Pseudophakia OU  - s/p CE/IOL OU by Monica Hamilton  - beautiful surgeries, doing well  - continue to monitor  7. PCO OU  - pt reports some fogginess of vision - s/p YAG Cap OD (01.15.24) -- good opening -- BCVA improved to 20/30 from 20/40 - recommend Yag Cap OS at some point.   8. Dry eyes OU  - persistnet PEE on slit lamp exam.   - cont artificial tears QID and lubricating ointment qhs as needed  9. Ocular Hypertension OU  - IOP today, 18,20   - cont Cosopt BID OU  Ophthalmic Meds Ordered this visit:  Meds ordered this encounter  Medications   Bevacizumab (AVASTIN) SOLN 1.25 mg     Return in about 4 weeks (around 03/13/2023) for f/u NPDR OU, DFE, OCT.  There are no Patient Instructions on file for this visit.  This  document serves as a record of services personally performed by Gardiner Sleeper, MD, PhD. It was created on their behalf by Orvan Falconer, an ophthalmic technician. The creation of this record is the provider's dictation and/or activities during the visit.    Electronically signed by: Orvan Falconer, OA, 02/15/23  12:30 AM  This document serves as a record of services personally performed by Gardiner Sleeper, MD, PhD. It was created on their behalf by San Jetty. Owens Shark, OA an ophthalmic technician. The creation of this record is the provider's dictation and/or activities during the visit.    Electronically signed by: San Jetty. Owens Shark, [place] 03.25.2024 12:30 AM  Gardiner Sleeper, M.D., Ph.D. Diseases & Surgery of the Retina and Vitreous [facility]  I have reviewed the above documentation for accuracy and completeness, and I agree with the above. Gardiner Sleeper, M.D., Ph.D. 02/15/23 12:33 AM   Abbreviations: M myopia (nearsighted); A astigmatism; H hyperopia (farsighted); P presbyopia; Mrx spectacle prescription;  CTL contact lenses; OD right eye; OS left eye; OU both eyes  XT exotropia; ET esotropia; PEK punctate epithelial keratitis; PEE punctate epithelial erosions; DES dry eye syndrome; MGD meibomian gland dysfunction; ATs artificial tears; PFAT's preservative free artificial tears; [place] nuclear sclerotic cataract; PSC posterior subcapsular cataract; ERM epi-retinal membrane; PVD posterior vitreous detachment; RD retinal detachment; DM diabetes mellitus; DR diabetic retinopathy; NPDR non-proliferative diabetic retinopathy; PDR proliferative diabetic retinopathy; CSME clinically significant macular edema; DME diabetic macular edema; dbh dot blot hemorrhages; CWS cotton wool spot; POAG primary open angle glaucoma; C/D cup-to-disc ratio; HVF humphrey visual field; GVF goldmann visual field; OCT optical coherence tomography; IOP intraocular pressure; BRVO Branch retinal vein occlusion; CRVO central retinal vein occlusion; CRAO central retinal artery occlusion; BRAO branch retinal artery occlusion; RT retinal tear; SB scleral buckle; PPV pars plana vitrectomy; VH Vitreous hemorrhage; PRP panretinal laser photocoagulation; IVK intravitreal kenalog; VMT vitreomacular traction; MH Macular hole;  NVD neovascularization of the disc; NVE neovascularization elsewhere; AREDS age related eye disease study; ARMD age related macular degeneration; POAG primary open angle glaucoma; EBMD epithelial/anterior basement membrane dystrophy; ACIOL anterior chamber intraocular lens; IOL intraocular lens; PCIOL posterior chamber intraocular lens; Phaco/IOL phacoemulsification with intraocular lens placement; [place] photorefractive keratectomy; LASIK laser assisted in situ  keratomileusis; HTN hypertension; DM diabetes mellitus; COPD chronic obstructive pulmonary disease

## 2023-02-13 ENCOUNTER — Encounter (INDEPENDENT_AMBULATORY_CARE_PROVIDER_SITE_OTHER): Payer: Self-pay | Admitting: Ophthalmology

## 2023-02-13 ENCOUNTER — Ambulatory Visit (INDEPENDENT_AMBULATORY_CARE_PROVIDER_SITE_OTHER): Payer: Medicare HMO | Admitting: Ophthalmology

## 2023-02-13 DIAGNOSIS — H26493 Other secondary cataract, bilateral: Secondary | ICD-10-CM | POA: Diagnosis not present

## 2023-02-13 DIAGNOSIS — H53002 Unspecified amblyopia, left eye: Secondary | ICD-10-CM | POA: Diagnosis not present

## 2023-02-13 DIAGNOSIS — Z961 Presence of intraocular lens: Secondary | ICD-10-CM | POA: Diagnosis not present

## 2023-02-13 DIAGNOSIS — H353221 Exudative age-related macular degeneration, left eye, with active choroidal neovascularization: Secondary | ICD-10-CM

## 2023-02-13 DIAGNOSIS — E113213 Type 2 diabetes mellitus with mild nonproliferative diabetic retinopathy with macular edema, bilateral: Secondary | ICD-10-CM

## 2023-02-13 DIAGNOSIS — H40053 Ocular hypertension, bilateral: Secondary | ICD-10-CM | POA: Diagnosis not present

## 2023-02-13 DIAGNOSIS — H43812 Vitreous degeneration, left eye: Secondary | ICD-10-CM

## 2023-02-13 DIAGNOSIS — H353112 Nonexudative age-related macular degeneration, right eye, intermediate dry stage: Secondary | ICD-10-CM | POA: Diagnosis not present

## 2023-02-13 DIAGNOSIS — H04123 Dry eye syndrome of bilateral lacrimal glands: Secondary | ICD-10-CM | POA: Diagnosis not present

## 2023-02-13 MED ORDER — BEVACIZUMAB CHEMO INJECTION 1.25MG/0.05ML SYRINGE FOR KALEIDOSCOPE
1.2500 mg | INTRAVITREAL | Status: AC | PRN
  Administered 2023-02-13: 1.25 mg via INTRAVITREAL

## 2023-02-20 DIAGNOSIS — E1129 Type 2 diabetes mellitus with other diabetic kidney complication: Secondary | ICD-10-CM | POA: Diagnosis not present

## 2023-02-20 DIAGNOSIS — I10 Essential (primary) hypertension: Secondary | ICD-10-CM | POA: Diagnosis not present

## 2023-02-20 DIAGNOSIS — E785 Hyperlipidemia, unspecified: Secondary | ICD-10-CM | POA: Diagnosis not present

## 2023-02-20 DIAGNOSIS — I129 Hypertensive chronic kidney disease with stage 1 through stage 4 chronic kidney disease, or unspecified chronic kidney disease: Secondary | ICD-10-CM | POA: Diagnosis not present

## 2023-02-20 DIAGNOSIS — Z7689 Persons encountering health services in other specified circumstances: Secondary | ICD-10-CM | POA: Diagnosis not present

## 2023-02-20 DIAGNOSIS — N182 Chronic kidney disease, stage 2 (mild): Secondary | ICD-10-CM | POA: Diagnosis not present

## 2023-02-27 DIAGNOSIS — R82998 Other abnormal findings in urine: Secondary | ICD-10-CM | POA: Diagnosis not present

## 2023-02-27 DIAGNOSIS — Z1339 Encounter for screening examination for other mental health and behavioral disorders: Secondary | ICD-10-CM | POA: Diagnosis not present

## 2023-02-27 DIAGNOSIS — Z1331 Encounter for screening for depression: Secondary | ICD-10-CM | POA: Diagnosis not present

## 2023-02-27 DIAGNOSIS — Z0000 Encounter for general adult medical examination without abnormal findings: Secondary | ICD-10-CM | POA: Diagnosis not present

## 2023-02-27 DIAGNOSIS — E1129 Type 2 diabetes mellitus with other diabetic kidney complication: Secondary | ICD-10-CM | POA: Diagnosis not present

## 2023-02-27 DIAGNOSIS — N182 Chronic kidney disease, stage 2 (mild): Secondary | ICD-10-CM | POA: Diagnosis not present

## 2023-02-27 DIAGNOSIS — I10 Essential (primary) hypertension: Secondary | ICD-10-CM | POA: Diagnosis not present

## 2023-02-27 DIAGNOSIS — H3530 Unspecified macular degeneration: Secondary | ICD-10-CM | POA: Diagnosis not present

## 2023-02-27 DIAGNOSIS — I129 Hypertensive chronic kidney disease with stage 1 through stage 4 chronic kidney disease, or unspecified chronic kidney disease: Secondary | ICD-10-CM | POA: Diagnosis not present

## 2023-03-07 NOTE — Progress Notes (Signed)
[facility] - Clinic Note  03/13/2023     CHIEF COMPLAINT Patient presents for Retina Follow Up   HISTORY OF PRESENT ILLNESS: Monica Hamilton is a 82 y.o. female who presents to the clinic today for:   HPI     Retina Follow Up   Patient presents with  Wet AMD.  In both eyes.  This started months ago.  Duration of 4 weeks.  I, the attending physician,  performed the HPI with the patient and updated documentation appropriately.        Comments   Patient feels that the vision is the same. She is using Cosopt OU BID and AT's OU PRN. She does not check her blood sugar.       Last edited by Rennis Chris, MD on 03/13/2023  4:43 PM.      Referring physician: Self-referral -- wife of Jesusita Oka  HISTORICAL INFORMATION:  Selected notes from the MEDICAL RECORD NUMBER Self referral Ocular Hx: pseudophakia OU [name] 2019) PMH: DM (on metformin), HTN, arthritis,    CURRENT MEDICATIONS: Current Outpatient Medications (Ophthalmic Drugs)  Medication Sig   dorzolamide-timolol (COSOPT) 2-0.5 % ophthalmic solution Place 1 drop into both eyes 2 (two) times daily.   No current facility-administered medications for this visit. (Ophthalmic Drugs)   Current Outpatient Medications (Other)  Medication Sig   amLODipine (NORVASC) 5 MG tablet Take 5 mg by mouth daily.   aspirin 81 MG EC tablet Take 81 mg by mouth daily.    CINNAMON PO Take 1,000 mg by mouth daily.   gabapentin (NEURONTIN) 100 MG capsule gabapentin 100 mg capsule  TAKE 1 CAPSULE BY MOUTH ONCE DAILY AT BEDTIME FOR 30 DAYS   gabapentin (NEURONTIN) 300 MG capsule Take 600 mg by mouth at bedtime.   HYDROcodone-acetaminophen (NORCO) 7.5-325 MG tablet Take 1 tablet by mouth every 6 (six) hours as needed for severe pain ((score 7 to 10)).   metFORMIN (GLUCOPHAGE) 1000 MG tablet Take 1,000 mg by mouth 2 (two) times daily.    methocarbamol (ROBAXIN) 500 MG tablet Take 1 tablet (500 mg total) by mouth every 6  (six) hours as needed for muscle spasms.   metoprolol succinate (TOPROL-XL) 50 MG 24 hr tablet Take 50 mg by mouth daily. Take with or immediately following a meal.   MODERNA COVID-19 VACCINE 100 MCG/0.5ML injection    Multiple Vitamins-Minerals (ICAPS AREDS 2) CAPS Take 1 capsule by mouth 2 (two) times daily.    rosuvastatin (CRESTOR) 20 MG tablet Take by mouth.   valsartan-hydrochlorothiazide (DIOVAN-HCT) 160-25 MG tablet Take 1 tablet by mouth daily.   No current facility-administered medications for this visit. (Other)   REVIEW OF SYSTEMS: ROS   Positive for: Musculoskeletal, Endocrine, Cardiovascular, Eyes Negative for: Constitutional, Gastrointestinal, Neurological, Skin, Genitourinary, HENT, Respiratory, Psychiatric, Allergic/Imm, Heme/Lymph Last edited by Julieanne Cotton, COT on 03/13/2023  1:31 PM.     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    Age 76, led to Mitral valve prolapse   Past Surgical History:  Procedure Laterality Date   ABDOMINAL HYSTERECTOMY     APPENDECTOMY     BLADDER SUSPENSION     BREAST BIOPSY Left    BREAST EXCISIONAL BIOPSY Left    CATARACT EXTRACTION     CHOLECYSTECTOMY     DILATION AND CURETTAGE OF UTERUS     EYE SURGERY     HEAD & NECK SKIN LESION EXCISIONAL BIOPSY     top left scalp   LUMBAR LAMINECTOMY/DECOMPRESSION MICRODISCECTOMY N/A 08/21/2019   Procedure: Lumbar decompression L3-4 right, microdiscectomy L3-4 right;  Surgeon: Ranee Gosselin, MD;  Location: [facility];  Service:  Orthopedics;  Laterality: N/A;    SHOULDER ARTHROSCOPY WITH SUBACROMIAL DECOMPRESSION Right 01/30/2013   Procedure: SHOULDER ARTHROSCOPY WITH SUBACROMIAL DECOMPRESSION;  Surgeon: Drucilla Schmidt, MD;  Location: [facility];  Service: Orthopedics;  Laterality: Right;  with labral debridement   FAMILY HISTORY Family History  Problem Relation Age of Onset   Heart disease Father    Breast cancer Maternal Aunt    Diabetes Brother    Leukemia Sister    Colon cancer Neg Hx    Esophageal cancer Neg Hx    Rectal cancer Neg Hx    Stomach cancer Neg Hx    SOCIAL HISTORY Social History   Tobacco Use   Smoking status: Never   Smokeless tobacco: Never  Vaping Use   Vaping Use: Never used  Substance Use Topics   Alcohol use: No   Drug use: No       OPHTHALMIC EXAM: Base Eye Exam     Visual Acuity (Snellen - Linear)       Right Left   Dist [place] 20/25 +1 20/200 +2   Dist ph [place] NI NI         Tonometry (Tonopen, 1:34 PM)       Right Left   Pressure 20 20         Pupils       Dark Light Shape React APD   Right 3 2 Round Brisk None   Left 3 2 Round Brisk None         Visual Fields       Left Right    Full Full         Extraocular Movement       Right Left    Full, Ortho Full, Ortho         Neuro/Psych     Oriented x3: Yes   Mood/Affect: Normal         Dilation     Both eyes: 1.0% Mydriacyl, 2.5% Phenylephrine @ 1:32 PM           Slit Lamp and Fundus Exam     Slit Lamp Exam       Right Left   Lids/Lashes Dermatochalasis - upper lid, mild Meibomian gland dysfunction Dermatochalasis - upper lid, Meibomian gland dysfunction   Conjunctiva/Sclera nasal and temporal pinguecula Nasal Pinguecula   Cornea tear film debris, trace PEE, mild arcus, well healed cataract wound 1+ Punctate epithelial erosions, Arcus, Temporal Well healed cataract wounds, trace Debris in tear film   Anterior Chamber Deep and quiet Deep and quiet   Iris Poorly dilated  Round and dilated   Lens PC IOL in good position, Open posterior capsule PC IOL in good position, 1-2+Posterior capsular opacification   Anterior Vitreous Vitreous syneresis, Posterior vitreous detachment, vitreous condensations Vitreous  syneresis, Posterior vitreous detachment         Fundus Exam       Right Left   Disc Pink and Sharp Pink and Sharp, mild temporal Peripapillary atrophy, mild PPP   C/D Ratio 0.5 0.4   Macula Flat, Blunted foveal reflex, RPE mottling and clumping, mild atrophy, Drusen, rare MA, focal IRH and edema / cystic changes temporal mac -- improved Blunted foveal reflex, +CNV with pigment ring and surrounding edema -- stably improved, no heme, +drusen, RPE mottling   Vessels attenuated, Tortuous attenuated, mild tortuosity   Periphery Attached, scattered mild Reticular degeneration.  No heme. Attached, scattered Reticular degeneration, No heme.           IMAGING AND PROCEDURES  Imaging and Procedures for @TODAY @  OCT, Retina - OU - Both Eyes       Right Eye Quality was good. Central Foveal Thickness: 299. Progression has improved. Findings include normal foveal contour, no SRF, myopic contour, retinal drusen , intraretinal fluid, outer retinal atrophy (Mild interval improvement in IRF/cystic changes IT macula and fovea; Patchy ORA - stable).   Left Eye Quality was good. Central Foveal Thickness: 261. Progression has been stable. Findings include no IRF, no SRF, abnormal foveal contour, retinal drusen , subretinal hyper-reflective material, intraretinal hyper-reflective material, pigment epithelial detachment, outer retinal atrophy (Persistent focal PED/CNV with stable improvement in overlying SRHM and retinal edema).   Notes *Images captured and stored on drive  Diagnosis / Impression: Mild DME OU OD: Mild interval improvement in IRF/cystic changes IT macula and fovea; Patchy ORA - stable OS: Exudative ARMD - Persistent focal PED/CNV with stable  improvement in overlying SRHM and retinal edema -- nasal macula  Clinical management:  See below  Abbreviations: NFP - Normal foveal profile. CME - cystoid macular edema. PED - pigment epithelial detachment. IRF - intraretinal fluid. SRF - subretinal fluid. EZ - ellipsoid zone. ERM - epiretinal membrane. ORA - outer retinal atrophy. ORT - outer retinal tubulation. SRHM - subretinal hyper-reflective material       Intravitreal Injection, Pharmacologic Agent - OD - Right Eye       Time Out 03/13/2023. 2:02 PM. Confirmed correct patient, procedure, site, and patient consented.   Anesthesia Topical anesthesia was used. Anesthetic medications included Lidocaine 2%, Proparacaine 0.5%.   Procedure Preparation included 5% betadine to ocular surface, eyelid speculum. A (32g) needle was used.   Injection: 1.25 mg Bevacizumab 1.25mg /0.27ml   Route: Intravitreal, Site: Right Eye   NDC: [id number], Lot: [id number], Expiration date: 06/15/2023   Post-op Post injection exam found visual acuity of at least counting fingers. The patient tolerated the procedure well. There were no complications. The patient received written and verbal post procedure care education. Post injection medications were not given.            ASSESSMENT/PLAN:   ICD-10-CM   1. Exudative age-related macular degeneration of left eye with active choroidal neovascularization  H35.3221     2. Intermediate stage nonexudative age-related macular degeneration of right eye  H35.3112     3. Both eyes affected by mild nonproliferative diabetic retinopathy with macular edema, associated with type 2 diabetes mellitus  E11.3213 OCT, Retina - OU - Both Eyes    Intravitreal Injection, Pharmacologic Agent - OD - Right Eye    Bevacizumab (AVASTIN) SOLN 1.25 mg    4. Amblyopia of left eye  H53.002     5. Posterior vitreous detachment of left eye  H43.812     6. Pseudophakia  of both eyes  Z96.1     7. Bilateral posterior capsular  opacification  H26.493     8. Dry eyes  H04.123     9. 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, OS   - interval conversion to exudative ARMD noted on 7.26.22             - s/p IVA OS #1 (07.26.22), #2 (08.23.22), #3 (09.21.22), #4 (10.13.22), #5 (11.16.22), #6 (12.14.22), #7 (01.11.23), #8 (02.08.23), #9 (03.22.23), #10 (06.07.23), #11 (08.02.23), #12 (10.25.23), #13 (12.29.23)  - BCVA 20/250 OS (stable and at amblyopic baseline) - OCT shows OS: Persistent focal PED/CNV with stable improvement in overlying SRHM and retinal edema -- nasal macula -- at 3+ mos since last IVA OS - will hold off on OS again today - pt in agreement - IVA informed consent obtained and signed, 07.26.22 (OS) - f/u in 4-5 wks, DFE/OCT, possible injxn  2. Intermediate Age related macular degeneration, non-exudative, OD -- stable  - exam/OCT today shows OD: Mild interval improvement in IRF/cystic changes IT macula and fovea; Patchy ORA - stable  - suspect cystic changes mostly related to DM and HTN  - BCVA OD 20/30 from 20/40  - FA 01.04.21 - no CNVM OU -- no exudative disease  - continue amsler grid monitoring  3. Mild non-proliferative diabetic retinopathy, both eyes - s/p IVA OD #1 (01.29.24), #2 (02.26.24), #3 (03.25.24) - exam shows rare MA/IRH - FA 01.04.21 - late leaking, perifoveal MA OU; no NV OU - OCT OD shows mild interval improvement in IRF temporal macula and fovea; Patchy ORA - stable at 4 weeks - BCVA 20/25 from 20/30  - recommend IVA OD #4 today, 04.22.24 with follow up extended to 4-5 weeks  - Pt wishes to proceed w/ injection - RBA of procedure discussed, questions answered - IVA informed consent obtained and signed 01.29.24 - see procedure note  - f/u 4-5 wks for DFE, OCT, likely injection OD.   4. Severe amblyopia OS  - history of patching as a child -- no strabismus surgery  - VA dropped down to 20/350 from 20/200 (baseline) due to conv  to exu ARMD on 7.26.22  5. PVD / vitreous syneresis OS  - Discussed findings and prognosis  - No RT or RD on 360 scleral depressed exam  - Reviewed s/s of RT/RD  - Strict return precautions for any such RT/RD signs/symptoms  6. Pseudophakia OU  - s/p CE/IOL OU by [name]  - beautiful surgeries, doing well  - continue to monitor  7. PCO OU  - pt reports some fogginess of vision - s/p YAG Cap OD (01.15.24) -- good opening -- BCVA improved to 20/30 from 20/40 - recommend Yag Cap OS at some point.   8. Dry eyes OU  - persistnet PEE on slit lamp exam.   - cont artificial tears QID and lubricating ointment qhs as needed  9. Ocular Hypertension OU  - IOP today, 20 OU   - cont Cosopt BID OU  Ophthalmic Meds Ordered this visit:  Meds ordered this encounter  Medications   Bevacizumab (AVASTIN) SOLN 1.25 mg     Return for f/u 4-5 weeks,  DME OD, exu ARMD OS, DFE, OCT, Possible Injxn.  There are no Patient Instructions on file for this visit.  This document serves as a record of services personally performed by Karie Chimera, MD, PhD. It was created on their behalf  by De Blanch, an ophthalmic technician. The creation of this record is the provider's dictation and/or activities during the visit.    Electronically signed by: De Blanch, OA, 03/15/23  12:13 AM  This document serves as a record of services personally performed by Karie Chimera, MD, PhD. It was created on their behalf by Glee Arvin. Manson Passey, OA an ophthalmic technician. The creation of this record is the provider's dictation and/or activities during the visit.    Electronically signed by: Glee Arvin. Manson Passey, [place] 04.22.2024 12:13 AM  Karie Chimera, M.D., Ph.D. Diseases & Surgery of the Retina and Vitreous [facility]  I have reviewed the above documentation for accuracy and completeness, and I agree with the above. Karie Chimera, M.D., Ph.D. 03/15/23 12:15 AM   Abbreviations: M  myopia (nearsighted); A astigmatism; H hyperopia (farsighted); P presbyopia; Mrx spectacle prescription;  CTL contact lenses; OD right eye; OS left eye; OU both eyes  XT exotropia; ET esotropia; PEK punctate epithelial keratitis; PEE punctate epithelial erosions; DES dry eye syndrome; MGD meibomian gland dysfunction; ATs artificial tears; PFAT's preservative free artificial tears; NSC nuclear sclerotic cataract; PSC posterior subcapsular cataract; ERM epi-retinal membrane; PVD posterior vitreous detachment; RD retinal detachment; DM diabetes mellitus; DR diabetic retinopathy; NPDR non-proliferative diabetic retinopathy; PDR proliferative diabetic retinopathy; CSME clinically significant macular edema; DME diabetic macular edema; dbh dot blot hemorrhages; CWS cotton wool spot; POAG primary open angle glaucoma; C/D cup-to-disc ratio; HVF humphrey visual field; GVF goldmann visual field; OCT optical coherence tomography; IOP intraocular pressure; BRVO Branch retinal vein occlusion; CRVO central retinal vein occlusion; CRAO central retinal artery occlusion; BRAO branch retinal artery occlusion; RT retinal tear; SB scleral buckle; PPV pars plana vitrectomy; VH Vitreous hemorrhage; PRP panretinal laser photocoagulation; IVK intravitreal kenalog; VMT vitreomacular traction; MH Macular hole;  NVD neovascularization of the disc; NVE neovascularization elsewhere; AREDS age related eye disease study; ARMD age related macular degeneration; POAG primary open angle glaucoma; EBMD epithelial/anterior basement membrane dystrophy; ACIOL anterior chamber intraocular lens; IOL intraocular lens; PCIOL posterior chamber intraocular lens; Phaco/IOL phacoemulsification with intraocular lens placement; PRK photorefractive keratectomy; LASIK laser assisted in situ keratomileusis; HTN hypertension; DM diabetes mellitus; COPD chronic obstructive pulmonary disease

## 2023-03-13 ENCOUNTER — Encounter (INDEPENDENT_AMBULATORY_CARE_PROVIDER_SITE_OTHER): Payer: Self-pay | Admitting: Ophthalmology

## 2023-03-13 ENCOUNTER — Ambulatory Visit (INDEPENDENT_AMBULATORY_CARE_PROVIDER_SITE_OTHER): Payer: Medicare HMO | Admitting: Ophthalmology

## 2023-03-13 DIAGNOSIS — Z961 Presence of intraocular lens: Secondary | ICD-10-CM

## 2023-03-13 DIAGNOSIS — H353112 Nonexudative age-related macular degeneration, right eye, intermediate dry stage: Secondary | ICD-10-CM | POA: Diagnosis not present

## 2023-03-13 DIAGNOSIS — H40053 Ocular hypertension, bilateral: Secondary | ICD-10-CM | POA: Diagnosis not present

## 2023-03-13 DIAGNOSIS — H04123 Dry eye syndrome of bilateral lacrimal glands: Secondary | ICD-10-CM

## 2023-03-13 DIAGNOSIS — H43812 Vitreous degeneration, left eye: Secondary | ICD-10-CM

## 2023-03-13 DIAGNOSIS — H53002 Unspecified amblyopia, left eye: Secondary | ICD-10-CM

## 2023-03-13 DIAGNOSIS — H353221 Exudative age-related macular degeneration, left eye, with active choroidal neovascularization: Secondary | ICD-10-CM | POA: Diagnosis not present

## 2023-03-13 DIAGNOSIS — E113213 Type 2 diabetes mellitus with mild nonproliferative diabetic retinopathy with macular edema, bilateral: Secondary | ICD-10-CM

## 2023-03-13 DIAGNOSIS — H26493 Other secondary cataract, bilateral: Secondary | ICD-10-CM | POA: Diagnosis not present

## 2023-03-13 MED ORDER — BEVACIZUMAB CHEMO INJECTION 1.25MG/0.05ML SYRINGE FOR KALEIDOSCOPE
1.2500 mg | INTRAVITREAL | Status: AC | PRN
Start: 2023-03-13 — End: 2023-03-13
  Administered 2023-03-13: 1.25 mg via INTRAVITREAL

## 2023-04-03 DIAGNOSIS — M5137 Other intervertebral disc degeneration, lumbosacral region: Secondary | ICD-10-CM | POA: Diagnosis not present

## 2023-04-03 DIAGNOSIS — M5136 Other intervertebral disc degeneration, lumbar region: Secondary | ICD-10-CM | POA: Diagnosis not present

## 2023-04-03 DIAGNOSIS — M5441 Lumbago with sciatica, right side: Secondary | ICD-10-CM | POA: Diagnosis not present

## 2023-04-10 NOTE — Progress Notes (Signed)
[facility] - Clinic Note  04/18/2023     CHIEF COMPLAINT Patient presents for Retina Follow Up   HISTORY OF PRESENT ILLNESS: Monica Hamilton is a 82 y.o. female who presents to the clinic today for:   HPI     Retina Follow Up   Patient presents with  Wet AMD.  In both eyes.  This started 5 weeks ago.  I, the attending physician,  performed the HPI with the patient and updated documentation appropriately.        Comments   Patient here for 5 weeks retina follow up for exu ARMD OU. Patient states vision no difference. No eye pain.       Last edited by Rennis Chris, MD on 04/18/2023  4:31 PM.    Pt states vision is stable, still able to work on her puzzles   Referring physician: Self-referral -- wife of [name]  HISTORICAL INFORMATION:  Selected notes from the MEDICAL RECORD NUMBER Self referral Ocular Hx: pseudophakia OU [name] 2019) PMH: DM (on metformin), HTN, arthritis,    CURRENT MEDICATIONS: Current Outpatient Medications (Ophthalmic Drugs)  Medication Sig   dorzolamide-timolol (COSOPT) 2-0.5 % ophthalmic solution Place 1 drop into both eyes 2 (two) times daily.   No current facility-administered medications for this visit. (Ophthalmic Drugs)   Current Outpatient Medications (Other)  Medication Sig   amLODipine (NORVASC) 5 MG tablet Take 5 mg by mouth daily.   aspirin 81 MG EC tablet Take 81 mg by mouth daily.    CINNAMON PO Take 1,000 mg by mouth daily.   gabapentin (NEURONTIN) 100 MG capsule gabapentin 100 mg capsule  TAKE 1 CAPSULE BY MOUTH ONCE DAILY AT BEDTIME FOR 30 DAYS   gabapentin (NEURONTIN) 300 MG capsule Take 600 mg by mouth at bedtime.   HYDROcodone-acetaminophen (NORCO) 7.5-325 MG tablet Take 1 tablet by mouth every 6 (six) hours as needed for severe pain ((score 7 to 10)).   metFORMIN (GLUCOPHAGE) 1000 MG tablet Take 1,000 mg by mouth 2 (two) times daily.    methocarbamol (ROBAXIN) 500 MG tablet Take 1 tablet (500 mg  total) by mouth every 6 (six) hours as needed for muscle spasms.   metoprolol succinate (TOPROL-XL) 50 MG 24 hr tablet Take 50 mg by mouth daily. Take with or immediately following a meal.   MODERNA COVID-19 VACCINE 100 MCG/0.5ML injection    Multiple Vitamins-Minerals (ICAPS AREDS 2) CAPS Take 1 capsule by mouth 2 (two) times daily.    rosuvastatin (CRESTOR) 20 MG tablet Take by mouth.   valsartan-hydrochlorothiazide (DIOVAN-HCT) 160-25 MG tablet Take 1 tablet by mouth daily.   No current facility-administered medications for this visit. (Other)   REVIEW OF SYSTEMS: ROS   Positive for: Musculoskeletal, Endocrine, Cardiovascular, Eyes Negative for: Constitutional, Gastrointestinal, Neurological, Skin, Genitourinary, HENT, Respiratory, Psychiatric, Allergic/Imm, Heme/Lymph Last edited by Laddie Aquas, COA on 04/18/2023  1:36 PM.      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   Past Surgical History:  Procedure Laterality Date   ABDOMINAL HYSTERECTOMY     APPENDECTOMY     BLADDER SUSPENSION     BREAST BIOPSY Left    BREAST EXCISIONAL BIOPSY Left    CATARACT EXTRACTION     CHOLECYSTECTOMY     DILATION AND CURETTAGE OF UTERUS     EYE SURGERY     HEAD & NECK SKIN LESION EXCISIONAL BIOPSY     top left scalp   LUMBAR LAMINECTOMY/DECOMPRESSION MICRODISCECTOMY N/A 08/21/2019   Procedure: Lumbar decompression L3-4 right, microdiscectomy L3-4 right;  Surgeon: Ranee Gosselin,  MD;  Location: [facility];  Service: Orthopedics;  Laterality: N/A;    SHOULDER ARTHROSCOPY WITH SUBACROMIAL DECOMPRESSION Right 01/30/2013   Procedure: SHOULDER ARTHROSCOPY WITH SUBACROMIAL DECOMPRESSION;  Surgeon: Drucilla Schmidt, MD;  Location: [facility];  Service: Orthopedics;  Laterality: Right;  with labral debridement   FAMILY HISTORY Family History  Problem Relation Age of Onset   Heart disease Father    Breast cancer Maternal Aunt    Diabetes Brother    Leukemia Sister    Colon cancer Neg Hx    Esophageal cancer Neg Hx    Rectal cancer Neg Hx    Stomach cancer Neg Hx    SOCIAL HISTORY Social History   Tobacco Use   Smoking status: Never   Smokeless tobacco: Never  Vaping Use   Vaping Use: Never used  Substance Use Topics   Alcohol use: No   Drug use: No       OPHTHALMIC EXAM: Base Eye Exam     Visual Acuity (Snellen - Linear)       Right Left   Dist [place] 20/30 -2 20/250 +1   Dist ph [place] 20/25 -2 NI         Tonometry (Tonopen, 1:34 PM)       Right Left   Pressure 19 21         Pupils       Dark Light Shape React APD   Right 3 2 Round Brisk None   Left 3 2 Round Brisk None         Visual Fields (Counting fingers)       Left Right    Full Full         Extraocular Movement       Right Left    Full, Ortho Full, Ortho         Neuro/Psych     Oriented x3: Yes   Mood/Affect: Normal         Dilation     Both eyes: 1.0% Mydriacyl, 2.5% Phenylephrine @ 1:34 PM           Slit Lamp and Fundus Exam     Slit Lamp Exam       Right Left   Lids/Lashes Dermatochalasis - upper lid, mild Meibomian gland dysfunction Dermatochalasis - upper lid, Meibomian gland dysfunction   Conjunctiva/Sclera nasal and temporal pinguecula Nasal Pinguecula   Cornea tear film debris, trace PEE, mild arcus, well healed cataract wound 1+ Punctate epithelial erosions, Arcus, Temporal Well healed cataract wounds, trace Debris in tear film   Anterior  Chamber Deep and quiet Deep and quiet   Iris Poorly dilated Round and dilated   Lens PC IOL in good position, Open posterior capsule PC IOL in good position, 1-2+Posterior capsular opacification   Anterior Vitreous Vitreous  syneresis, Posterior vitreous detachment, vitreous condensations Vitreous syneresis, Posterior vitreous detachment         Fundus Exam       Right Left   Disc Pink and Sharp Pink and Sharp, mild temporal Peripapillary atrophy, mild PPP   C/D Ratio 0.5 0.4   Macula Flat, Blunted foveal reflex, RPE mottling and clumping, mild atrophy, Drusen, rare MA, focal IRH and edema / cystic changes temporal mac -- persistent Blunted foveal reflex, +CNV with pigment ring and surrounding edema -- stably improved, no heme, +drusen, RPE mottling   Vessels attenuated, Tortuous attenuated, mild tortuosity   Periphery Attached, scattered mild Reticular degeneration.  No heme. Attached, scattered Reticular degeneration, No heme.           IMAGING AND PROCEDURES  Imaging and Procedures for @TODAY @  OCT, Retina - OU - Both Eyes       Right Eye Quality was good. Central Foveal Thickness: 292. Progression has been stable. Findings include normal foveal contour, no SRF, myopic contour, retinal drusen , intraretinal fluid, outer retinal atrophy (persistent IRF/cystic changes IT macula and fovea -- slightly improved; Patchy ORA - stable).   Left Eye Quality was good. Central Foveal Thickness: 260. Progression has been stable. Findings include no IRF, no SRF, abnormal foveal contour, retinal drusen , subretinal hyper-reflective material, intraretinal hyper-reflective material, pigment epithelial detachment, outer retinal atrophy (Persistent focal PED/CNV with stable improvement in overlying SRHM and retinal edema).   Notes *Images captured and stored on drive  Diagnosis / Impression: Mild DME OU OD: persistent IRF/cystic changes IT macula and fovea -- slightly improved; Patchy ORA -  stable OS: Exudative ARMD - Persistent focal PED/CNV with stable improvement in overlying SRHM and retinal edema -- nasal macula  Clinical management:  See below  Abbreviations: NFP - Normal foveal profile. CME - cystoid macular edema. PED - pigment epithelial detachment. IRF - intraretinal fluid. SRF - subretinal fluid. EZ - ellipsoid zone. ERM - epiretinal membrane. ORA - outer retinal atrophy. ORT - outer retinal tubulation. SRHM - subretinal hyper-reflective material       Intravitreal Injection, Pharmacologic Agent - OD - Right Eye       Time Out 04/18/2023. 2:26 PM. Confirmed correct patient, procedure, site, and patient consented.   Anesthesia Topical anesthesia was used. Anesthetic medications included Lidocaine 2%, Proparacaine 0.5%.   Procedure Preparation included 5% betadine to ocular surface, eyelid speculum. A (32g) needle was used.   Injection: 1.25 mg Bevacizumab 1.25mg /0.24ml   Route: Intravitreal, Site: Right Eye   NDC: [id number], Lot: [id number], Expiration date: 07/22/2023   Post-op Post injection exam found visual acuity of at least counting fingers. The patient tolerated the procedure well. There were no complications. The patient received written and verbal post procedure care education. Post injection medications were not given.            ASSESSMENT/PLAN:   ICD-10-CM   1. Exudative age-related macular degeneration of left eye with active choroidal neovascularization (HCC)  H35.3221 OCT, Retina - OU - Both Eyes    2. Intermediate stage nonexudative age-related macular degeneration of right eye  H35.3112     3. Both eyes affected by mild nonproliferative diabetic retinopathy with macular edema, associated with type 2 diabetes mellitus (HCC)  Y86.5784 Intravitreal Injection, Pharmacologic Agent - OD - Right Eye    Bevacizumab (AVASTIN) SOLN 1.25 mg    4. Amblyopia of left eye  H53.002     5. Posterior vitreous detachment of left eye  H43.812  6. Pseudophakia of both eyes  Z96.1     7. Bilateral posterior capsular opacification  H26.493     8. Dry eyes  H04.123     9. 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, OS   - interval conversion to exudative ARMD noted on 7.26.22             - s/p IVA OS #1 (07.26.22), #2 (08.23.22), #3 (09.21.22), #4 (10.13.22), #5 (11.16.22), #6 (12.14.22), #7 (01.11.23), #8 (02.08.23), #9 (03.22.23), #10 (06.07.23), #11 (08.02.23), #12 (10.25.23), #13 (12.29.23)  - BCVA 20/250 OS (stable and at amblyopic baseline) - OCT shows OS: Persistent focal PED/CNV with stable improvement in overlying SRHM and retinal edema -- nasal macula -- at 5+ mos since last IVA OS - will hold off on OS again today - pt in agreement - IVA informed consent obtained and signed, 07.26.22 (OS) - f/u in 6 wks, DFE/OCT, possible injxn  2. Intermediate Age related macular degeneration, non-exudative, OD -- stable  - exam/OCT today shows OD: Mild interval improvement in IRF/cystic changes IT macula and fovea; Patchy ORA - stable  - suspect cystic changes mostly related to DM and HTN  - BCVA OD 20/25 from 20/30  - FA 01.04.21 - no CNVM OU -- no exudative disease  - continue amsler grid monitoring  3. Mild non-proliferative diabetic retinopathy, both eyes - s/p IVA OD #1 (01.29.24), #2 (02.26.24), #3 (03.25.24), #4 (04.22.24) - exam shows rare MA/IRH - FA 01.04.21 - late leaking, perifoveal MA OU; no NV OU - OCT OD shows persistent IRF/cystic changes IT macula and fovea -- slightly improved; Patchy ORA - stable at 4 weeks - BCVA OD 20/25 -- stable **discussed decreased efficacy / resistance to Avastin and potential benefit of switching medication**  - recommend IVA OD #5 today, 05.28.24 with follow up extended to 6 weeks  - Pt wishes to proceed w/ injection - RBA of procedure discussed, questions answered - IVA informed consent obtained and signed 01.29.24 - see procedure  note  - will check insurance auth for Eylea - f/u 6 wks for DFE, OCT, likely injection OD.   4. Severe amblyopia OS  - history of patching as a child -- no strabismus surgery  - VA dropped down to 20/350 from 20/200 (baseline) due to conv to exu ARMD on 7.26.22  5. PVD / vitreous syneresis OS  - Discussed findings and prognosis  - No RT or RD on 360 scleral depressed exam  - Reviewed s/s of RT/RD  - Strict return precautions for any such RT/RD signs/symptoms  6. Pseudophakia OU  - s/p CE/IOL OU by [name]  - beautiful surgeries, doing well  - continue to monitor  7. PCO OU  - pt reports some fogginess of vision - s/p YAG Cap OD (01.15.24) -- good opening -- BCVA improved to 20/30 from 20/40 - recommend Yag Cap OS at some point.   8. Dry eyes OU  - persistnet PEE on slit lamp exam.   - cont artificial tears QID and lubricating ointment qhs as needed  9. Ocular Hypertension OU  - IOP today, 19,21   - cont Cosopt BID OU  Ophthalmic Meds Ordered this visit:  Meds ordered this encounter  Medications   Bevacizumab (AVASTIN) SOLN 1.25 mg     Return in about 6 weeks (around 05/30/2023) for f/u exu ARMD OS, DFE, OCT.  There are no Patient Instructions on file for this visit.  This document serves as a record of services personally performed by Karie Chimera, MD, PhD. It was created on their behalf by Gerilyn Nestle, COT an ophthalmic technician. The creation of this record is the provider's dictation and/or activities during the visit.    Electronically signed by:  Gerilyn Nestle, COT  5.20.24  5:46 PM  This document serves as a record of services personally performed by Karie Chimera, MD, PhD. It was created on their behalf by Glee Arvin. Manson Passey, OA an ophthalmic technician. The creation of this record is the provider's dictation and/or activities during the visit.    Electronically signed by: Glee Arvin. Manson Passey, OA @TODAY @ 5:46 PM  Karie Chimera, M.D., Ph.D. Diseases  & Surgery of the Retina and Vitreous [facility]  I have reviewed the above documentation for accuracy and completeness, and I agree with the above. Karie Chimera, M.D., Ph.D. 04/20/23 5:49 PM   Abbreviations: M myopia (nearsighted); A astigmatism; H hyperopia (farsighted); P presbyopia; Mrx spectacle prescription;  CTL contact lenses; OD right eye; OS left eye; OU both eyes  XT exotropia; ET esotropia; PEK punctate epithelial keratitis; PEE punctate epithelial erosions; DES dry eye syndrome; MGD meibomian gland dysfunction; ATs artificial tears; PFAT's preservative free artificial tears; NSC nuclear sclerotic cataract; PSC posterior subcapsular cataract; ERM epi-retinal membrane; PVD posterior vitreous detachment; RD retinal detachment; DM diabetes mellitus; DR diabetic retinopathy; NPDR non-proliferative diabetic retinopathy; PDR proliferative diabetic retinopathy; CSME clinically significant macular edema; DME diabetic macular edema; dbh dot blot hemorrhages; CWS cotton wool spot; POAG primary open angle glaucoma; C/D cup-to-disc ratio; HVF humphrey visual field; GVF goldmann visual field; OCT optical coherence tomography; IOP intraocular pressure; BRVO Branch retinal vein occlusion; CRVO central retinal vein occlusion; CRAO central retinal artery occlusion; BRAO branch retinal artery occlusion; RT retinal tear; SB scleral buckle; PPV pars plana vitrectomy; VH Vitreous hemorrhage; PRP panretinal laser photocoagulation; IVK intravitreal kenalog; VMT vitreomacular traction; MH Macular hole;  NVD neovascularization of the disc; NVE neovascularization elsewhere; AREDS age related eye disease study; ARMD age related macular degeneration; POAG primary open angle glaucoma; EBMD epithelial/anterior basement membrane dystrophy; ACIOL anterior chamber intraocular lens; IOL intraocular lens; PCIOL posterior chamber intraocular lens; Phaco/IOL phacoemulsification with intraocular lens  placement; PRK photorefractive keratectomy; LASIK laser assisted in situ keratomileusis; HTN hypertension; DM diabetes mellitus; COPD chronic obstructive pulmonary disease

## 2023-04-18 ENCOUNTER — Ambulatory Visit (INDEPENDENT_AMBULATORY_CARE_PROVIDER_SITE_OTHER): Payer: Medicare HMO | Admitting: Ophthalmology

## 2023-04-18 ENCOUNTER — Encounter (INDEPENDENT_AMBULATORY_CARE_PROVIDER_SITE_OTHER): Payer: Self-pay | Admitting: Ophthalmology

## 2023-04-18 DIAGNOSIS — H353221 Exudative age-related macular degeneration, left eye, with active choroidal neovascularization: Secondary | ICD-10-CM

## 2023-04-18 DIAGNOSIS — E113213 Type 2 diabetes mellitus with mild nonproliferative diabetic retinopathy with macular edema, bilateral: Secondary | ICD-10-CM | POA: Diagnosis not present

## 2023-04-18 DIAGNOSIS — H43812 Vitreous degeneration, left eye: Secondary | ICD-10-CM | POA: Diagnosis not present

## 2023-04-18 DIAGNOSIS — Z961 Presence of intraocular lens: Secondary | ICD-10-CM | POA: Diagnosis not present

## 2023-04-18 DIAGNOSIS — H04123 Dry eye syndrome of bilateral lacrimal glands: Secondary | ICD-10-CM

## 2023-04-18 DIAGNOSIS — H53002 Unspecified amblyopia, left eye: Secondary | ICD-10-CM

## 2023-04-18 DIAGNOSIS — H353112 Nonexudative age-related macular degeneration, right eye, intermediate dry stage: Secondary | ICD-10-CM

## 2023-04-18 DIAGNOSIS — H26493 Other secondary cataract, bilateral: Secondary | ICD-10-CM

## 2023-04-18 DIAGNOSIS — H40053 Ocular hypertension, bilateral: Secondary | ICD-10-CM

## 2023-04-18 MED ORDER — BEVACIZUMAB CHEMO INJECTION 1.25MG/0.05ML SYRINGE FOR KALEIDOSCOPE
1.2500 mg | INTRAVITREAL | Status: AC | PRN
Start: 2023-04-18 — End: 2023-04-18
  Administered 2023-04-18: 1.25 mg via INTRAVITREAL

## 2023-05-16 NOTE — Progress Notes (Signed)
[facility] - Clinic Note  05/30/2023     CHIEF COMPLAINT Patient presents for Retina Follow Up   HISTORY OF PRESENT ILLNESS: Monica Hamilton is a 82 y.o. female who presents to the clinic today for:   HPI     Retina Follow Up   Patient presents with  Wet AMD.  In left eye.  This started 6 weeks ago.        Comments   Patient here for 6 weeks retina follow up for exu ARMD OS. Patient states vision about the same. No eye pain. Floaters lasted longer than usual. Went from big to small. Gone now. Only once in a while has floaters.       Last edited by Laddie Aquas, COA on 05/30/2023 10:09 AM.     Pt states vision is stable, still able to work on her puzzles   Referring physician: Self-referral -- wife of Evani Burdi  HISTORICAL INFORMATION:  Selected notes from the MEDICAL RECORD NUMBER Self referral Ocular Hx: pseudophakia OU [name] 2019) PMH: DM (on metformin), HTN, arthritis,    CURRENT MEDICATIONS: Current Outpatient Medications (Ophthalmic Drugs)  Medication Sig   dorzolamide-timolol (COSOPT) 2-0.5 % ophthalmic solution Place 1 drop into both eyes 2 (two) times daily.   No current facility-administered medications for this visit. (Ophthalmic Drugs)   Current Outpatient Medications (Other)  Medication Sig   amLODipine (NORVASC) 5 MG tablet Take 5 mg by mouth daily.   aspirin 81 MG EC tablet Take 81 mg by mouth daily.    CINNAMON PO Take 1,000 mg by mouth daily.   gabapentin (NEURONTIN) 100 MG capsule gabapentin 100 mg capsule  TAKE 1 CAPSULE BY MOUTH ONCE DAILY AT BEDTIME FOR 30 DAYS   gabapentin (NEURONTIN) 300 MG capsule Take 600 mg by mouth at bedtime.   HYDROcodone-acetaminophen (NORCO) 7.5-325 MG tablet Take 1 tablet by mouth every 6 (six) hours as needed for severe pain ((score 7 to 10)).   metFORMIN (GLUCOPHAGE) 1000 MG tablet Take 1,000 mg by mouth 2 (two) times daily.    methocarbamol (ROBAXIN) 500 MG tablet Take 1 tablet (500  mg total) by mouth every 6 (six) hours as needed for muscle spasms.   metoprolol succinate (TOPROL-XL) 50 MG 24 hr tablet Take 50 mg by mouth daily. Take with or immediately following a meal.   MODERNA COVID-19 VACCINE 100 MCG/0.5ML injection    Multiple Vitamins-Minerals (ICAPS AREDS 2) CAPS Take 1 capsule by mouth 2 (two) times daily.    rosuvastatin (CRESTOR) 20 MG tablet Take by mouth.   valsartan-hydrochlorothiazide (DIOVAN-HCT) 160-25 MG tablet Take 1 tablet by mouth daily.   No current facility-administered medications for this visit. (Other)   REVIEW OF SYSTEMS: ROS   Positive for: Musculoskeletal, Endocrine, Cardiovascular, Eyes Negative for: Constitutional, Gastrointestinal, Neurological, Skin, Genitourinary, HENT, Respiratory, Psychiatric, Allergic/Imm, Heme/Lymph Last edited by Laddie Aquas, COA on 05/30/2023 10:09 AM.       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   Past Surgical History:  Procedure Laterality Date   ABDOMINAL HYSTERECTOMY     APPENDECTOMY     BLADDER SUSPENSION     BREAST BIOPSY Left    BREAST EXCISIONAL BIOPSY Left    CATARACT EXTRACTION     CHOLECYSTECTOMY     DILATION AND CURETTAGE OF UTERUS     EYE SURGERY     HEAD & NECK SKIN LESION EXCISIONAL BIOPSY     top left scalp   LUMBAR LAMINECTOMY/DECOMPRESSION MICRODISCECTOMY N/A 08/21/2019   Procedure: Lumbar decompression L3-4 right, microdiscectomy L3-4 right;  Surgeon: Ranee Gosselin, MD;  Location: [facility];  Service: Orthopedics;  Laterality: N/A;    SHOULDER ARTHROSCOPY WITH SUBACROMIAL DECOMPRESSION Right 01/30/2013   Procedure: SHOULDER ARTHROSCOPY WITH SUBACROMIAL DECOMPRESSION;  Surgeon: Drucilla Schmidt, MD;  Location: [facility];  Service: Orthopedics;  Laterality: Right;  with labral debridement   FAMILY HISTORY Family History  Problem Relation Age of Onset   Heart disease Father    Breast cancer Maternal Aunt    Diabetes Brother    Leukemia Sister    Colon cancer Neg Hx    Esophageal cancer Neg Hx    Rectal cancer Neg Hx    Stomach cancer Neg Hx    SOCIAL HISTORY Social History   Tobacco Use   Smoking status: Never   Smokeless tobacco: Never  Vaping Use   Vaping Use: Never used  Substance Use Topics   Alcohol use: No   Drug use: No       OPHTHALMIC EXAM: Base Eye Exam     Visual Acuity (Snellen - Linear)       Right Left   Dist [place] 20/30 -2 20/250 -2   Dist ph [place] NI NI         Tonometry (Tonopen, 10:06 AM)       Right Left   Pressure 19 19         Pupils       Dark Light Shape React APD   Right 3 2 Round Brisk None   Left 3 2 Round Brisk None         Visual Fields (Counting fingers)       Left Right    Full Full         Extraocular Movement       Right Left    Full, Ortho Full, Ortho         Neuro/Psych     Oriented x3: Yes   Mood/Affect: Normal         Dilation     Both eyes: 1.0% Mydriacyl, 2.5% Phenylephrine @ 10:06 AM           Slit Lamp and Fundus Exam     Slit Lamp Exam       Right Left   Lids/Lashes Dermatochalasis - upper lid, mild Meibomian gland dysfunction Dermatochalasis - upper lid, Meibomian gland dysfunction   Conjunctiva/Sclera nasal and temporal pinguecula Nasal Pinguecula   Cornea tear film debris, trace PEE, mild arcus, well healed cataract wound 1+ Punctate epithelial erosions, Arcus, Temporal Well healed cataract wounds, trace Debris in tear film   Anterior  Chamber Deep and quiet Deep and quiet   Iris Poorly dilated Round and dilated   Lens PC IOL in good position, Open posterior capsule PC IOL in good position, 1-2+Posterior  capsular opacification   Anterior Vitreous Vitreous syneresis, Posterior vitreous detachment, vitreous condensations Vitreous syneresis, Posterior vitreous detachment         Fundus Exam       Right Left   Disc Pink and Sharp Pink and Sharp, mild temporal Peripapillary atrophy, mild PPP   C/D Ratio 0.5 0.4   Macula Flat, Blunted foveal reflex, RPE mottling and clumping, mild atrophy, Drusen, rare MA, focal IRH and edema / cystic changes temporal mac -- persistent Blunted foveal reflex, +CNV with pigment ring and surrounding edema -- stably improved, no heme, +drusen, RPE mottling   Vessels attenuated, Tortuous attenuated, mild tortuosity   Periphery Attached, scattered mild Reticular degeneration.  No heme. Attached, scattered Reticular degeneration, No heme.           IMAGING AND PROCEDURES  Imaging and Procedures for @TODAY @  OCT, Retina - OU - Both Eyes       Right Eye Quality was good. Central Foveal Thickness: 305. Progression has worsened. Findings include normal foveal contour, no SRF, myopic contour, retinal drusen , intraretinal fluid, outer retinal atrophy (persistent IRF/cystic changes IT macula and fovea -- slightly increased Patchy ORA - stable).   Left Eye Quality was good. Central Foveal Thickness: 268. Progression has been stable. Findings include no IRF, no SRF, abnormal foveal contour, retinal drusen , subretinal hyper-reflective material, intraretinal hyper-reflective material, pigment epithelial detachment, outer retinal atrophy (Persistent focal PED/CNV with stable improvement in overlying SRHM and retinal edema).   Notes *Images captured and stored on drive  Diagnosis / Impression: Mild DME OU OD: persistent IRF/cystic changes IT macula and fovea -- slightly increased; Patchy ORA -  stable OS: Exudative ARMD - Persistent focal PED/CNV with stable improvement in overlying SRHM and retinal edema -- nasal macula  Clinical management:  See below  Abbreviations: NFP - Normal foveal profile. CME - cystoid macular edema. PED - pigment epithelial detachment. IRF - intraretinal fluid. SRF - subretinal fluid. EZ - ellipsoid zone. ERM - epiretinal membrane. ORA - outer retinal atrophy. ORT - outer retinal tubulation. SRHM - subretinal hyper-reflective material              ASSESSMENT/PLAN:   ICD-10-CM   1. Exudative age-related macular degeneration of left eye with active choroidal neovascularization (HCC)  H35.3221 OCT, Retina - OU - Both Eyes    2. Intermediate stage nonexudative age-related macular degeneration of right eye  H35.3112     3. Both eyes affected by mild nonproliferative diabetic retinopathy with macular edema, associated with type 2 diabetes mellitus (HCC)  Z61.0960     4. Amblyopia of left eye  H53.002     5. Posterior vitreous detachment of left eye  H43.812     6. Pseudophakia of both eyes  Z96.1     7. Bilateral posterior capsular opacification  H26.493     8. Dry eyes  H04.123     9. 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, OS   - interval conversion to exudative ARMD noted on 7.26.22 - s/p IVA OS #1 (07.26.22), #2 (08.23.22), #3 (09.21.22), #4 (10.13.22), #5 (11.16.22), #6 (12.14.22), #7 (01.11.23), #8 (02.08.23), #9 (03.22.23), #10 (06.07.23), #11 (08.02.23), #12 (10.25.23), #13 (12.29.23)  - BCVA 20/250 OS (stable and at amblyopic baseline) - OCT shows OS: Persistent focal PED/CNV with stable improvement in overlying SRHM and retinal edema -- nasal macula -- at 5+ mos since last IVA OS - will hold off  on OS again today - pt in agreement - IVA informed consent obtained and signed, 07.26.22 (OS) - f/u in 6 wks, DFE/OCT, possible injxn  2. Intermediate Age related macular degeneration,  non-exudative, OD -- stable - exam/OCT today shows OD: Mild interval improvement in IRF/cystic changes IT macula and fovea; Patchy ORA - stable  - suspect cystic changes mostly related to DM and HTN  - BCVA OD 20/25 from 20/30  - FA 01.04.21 - no CNVM OU -- no exudative disease  - continue amsler grid monitoring  3. Mild non-proliferative diabetic retinopathy, both eyes - s/p IVA OD #1 (01.29.24), #2 (02.26.24), #3 (03.25.24), #4 (04.22.24), #5 (05.28.24) IVA resistance - exam shows rare MA/IRH - FA 01.04.21 - late leaking, perifoveal MA OU; no NV OU - OCT OD shows persistent IRF/cystic changes IT macula and fovea -- slightly improved; Patchy ORA - stable at 4 weeks - BCVA OD 20/25 -- stable **discussed decreased efficacy / resistance to Avastin and potential benefit of switching medication**  - recommend IVE OD #1 today, 07.09.24 with follow up extended to 4 weeks  - Pt wishes to proceed w/ injection - RBA of procedure discussed, questions answered - IVA informed consent obtained and signed 01.29.24 - IVE informed consent obtained and signed 07.09.24 - see procedure note  - f/u 4 wks for DFE, OCT, likely injection OD.   4. Severe amblyopia OS  - history of patching as a child -- no strabismus surgery - VA dropped down to 20/350 from 20/200 (baseline) due to conv to exu ARMD on 7.26.22  5. PVD / vitreous syneresis OS  - Discussed findings and prognosis  - No RT or RD on 360 scleral depressed exam  - Reviewed s/s of RT/RD  - Strict return precautions for any such RT/RD signs/symptoms  6. Pseudophakia OU  - s/p CE/IOL OU by [name]  - beautiful surgeries, doing well  - continue to monitor  7. PCO OU  - pt reports some fogginess of vision - s/p YAG Cap OD (01.15.24) -- good opening -- BCVA improved to 20/30 from 20/40 - recommend Yag Cap OS at some point.   8. Dry eyes OU  - persistnet PEE on slit lamp exam.   - cont artificial tears QID and lubricating ointment qhs as  needed  9. Ocular Hypertension OU  - IOP today, 19,21   - cont Cosopt BID OU  Ophthalmic Meds Ordered this visit:  No orders of the defined types were placed in this encounter.    No follow-ups on file.  There are no Patient Instructions on file for this visit.  This document serves as a record of services personally performed by Karie Chimera, MD, PhD. It was created on their behalf by Gerilyn Nestle, COT an ophthalmic technician. The creation of this record is the provider's dictation and/or activities during the visit.    Electronically signed by:  Charlette Caffey, COT  05/30/23 11:12 AM  This document serves as a record of services personally performed by Karie Chimera, MD, PhD. It was created on their behalf by Glee Arvin. Manson Passey, OA an ophthalmic technician. The creation of this record is the provider's dictation and/or activities during the visit.    Electronically signed by: Glee Arvin. Manson Passey, OA 05/30/23 11:12 AM    Karie Chimera, M.D., Ph.D. Diseases & Surgery of the Retina and Vitreous [facility]    Abbreviations: M myopia (nearsighted); A astigmatism; H hyperopia (farsighted); P  presbyopia; Mrx spectacle prescription;  CTL contact lenses; OD right eye; OS left eye; OU both eyes  XT exotropia; ET esotropia; PEK punctate epithelial keratitis; PEE punctate epithelial erosions; DES dry eye syndrome; MGD meibomian gland dysfunction; ATs artificial tears; PFAT's preservative free artificial tears; NSC nuclear sclerotic cataract; PSC posterior subcapsular cataract; ERM epi-retinal membrane; PVD posterior vitreous detachment; RD retinal detachment; DM diabetes mellitus; DR diabetic retinopathy; NPDR non-proliferative diabetic retinopathy; PDR proliferative diabetic retinopathy; CSME clinically significant macular edema; DME diabetic macular edema; dbh dot blot hemorrhages; CWS cotton wool spot; POAG primary open angle glaucoma; C/D cup-to-disc  ratio; HVF humphrey visual field; GVF goldmann visual field; OCT optical coherence tomography; IOP intraocular pressure; BRVO Branch retinal vein occlusion; CRVO central retinal vein occlusion; CRAO central retinal artery occlusion; BRAO branch retinal artery occlusion; RT retinal tear; SB scleral buckle; PPV pars plana vitrectomy; VH Vitreous hemorrhage; PRP panretinal laser photocoagulation; IVK intravitreal kenalog; VMT vitreomacular traction; MH Macular hole;  NVD neovascularization of the disc; NVE neovascularization elsewhere; AREDS age related eye disease study; ARMD age related macular degeneration; POAG primary open angle glaucoma; EBMD epithelial/anterior basement membrane dystrophy; ACIOL anterior chamber intraocular lens; IOL intraocular lens; PCIOL posterior chamber intraocular lens; Phaco/IOL phacoemulsification with intraocular lens placement; PRK photorefractive keratectomy; LASIK laser assisted in situ keratomileusis; HTN hypertension; DM diabetes mellitus; COPD chronic obstructive pulmonary disease

## 2023-05-30 ENCOUNTER — Encounter (INDEPENDENT_AMBULATORY_CARE_PROVIDER_SITE_OTHER): Payer: Self-pay | Admitting: Ophthalmology

## 2023-05-30 ENCOUNTER — Ambulatory Visit (INDEPENDENT_AMBULATORY_CARE_PROVIDER_SITE_OTHER): Payer: Medicare HMO | Admitting: Ophthalmology

## 2023-05-30 DIAGNOSIS — H26493 Other secondary cataract, bilateral: Secondary | ICD-10-CM

## 2023-05-30 DIAGNOSIS — H43812 Vitreous degeneration, left eye: Secondary | ICD-10-CM | POA: Diagnosis not present

## 2023-05-30 DIAGNOSIS — H04123 Dry eye syndrome of bilateral lacrimal glands: Secondary | ICD-10-CM | POA: Diagnosis not present

## 2023-05-30 DIAGNOSIS — Z961 Presence of intraocular lens: Secondary | ICD-10-CM | POA: Diagnosis not present

## 2023-05-30 DIAGNOSIS — H353221 Exudative age-related macular degeneration, left eye, with active choroidal neovascularization: Secondary | ICD-10-CM

## 2023-05-30 DIAGNOSIS — H353112 Nonexudative age-related macular degeneration, right eye, intermediate dry stage: Secondary | ICD-10-CM

## 2023-05-30 DIAGNOSIS — H40053 Ocular hypertension, bilateral: Secondary | ICD-10-CM

## 2023-05-30 DIAGNOSIS — E113213 Type 2 diabetes mellitus with mild nonproliferative diabetic retinopathy with macular edema, bilateral: Secondary | ICD-10-CM

## 2023-05-30 DIAGNOSIS — H53002 Unspecified amblyopia, left eye: Secondary | ICD-10-CM | POA: Diagnosis not present

## 2023-05-30 MED ORDER — AFLIBERCEPT 2MG/0.05ML IZ SOLN FOR KALEIDOSCOPE
2.00 mg | INTRAVITREAL | Status: AC | PRN
Start: 2023-05-30 — End: 2023-05-30
  Administered 2023-05-30: 2 mg via INTRAVITREAL

## 2023-06-02 ENCOUNTER — Other Ambulatory Visit: Payer: Self-pay | Admitting: Internal Medicine

## 2023-06-02 DIAGNOSIS — Z1231 Encounter for screening mammogram for malignant neoplasm of breast: Secondary | ICD-10-CM

## 2023-06-26 NOTE — Progress Notes (Signed)
[facility] - Clinic Note  07/03/2023     CHIEF COMPLAINT Patient presents for Retina Follow Up   HISTORY OF PRESENT ILLNESS: Monica Monica is a 82 y.o. female who presents to the clinic today for:   HPI     Retina Follow Up   Patient presents with  Diabetic Retinopathy.  In both eyes.  This started 4 weeks ago.  Duration of 4 weeks.  Since onset it is stable.  I, the attending physician,  performed the HPI with the patient and updated documentation appropriately.        Comments   4 week retina follow up NPDR and IVE OD pt is reporting no vision changes noticed she denies any flashes or floaters she is not checking her blood sugar at this time       Last edited by Rennis Chris, MD on 07/03/2023  1:34 PM.    Pt states she had no problems after her first IVE injection at last visit  Referring physician: Self-referral -- wife of Monica Monica  HISTORICAL INFORMATION:  Selected notes from the MEDICAL RECORD NUMBER Self referral Ocular Hx: pseudophakia OU Monica Monica 2019) PMH: DM (on metformin), HTN, arthritis,    CURRENT MEDICATIONS: Current Outpatient Medications (Ophthalmic Drugs)  Medication Sig   dorzolamide-timolol (COSOPT) 2-0.5 % ophthalmic solution Place 1 drop into both eyes 2 (two) times daily.   No current facility-administered medications for this visit. (Ophthalmic Drugs)   Current Outpatient Medications (Other)  Medication Sig   amLODipine (NORVASC) 5 MG tablet Take 5 mg by mouth daily.   aspirin 81 MG EC tablet Take 81 mg by mouth daily.    CINNAMON PO Take 1,000 mg by mouth daily.   gabapentin (NEURONTIN) 100 MG capsule gabapentin 100 mg capsule  TAKE 1 CAPSULE BY MOUTH ONCE DAILY AT BEDTIME FOR 30 DAYS   gabapentin (NEURONTIN) 300 MG capsule Take 600 mg by mouth at bedtime.   HYDROcodone-acetaminophen (NORCO) 7.5-325 MG tablet Take 1 tablet by mouth every 6 (six) hours as needed for severe pain ((score 7 to 10)).   metFORMIN  (GLUCOPHAGE) 1000 MG tablet Take 1,000 mg by mouth 2 (two) times daily.    methocarbamol (ROBAXIN) 500 MG tablet Take 1 tablet (500 mg total) by mouth every 6 (six) hours as needed for muscle spasms.   metoprolol succinate (TOPROL-XL) 50 MG 24 hr tablet Take 50 mg by mouth daily. Take with or immediately following a meal.   MODERNA COVID-19 VACCINE 100 MCG/0.5ML injection    Multiple Vitamins-Minerals (ICAPS AREDS 2) CAPS Take 1 capsule by mouth 2 (two) times daily.    rosuvastatin (CRESTOR) 20 MG tablet Take by mouth.   valsartan-hydrochlorothiazide (DIOVAN-HCT) 160-25 MG tablet Take 1 tablet by mouth daily.   No current facility-administered medications for this visit. (Other)   REVIEW OF SYSTEMS: ROS   Positive for: Musculoskeletal, Endocrine, Cardiovascular, Eyes Negative for: Constitutional, Gastrointestinal, Neurological, Skin, Genitourinary, HENT, Respiratory, Psychiatric, Allergic/Imm, Heme/Lymph Last edited by Etheleen Mayhew, COT on 07/03/2023 12:53 PM.        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   Past Surgical History:  Procedure Laterality Date   ABDOMINAL HYSTERECTOMY     APPENDECTOMY     BLADDER SUSPENSION     BREAST BIOPSY Left    BREAST EXCISIONAL BIOPSY Left    CATARACT EXTRACTION     CHOLECYSTECTOMY     DILATION AND CURETTAGE OF UTERUS     EYE SURGERY     HEAD & NECK SKIN LESION EXCISIONAL BIOPSY     top left scalp   LUMBAR  LAMINECTOMY/DECOMPRESSION MICRODISCECTOMY N/A 08/21/2019   Procedure: Lumbar decompression L3-4 right, microdiscectomy L3-4 right;  Surgeon: Ranee Gosselin, MD;  Location: [facility];  Service: Orthopedics;  Laterality: N/A;    SHOULDER ARTHROSCOPY WITH SUBACROMIAL DECOMPRESSION Right 01/30/2013   Procedure: SHOULDER ARTHROSCOPY WITH SUBACROMIAL DECOMPRESSION;  Surgeon: Drucilla Schmidt, MD;  Location: [facility];  Service: Orthopedics;  Laterality: Right;  with labral debridement   FAMILY HISTORY Family History  Problem Relation Age of Onset   Heart disease Father    Breast cancer Maternal Aunt    Diabetes Brother    Leukemia Sister    Colon cancer Neg Hx    Esophageal cancer Neg Hx    Rectal cancer Neg Hx    Stomach cancer Neg Hx    SOCIAL HISTORY Social History   Tobacco Use   Smoking status: Never   Smokeless tobacco: Never  Vaping Use   Vaping status: Never Used  Substance Use Topics   Alcohol use: No   Drug use: No       OPHTHALMIC EXAM: Base Eye Exam     Visual Acuity (Snellen - Linear)       Right Left   Dist [place] 20/40 20/300   Dist ph [place] NI 20/250 -1         Tonometry (Tonopen, 1:00 PM)       Right Left   Pressure 15 18         Pupils       Pupils Dark Light Shape React APD   Right PERRL 3 2 Round Brisk None   Left PERRL 3 2 Round Brisk None         Visual Fields       Left Right    Full Full         Extraocular Movement       Right Left    Full, Ortho Full, Ortho         Neuro/Psych     Oriented x3: Yes   Mood/Affect: Normal         Dilation     Both eyes: 2.5% Phenylephrine, 1.0% Mydriacyl @ 1:00 PM           Slit Lamp and Fundus Exam     Slit Lamp Exam       Right Left   Lids/Lashes Dermatochalasis - upper lid, mild Meibomian gland dysfunction Dermatochalasis - upper lid, Meibomian gland dysfunction   Conjunctiva/Sclera nasal and temporal pinguecula Nasal Pinguecula   Cornea tear film debris, 3+ PEE, mild  arcus, well healed cataract wound, decreased TBUT 3+ Punctate epithelial erosions, Arcus, Temporal Well healed cataract wounds, trace Debris in tear film, decreased TBUT   Anterior Chamber Deep and quiet Deep and quiet  Iris Poorly dilated Round and dilated   Lens PC IOL in good position, Open posterior capsule PC IOL in good position, 1-2+Posterior capsular opacification   Anterior Vitreous Vitreous syneresis, Posterior vitreous detachment, vitreous condensations Vitreous syneresis, Posterior vitreous detachment         Fundus Exam       Right Left   Disc Pink and Sharp Pink and Sharp, mild temporal Peripapillary atrophy, mild PPP   C/D Ratio 0.5 0.4   Macula Flat, Blunted foveal reflex, RPE mottling and clumping, mild atrophy, Drusen, rare MA, focal IRH and edema / cystic changes temporal mac -- slightly improved Blunted foveal reflex, +CNV with pigment ring -- no surrounding edema -- stably improved, no heme, +drusen, RPE mottling   Vessels attenuated, Tortuous attenuated, Tortuous   Periphery Attached, scattered mild Reticular degeneration.  No heme. Attached, scattered Reticular degeneration, No heme.           IMAGING AND PROCEDURES  Imaging and Procedures for @TODAY @  OCT, Retina - OU - Both Eyes       Right Eye Quality was borderline. Central Foveal Thickness: 297. Progression has improved. Findings include normal foveal contour, no SRF, myopic contour, retinal drusen , intraretinal fluid, outer retinal atrophy (Mild interval improvement in IRF/cystic changes IT macula and fovea, Patchy ORA -- stable).   Left Eye Quality was borderline. Central Foveal Thickness: 266. Progression has been stable. Findings include no IRF, no SRF, abnormal foveal contour, retinal drusen , subretinal hyper-reflective material, intraretinal hyper-reflective material, pigment epithelial detachment, outer retinal atrophy (Persistent focal PED/CNV with stable improvement in overlying SRHM and  retinal edema).   Notes *Images captured and stored on drive  Diagnosis / Impression: Mild DME OU OD: Mild interval improvement in IRF/cystic changes IT macula and fovea, Patchy ORA -- stable OS: Exudative ARMD - Persistent focal PED/CNV with stable improvement in overlying SRHM and retinal edema -- nasal macula  Clinical management:  See below  Abbreviations: NFP - Normal foveal profile. CME - cystoid macular edema. PED - pigment epithelial detachment. IRF - intraretinal fluid. SRF - subretinal fluid. EZ - ellipsoid zone. ERM - epiretinal membrane. ORA - outer retinal atrophy. ORT - outer retinal tubulation. SRHM - subretinal hyper-reflective material       Intravitreal Injection, Pharmacologic Agent - OD - Right Eye       Time Out 07/03/2023. 1:22 PM. Confirmed correct patient, procedure, site, and patient consented.   Anesthesia Topical anesthesia was used. Anesthetic medications included Lidocaine 2%, Proparacaine 0.5%.   Procedure Preparation included 5% betadine to ocular surface, eyelid speculum. A (32g) needle was used.   Injection: 2 mg aflibercept 2 MG/0.05ML   Route: Intravitreal, Site: Right Eye   NDC: [id number], Lot: [id number], Expiration date: 02/19/2024, Waste: 0 mL   Post-op Post injection exam found visual acuity of at least counting fingers. The patient tolerated the procedure well. There were no complications. The patient received written and verbal post procedure care education. Post injection medications were not given.            ASSESSMENT/PLAN:   ICD-10-CM   1. Exudative age-related macular degeneration of left eye with active choroidal neovascularization (HCC)  H35.3221 OCT, Retina - OU - Both Eyes    2. Intermediate stage nonexudative age-related macular degeneration of right eye  H35.3112     3. Both eyes affected by mild nonproliferative diabetic retinopathy with macular edema, associated with type 2 diabetes mellitus (HCC)  I34.7425  OCT, Retina - OU -  Both Eyes    Intravitreal Injection, Pharmacologic Agent - OD - Right Eye    aflibercept (EYLEA) SOLN 2 mg    4. Amblyopia of left eye  H53.002     5. Posterior vitreous detachment of left eye  H43.812     6. Pseudophakia of both eyes  Z96.1     7. Bilateral posterior capsular opacification  H26.493     8. Dry eyes  H04.123       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, OS   - interval conversion to exudative ARMD noted on 7.26.22 - s/p IVA OS #1 (07.26.22), #2 (08.23.22), #3 (09.21.22), #4 (10.13.22), #5 (11.16.22), #6 (12.14.22), #7 (01.11.23), #8 (02.08.23), #9 (03.22.23), #10 (06.07.23), #11 (08.02.23), #12 (10.25.23), #13 (12.29.23)  - BCVA 20/250 OS (stable and at amblyopic baseline) - OCT shows OS: Persistent focal PED/CNV with stable improvement in overlying SRHM and retinal edema -- nasal macula -- at ~8 mos since last IVA OS - will hold off on OS again today - pt in agreement - IVA informed consent obtained and signed, 07.26.22 (OS) - f/u in 4 wks, DFE/OCT, possible injxn  2. Intermediate Age related macular degeneration, non-exudative, OD -- stable - exam/OCT today shows OD: persistent IRF/cystic changes IT macula and fovea -- slightly increased; Patchy ORA - stable  - suspect cystic changes mostly related to DM and HTN  - BCVA OD 20/30  - FA 01.04.21 - no CNVM OU -- no exudative disease  - continue amsler grid monitoring  3. Mild non-proliferative diabetic retinopathy, both eyes - s/p IVA OD #1 (01.29.24), #2 (02.26.24), #3 (03.25.24), #4 (04.22.24), #5 (05.28.24) -- IVA resistance - s/p IVE OD #1 (07.09.24) - exam shows rare MA/IRH - FA 01.04.21 - late leaking, perifoveal MA OU; no NV OU - OCT OD shows persistent IRF/cystic changes IT macula and fovea -- slightly improved; Patchy ORA - stable at 5 wks - BCVA OD 20/25 -- stable  - recommend IVE OD #2 today, 08.12.24 with follow up in 4 weeks  - Pt wishes to proceed w/ injection - RBA of  procedure discussed, questions answered - IVE informed consent obtained and signed 07.09.24 - see procedure note  - f/u 4 wks for DFE, OCT, likely injection OD.   4. Severe amblyopia OS  - history of patching as a child -- no strabismus surgery - VA dropped down to 20/350 from 20/200 (baseline) due to conv to exu ARMD on 7.26.22  5. PVD / vitreous syneresis OS  - Discussed findings and prognosis  - No RT or RD on 360 scleral depressed exam  - Reviewed s/s of RT/RD  - Strict return precautions for any such RT/RD signs/symptoms  6. Pseudophakia OU  - s/p CE/IOL OU by Monica Monica  - beautiful surgeries, doing well  - continue to monitor  7. PCO OU  - pt reports some fogginess of vision - s/p YAG Cap OD (01.15.24) -- good opening -- BCVA improved to 20/30 from 20/40 - recommend Yag Cap OS at some point.   8. Dry eyes OU  - persistent PEE on slit lamp exam.   - cont artificial tears QID and lubricating ointment qhs as needed -- pt not using any  9. Ocular Hypertension OU  - IOP today 15,18   - cont Cosopt BID OU  Ophthalmic Meds Ordered this visit:  Meds ordered this encounter  Medications   aflibercept (EYLEA) SOLN 2 mg     Return in about 4 weeks (around  07/31/2023) for f/u NPDR OU, DFE, OCT.  There are no Patient Instructions on file for this visit.  This document serves as a record of services personally performed by Karie Chimera, MD, PhD. It was created on their behalf by De Blanch, an ophthalmic technician. The creation of this record is the provider's dictation and/or activities during the visit.    Electronically signed by: De Blanch, OA, 07/03/23  2:06 PM  This document serves as a record of services personally performed by Karie Chimera, MD, PhD. It was created on their behalf by Glee Arvin. Manson Passey, OA an ophthalmic technician. The creation of this record is the provider's dictation and/or activities during the visit.    Electronically signed by: Glee Arvin. Manson Passey, OA 07/03/23 2:06 PM  Karie Chimera, M.D., Ph.D. Diseases & Surgery of the Retina and Vitreous [facility]  I have reviewed the above documentation for accuracy and completeness, and I agree with the above. Karie Chimera, M.D., Ph.D. 07/03/23 2:07 PM    Abbreviations: M myopia (nearsighted); A astigmatism; H hyperopia (farsighted); P presbyopia; Mrx spectacle prescription;  CTL contact lenses; OD right eye; OS left eye; OU both eyes  XT exotropia; ET esotropia; PEK punctate epithelial keratitis; PEE punctate epithelial erosions; DES dry eye syndrome; MGD meibomian gland dysfunction; ATs artificial tears; PFAT's preservative free artificial tears; NSC nuclear sclerotic cataract; PSC posterior subcapsular cataract; ERM epi-retinal membrane; PVD posterior vitreous detachment; RD retinal detachment; DM diabetes mellitus; DR diabetic retinopathy; NPDR non-proliferative diabetic retinopathy; PDR proliferative diabetic retinopathy; CSME clinically significant macular edema; DME diabetic macular edema; dbh dot blot hemorrhages; CWS cotton wool spot; POAG primary open angle glaucoma; C/D cup-to-disc ratio; HVF humphrey visual field; GVF goldmann visual field; OCT optical coherence tomography; IOP intraocular pressure; BRVO Branch retinal vein occlusion; CRVO central retinal vein occlusion; CRAO central retinal artery occlusion; BRAO branch retinal artery occlusion; RT retinal tear; SB scleral buckle; PPV pars plana vitrectomy; VH Vitreous hemorrhage; PRP panretinal laser photocoagulation; IVK intravitreal kenalog; VMT vitreomacular traction; MH Macular hole;  NVD neovascularization of the disc; NVE neovascularization elsewhere; AREDS age related eye disease study; ARMD age related macular degeneration; POAG primary open angle glaucoma; EBMD epithelial/anterior basement membrane dystrophy; ACIOL anterior chamber intraocular lens; IOL intraocular lens; PCIOL posterior chamber  intraocular lens; Phaco/IOL phacoemulsification with intraocular lens placement; PRK photorefractive keratectomy; LASIK laser assisted in situ keratomileusis; HTN hypertension; DM diabetes mellitus; COPD chronic obstructive pulmonary disease

## 2023-07-03 ENCOUNTER — Encounter (INDEPENDENT_AMBULATORY_CARE_PROVIDER_SITE_OTHER): Payer: Self-pay | Admitting: Ophthalmology

## 2023-07-03 ENCOUNTER — Ambulatory Visit (INDEPENDENT_AMBULATORY_CARE_PROVIDER_SITE_OTHER): Payer: Medicare HMO | Admitting: Ophthalmology

## 2023-07-03 DIAGNOSIS — E113213 Type 2 diabetes mellitus with mild nonproliferative diabetic retinopathy with macular edema, bilateral: Secondary | ICD-10-CM | POA: Diagnosis not present

## 2023-07-03 DIAGNOSIS — H353112 Nonexudative age-related macular degeneration, right eye, intermediate dry stage: Secondary | ICD-10-CM | POA: Diagnosis not present

## 2023-07-03 DIAGNOSIS — H04123 Dry eye syndrome of bilateral lacrimal glands: Secondary | ICD-10-CM | POA: Diagnosis not present

## 2023-07-03 DIAGNOSIS — H353221 Exudative age-related macular degeneration, left eye, with active choroidal neovascularization: Secondary | ICD-10-CM | POA: Diagnosis not present

## 2023-07-03 DIAGNOSIS — H26493 Other secondary cataract, bilateral: Secondary | ICD-10-CM

## 2023-07-03 DIAGNOSIS — H43812 Vitreous degeneration, left eye: Secondary | ICD-10-CM

## 2023-07-03 DIAGNOSIS — Z961 Presence of intraocular lens: Secondary | ICD-10-CM

## 2023-07-03 DIAGNOSIS — H53002 Unspecified amblyopia, left eye: Secondary | ICD-10-CM

## 2023-07-03 MED ORDER — AFLIBERCEPT 2MG/0.05ML IZ SOLN FOR KALEIDOSCOPE
2.0000 mg | INTRAVITREAL | Status: AC | PRN
Start: 2023-07-03 — End: 2023-07-03
  Administered 2023-07-03: 2 mg via INTRAVITREAL

## 2023-07-14 ENCOUNTER — Ambulatory Visit: Admission: RE | Admit: 2023-07-14 | Payer: Medicare HMO | Source: Ambulatory Visit

## 2023-07-14 DIAGNOSIS — Z1231 Encounter for screening mammogram for malignant neoplasm of breast: Secondary | ICD-10-CM

## 2023-07-28 NOTE — Progress Notes (Signed)
[facility] - Clinic Note  07/31/2023     CHIEF COMPLAINT Patient presents for Retina Follow Up   HISTORY OF PRESENT ILLNESS: Monica Hamilton is a 82 y.o. female who presents to the clinic today for:   HPI     Retina Follow Up   Patient presents with  Diabetic Retinopathy.  In both eyes.  This started 4 weeks ago.  I, the attending physician,  performed the HPI with the patient and updated documentation appropriately.        Comments   Patient here for 4 weeks retina follow up for NPDR OU. Patient states vision doing ok. No eye pain. After last injection has floaters. Still has them.      Last edited by Rennis Chris, MD on 07/31/2023  6:12 PM.    Pt states vision is the same, she feels like she sees more floaters with Eylea than with Avastin  Referring physician: Self-referral -- wife of Rennette Toure  HISTORICAL INFORMATION:  Selected notes from the MEDICAL RECORD NUMBER Self referral Ocular Hx: pseudophakia OU [name] 2019) PMH: DM (on metformin), HTN, arthritis,    CURRENT MEDICATIONS: Current Outpatient Medications (Ophthalmic Drugs)  Medication Sig   dorzolamide-timolol (COSOPT) 2-0.5 % ophthalmic solution Place 1 drop into both eyes 2 (two) times daily.   No current facility-administered medications for this visit. (Ophthalmic Drugs)   Current Outpatient Medications (Other)  Medication Sig   amLODipine (NORVASC) 5 MG tablet Take 5 mg by mouth daily.   aspirin 81 MG EC tablet Take 81 mg by mouth daily.    CINNAMON PO Take 1,000 mg by mouth daily.   gabapentin (NEURONTIN) 100 MG capsule gabapentin 100 mg capsule  TAKE 1 CAPSULE BY MOUTH ONCE DAILY AT BEDTIME FOR 30 DAYS   gabapentin (NEURONTIN) 300 MG capsule Take 600 mg by mouth at bedtime.   HYDROcodone-acetaminophen (NORCO) 7.5-325 MG tablet Take 1 tablet by mouth every 6 (six) hours as needed for severe pain ((score 7 to 10)).   metFORMIN (GLUCOPHAGE) 1000 MG tablet Take 1,000 mg by mouth  2 (two) times daily.    methocarbamol (ROBAXIN) 500 MG tablet Take 1 tablet (500 mg total) by mouth every 6 (six) hours as needed for muscle spasms.   metoprolol succinate (TOPROL-XL) 50 MG 24 hr tablet Take 50 mg by mouth daily. Take with or immediately following a meal.   MODERNA COVID-19 VACCINE 100 MCG/0.5ML injection    Multiple Vitamins-Minerals (ICAPS AREDS 2) CAPS Take 1 capsule by mouth 2 (two) times daily.    rosuvastatin (CRESTOR) 20 MG tablet Take by mouth.   valsartan-hydrochlorothiazide (DIOVAN-HCT) 160-25 MG tablet Take 1 tablet by mouth daily.   No current facility-administered medications for this visit. (Other)   REVIEW OF SYSTEMS: ROS   Positive for: Musculoskeletal, Endocrine, Cardiovascular, Eyes Negative for: Constitutional, Gastrointestinal, Neurological, Skin, Genitourinary, HENT, Respiratory, Psychiatric, Allergic/Imm, Heme/Lymph Last edited by Laddie Aquas, COA on 07/31/2023 12:58 PM.     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   Past Surgical History:  Procedure Laterality Date   ABDOMINAL HYSTERECTOMY     APPENDECTOMY     BLADDER SUSPENSION     BREAST BIOPSY Left    BREAST EXCISIONAL BIOPSY Left    CATARACT EXTRACTION     CHOLECYSTECTOMY     DILATION AND CURETTAGE OF UTERUS     EYE SURGERY     HEAD & NECK SKIN LESION EXCISIONAL BIOPSY     top left scalp   LUMBAR LAMINECTOMY/DECOMPRESSION MICRODISCECTOMY N/A 08/21/2019   Procedure: Lumbar  decompression L3-4 right, microdiscectomy L3-4 right;  Surgeon: Ranee Gosselin, MD;  Location: [facility];  Service: Orthopedics;  Laterality: N/A;    SHOULDER ARTHROSCOPY WITH SUBACROMIAL DECOMPRESSION Right 01/30/2013   Procedure: SHOULDER ARTHROSCOPY WITH SUBACROMIAL DECOMPRESSION;  Surgeon: Drucilla Schmidt, MD;  Location: [facility];  Service: Orthopedics;  Laterality: Right;  with labral debridement   FAMILY HISTORY Family History  Problem Relation Age of Onset   Heart disease Father    Breast cancer Maternal Aunt    Diabetes Brother    Leukemia Sister    Colon cancer Neg Hx    Esophageal cancer Neg Hx    Rectal cancer Neg Hx    Stomach cancer Neg Hx    SOCIAL HISTORY Social History   Tobacco Use   Smoking status: Never   Smokeless tobacco: Never  Vaping Use   Vaping status: Never Used  Substance Use Topics   Alcohol use: No   Drug use: No       OPHTHALMIC EXAM: Base Eye Exam     Visual Acuity (Snellen - Linear)       Right Left   Dist [place] 20/30 -2 20/250 -1   Dist ph   20/250 +1         Tonometry (Tonopen, 12:56 PM)       Right Left   Pressure 14 15         Pupils       Dark Light Shape React APD   Right 3 2 Round Brisk None   Left 3 2 Round Brisk None         Visual Fields (Counting fingers)       Left Right    Full Full         Extraocular Movement       Right Left    Full, Ortho Full, Ortho         Neuro/Psych     Oriented x3: Yes   Mood/Affect: Normal         Dilation     Both eyes: 1.0% Mydriacyl, 2.5% Phenylephrine @ 12:56 PM           Slit Lamp and Fundus Exam     Slit Lamp Exam       Right Left   Lids/Lashes Dermatochalasis - upper lid, mild Meibomian gland dysfunction Dermatochalasis - upper lid, Meibomian gland dysfunction   Conjunctiva/Sclera nasal and temporal pinguecula Nasal Pinguecula   Cornea tear film debris, 3+ PEE, mild arcus, well healed cataract wound, decreased TBUT 3+ Punctate epithelial  erosions, Arcus, Temporal Well healed cataract wounds, trace Debris in tear film, decreased TBUT   Anterior Chamber Deep and quiet Deep and quiet   Iris Poorly dilated Round and dilated   Lens PC IOL in good position, Open posterior  capsule PC IOL in good position, 1-2+Posterior capsular opacification   Anterior Vitreous Vitreous syneresis, Posterior vitreous detachment, vitreous condensations Vitreous syneresis, Posterior vitreous detachment         Fundus Exam       Right Left   Disc Pink and Sharp Pink and Sharp, mild temporal Peripapillary atrophy, mild PPP   C/D Ratio 0.5 0.4   Macula Flat, Blunted foveal reflex, RPE mottling and clumping, mild atrophy, Drusen, rare MA, focal IRH and edema / cystic changes temporal mac -- slightly improved Blunted foveal reflex, +CNV with pigment ring -- no surrounding edema -- stably improved, no heme, +drusen, RPE mottling   Vessels attenuated, Tortuous attenuated, Tortuous   Periphery Attached, scattered mild Reticular degeneration, no heme Attached, scattered Reticular degeneration, No heme.           IMAGING AND PROCEDURES  Imaging and Procedures for @TODAY @  OCT, Retina - OU - Both Eyes       Right Eye Quality was good. Central Foveal Thickness: 292. Progression has improved. Findings include normal foveal contour, no SRF, myopic contour, retinal drusen , intraretinal fluid, outer retinal atrophy (Mild interval improvement in IRF/cystic changes IT macula and fovea, Patchy ORA -- stable).   Left Eye Quality was good. Central Foveal Thickness: 265. Progression has been stable. Findings include no IRF, no SRF, abnormal foveal contour, retinal drusen , subretinal hyper-reflective material, intraretinal hyper-reflective material, pigment epithelial detachment, outer retinal atrophy (Persistent focal PED/CNV with stable improvement in overlying SRHM and retinal edema).   Notes *Images captured and stored on drive  Diagnosis /  Impression: Mild DME OU OD: Mild interval improvement in IRF/cystic changes IT macula and fovea, Patchy ORA -- stable OS: Exudative ARMD - Persistent focal PED/CNV with stable improvement in overlying SRHM and retinal edema -- nasal macula  Clinical management:  See below  Abbreviations: NFP - Normal foveal profile. CME - cystoid macular edema. PED - pigment epithelial detachment. IRF - intraretinal fluid. SRF - subretinal fluid. EZ - ellipsoid zone. ERM - epiretinal membrane. ORA - outer retinal atrophy. ORT - outer retinal tubulation. SRHM - subretinal hyper-reflective material       Intravitreal Injection, Pharmacologic Agent - OD - Right Eye       Time Out 07/31/2023. 1:46 PM. Confirmed correct patient, procedure, site, and patient consented.   Anesthesia Topical anesthesia was used. Anesthetic medications included Lidocaine 2%, Proparacaine 0.5%.   Procedure Preparation included 5% betadine to ocular surface, eyelid speculum. A (32g) needle was used.   Injection: 2 mg aflibercept 2 MG/0.05ML   Route: Intravitreal, Site: Right Eye   NDC: L6038910, Lot: [id number], Expiration date: 10/20/2024, Waste: 0 mL   Post-op Post injection exam found visual acuity of at least counting fingers. The patient tolerated the procedure well. There were no complications. The patient received written and verbal post procedure care education. Post injection medications were not given.            ASSESSMENT/PLAN:   ICD-10-CM   1. Exudative age-related macular degeneration of left eye with active choroidal neovascularization (HCC)  H35.3221 OCT, Retina - OU - Both Eyes    2. Intermediate stage nonexudative age-related macular degeneration of right eye  H35.3112     3. Both eyes affected by mild nonproliferative diabetic retinopathy with macular edema, associated with type 2 diabetes mellitus (HCC)  U98.1191 Intravitreal Injection, Pharmacologic Agent - OD - Right Eye    aflibercept  (EYLEA) SOLN 2 mg    4. Long  term (current) use of oral hypoglycemic drugs  Z79.84     5. Amblyopia of left eye  H53.002     6. Posterior vitreous detachment of left eye  H43.812     7. Pseudophakia of both eyes  Z96.1     8. Bilateral posterior capsular opacification  H26.493     9. 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, OS   - interval conversion to exudative ARMD noted on 7.26.22 - s/p IVA OS #1 (07.26.22), #2 (08.23.22), #3 (09.21.22), #4 (10.13.22), #5 (11.16.22), #6 (12.14.22), #7 (01.11.23), #8 (02.08.23), #9 (03.22.23), #10 (06.07.23), #11 (08.02.23), #12 (10.25.23), #13 (12.29.23)  - BCVA 20/250 OS (stable and at amblyopic baseline) - OCT shows OS: Persistent focal PED/CNV with stable improvement in overlying SRHM and retinal edema -- nasal macula -- at ~9 mos since last IVA OS - will hold off on OS again today - pt in agreement - IVA informed consent obtained and signed, 07.26.22 (OS) - f/u in 4 wks, DFE/OCT, possible injxn  2. 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 Mild non-proliferative diabetic retinopathy, both eyes - s/p IVA OD #1 (01.29.24), #2 (02.26.24), #3 (03.25.24), #4 (04.22.24), #5 (05.28.24) -- IVA resistance - s/p IVE OD #1 (07.09.24), #2 (08.12.24) - exam shows rare MA/IRH - FA 01.04.21 - late leaking, perifoveal MA OU; no NV OU - OCT OD shows persistent IRF/cystic changes IT macula and fovea -- slightly improved; Patchy ORA - stable at 4 wks - BCVA OD 20/25 from 20/30  - recommend IVE OD #3 today, 09.09.24 with follow up in 4 weeks  - Pt wishes to proceed  w/ injection - RBA of procedure discussed, questions answered - IVE informed consent obtained and signed 07.09.24 - see procedure note  - f/u 4 weeks DFE, OCT, likely injection OD  5. Severe amblyopia OS  - history of patching as a child -- no strabismus surgery - VA dropped down to 20/350 from 20/200 (baseline) due to conv to exu ARMD on 7.26.22  6. PVD / vitreous syneresis OS  - Discussed findings and prognosis  - No RT or RD on 360 scleral depressed exam  - Reviewed s/s of RT/RD  - strict return precautions for any such RT/RD symptoms  7. Pseudophakia OU  - s/p CE/IOL OU by [name]  - beautiful surgeries, doing well  - continue to monitor  8. PCO OU  - pt reports some fogginess of vision - s/p YAG Cap OD (01.15.24) -- good opening -- BCVA improved to 20/30 from 20/40 - recommend yag cap OS at some point  9. Dry eyes OU  - persistent PEE on slit lamp exam.   - cont artificial tears QID and lubricating ointment qhs as needed -- pt not using any  10. Ocular Hypertension OU  - IOP today 14,15  - continue cosopt BID OU  Ophthalmic Meds Ordered this visit:  Meds ordered this encounter  Medications   aflibercept (EYLEA) SOLN 2 mg     Return in about 4 weeks (around 08/28/2023) for f/u exu NPDR  OD, DFE, OCT.  There are no Patient Instructions on file for this visit.  This document serves as a record of services personally performed by Karie Chimera, MD, PhD. It was created on their behalf by Annalee Genta, COMT. The creation of this record is the provider's dictation and/or activities during the visit.  Electronically signed by: Annalee Genta, COMT 07/31/23 6:14 PM  Karie Chimera, M.D., Ph.D. Diseases & Surgery of the Retina and Vitreous [facility]  I have reviewed the above documentation for accuracy and completeness, and I agree with the above. Karie Chimera, M.D., Ph.D. 07/31/23 6:14 PM  Abbreviations: M myopia (nearsighted); A  astigmatism; H hyperopia (farsighted); P presbyopia; Mrx spectacle prescription;  CTL contact lenses; OD right eye; OS left eye; OU both eyes  XT exotropia; ET esotropia; PEK punctate epithelial keratitis; PEE punctate epithelial erosions; DES dry eye syndrome; MGD meibomian gland dysfunction; ATs artificial tears; PFAT's preservative free artificial tears; NSC nuclear sclerotic cataract; PSC posterior subcapsular cataract; ERM epi-retinal membrane; PVD posterior vitreous detachment; RD retinal detachment; DM diabetes mellitus; DR diabetic retinopathy; NPDR non-proliferative diabetic retinopathy; PDR proliferative diabetic retinopathy; CSME clinically significant macular edema; DME diabetic macular edema; dbh dot blot hemorrhages; CWS cotton wool spot; POAG primary open angle glaucoma; C/D cup-to-disc ratio; HVF humphrey visual field; GVF goldmann visual field; OCT optical coherence tomography; IOP intraocular pressure; BRVO Branch retinal vein occlusion; CRVO central retinal vein occlusion; CRAO central retinal artery occlusion; BRAO branch retinal artery occlusion; RT retinal tear; SB scleral buckle; PPV pars plana vitrectomy; VH Vitreous hemorrhage; PRP panretinal laser photocoagulation; IVK intravitreal kenalog; VMT vitreomacular traction; MH Macular hole;  NVD neovascularization of the disc; NVE neovascularization elsewhere; AREDS age related eye disease study; ARMD age related macular degeneration; POAG primary open angle glaucoma; EBMD epithelial/anterior basement membrane dystrophy; ACIOL anterior chamber intraocular lens; IOL intraocular lens; PCIOL posterior chamber intraocular lens; Phaco/IOL phacoemulsification with intraocular lens placement; PRK photorefractive keratectomy; LASIK laser assisted in situ keratomileusis; HTN hypertension; DM diabetes mellitus; COPD chronic obstructive pulmonary disease

## 2023-07-31 ENCOUNTER — Ambulatory Visit (INDEPENDENT_AMBULATORY_CARE_PROVIDER_SITE_OTHER): Payer: Medicare HMO | Admitting: Ophthalmology

## 2023-07-31 ENCOUNTER — Encounter (INDEPENDENT_AMBULATORY_CARE_PROVIDER_SITE_OTHER): Payer: Self-pay | Admitting: Ophthalmology

## 2023-07-31 DIAGNOSIS — H53002 Unspecified amblyopia, left eye: Secondary | ICD-10-CM

## 2023-07-31 DIAGNOSIS — H353112 Nonexudative age-related macular degeneration, right eye, intermediate dry stage: Secondary | ICD-10-CM

## 2023-07-31 DIAGNOSIS — H04123 Dry eye syndrome of bilateral lacrimal glands: Secondary | ICD-10-CM

## 2023-07-31 DIAGNOSIS — Z7984 Long term (current) use of oral hypoglycemic drugs: Secondary | ICD-10-CM

## 2023-07-31 DIAGNOSIS — Z961 Presence of intraocular lens: Secondary | ICD-10-CM | POA: Diagnosis not present

## 2023-07-31 DIAGNOSIS — H43812 Vitreous degeneration, left eye: Secondary | ICD-10-CM

## 2023-07-31 DIAGNOSIS — H26493 Other secondary cataract, bilateral: Secondary | ICD-10-CM

## 2023-07-31 DIAGNOSIS — E113213 Type 2 diabetes mellitus with mild nonproliferative diabetic retinopathy with macular edema, bilateral: Secondary | ICD-10-CM

## 2023-07-31 DIAGNOSIS — H353221 Exudative age-related macular degeneration, left eye, with active choroidal neovascularization: Secondary | ICD-10-CM

## 2023-07-31 DIAGNOSIS — H40053 Ocular hypertension, bilateral: Secondary | ICD-10-CM | POA: Diagnosis not present

## 2023-07-31 MED ORDER — AFLIBERCEPT 2MG/0.05ML IZ SOLN FOR KALEIDOSCOPE
2.0000 mg | INTRAVITREAL | Status: AC | PRN
Start: 2023-07-31 — End: 2023-07-31
  Administered 2023-07-31: 2 mg via INTRAVITREAL

## 2023-08-25 NOTE — Progress Notes (Incomplete)
[facility] - Clinic Note  08/28/2023     CHIEF COMPLAINT Patient presents for No chief complaint on file.   HISTORY OF PRESENT ILLNESS: Monica Hamilton is a 82 y.o. female who presents to the clinic today for:    Pt states vision is the same, she feels like she sees more floaters with Eylea than with Avastin  Referring physician: Self-referral -- wife of Letitia Sabala  HISTORICAL INFORMATION:  Selected notes from the MEDICAL RECORD NUMBER Self referral Ocular Hx: pseudophakia OU [name] 2019) PMH: DM (on metformin), HTN, arthritis,    CURRENT MEDICATIONS: Current Outpatient Medications (Ophthalmic Drugs)  Medication Sig   dorzolamide-timolol (COSOPT) 2-0.5 % ophthalmic solution Place 1 drop into both eyes 2 (two) times daily.   No current facility-administered medications for this visit. (Ophthalmic Drugs)   Current Outpatient Medications (Other)  Medication Sig   amLODipine (NORVASC) 5 MG tablet Take 5 mg by mouth daily.   aspirin 81 MG EC tablet Take 81 mg by mouth daily.    CINNAMON PO Take 1,000 mg by mouth daily.   gabapentin (NEURONTIN) 100 MG capsule gabapentin 100 mg capsule  TAKE 1 CAPSULE BY MOUTH ONCE DAILY AT BEDTIME FOR 30 DAYS   gabapentin (NEURONTIN) 300 MG capsule Take 600 mg by mouth at bedtime.   HYDROcodone-acetaminophen (NORCO) 7.5-325 MG tablet Take 1 tablet by mouth every 6 (six) hours as needed for severe pain ((score 7 to 10)).   metFORMIN (GLUCOPHAGE) 1000 MG tablet Take 1,000 mg by mouth 2 (two) times daily.    methocarbamol (ROBAXIN) 500 MG tablet Take 1 tablet (500 mg total) by mouth every 6 (six) hours as needed for muscle spasms.   metoprolol succinate (TOPROL-XL) 50 MG 24 hr tablet Take 50 mg by mouth daily. Take with or immediately following a meal.   MODERNA COVID-19 VACCINE 100 MCG/0.5ML injection    Multiple Vitamins-Minerals (ICAPS AREDS 2) CAPS Take 1 capsule by mouth 2 (two) times daily.    rosuvastatin (CRESTOR)  20 MG tablet Take by mouth.   valsartan-hydrochlorothiazide (DIOVAN-HCT) 160-25 MG tablet Take 1 tablet by mouth daily.   No current facility-administered medications for this visit. 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41, led to Mitral valve prolapse   Past Surgical History:  Procedure Laterality Date   ABDOMINAL HYSTERECTOMY     APPENDECTOMY     BLADDER SUSPENSION     BREAST BIOPSY Left    BREAST EXCISIONAL BIOPSY Left    CATARACT EXTRACTION     CHOLECYSTECTOMY     DILATION AND CURETTAGE OF UTERUS     EYE SURGERY     HEAD & NECK SKIN LESION EXCISIONAL BIOPSY     top left scalp   LUMBAR LAMINECTOMY/DECOMPRESSION MICRODISCECTOMY N/A 08/21/2019   Procedure: Lumbar decompression L3-4 right, microdiscectomy L3-4 right;  Surgeon: Ranee Gosselin,  MD;  Location: [facility];  Service: Orthopedics;  Laterality: N/A;    SHOULDER ARTHROSCOPY WITH SUBACROMIAL DECOMPRESSION Right 01/30/2013   Procedure: SHOULDER ARTHROSCOPY WITH SUBACROMIAL DECOMPRESSION;  Surgeon: Drucilla Schmidt, MD;  Location: [facility];  Service: Orthopedics;  Laterality: Right;  with labral debridement   FAMILY HISTORY Family History  Problem Relation Age of Onset   Heart disease Father    Breast cancer Maternal Aunt    Diabetes Brother    Leukemia Sister    Colon cancer Neg Hx    Esophageal cancer Neg Hx    Rectal cancer Neg Hx    Stomach cancer Neg Hx    SOCIAL HISTORY Social History   Tobacco  Use   Smoking status: Never   Smokeless tobacco: Never  Vaping Use   Vaping status: Never Used  Substance Use Topics   Alcohol use: No   Drug use: No       OPHTHALMIC EXAM: Not recorded    IMAGING AND PROCEDURES  Imaging and Procedures for @TODAY @          ASSESSMENT/PLAN: No diagnosis found.  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, OS   - interval conversion to exudative ARMD noted on 7.26.22 - s/p IVA OS #1 (07.26.22), #2 (08.23.22), #3 (09.21.22), #4 (10.13.22), #5 (11.16.22), #6 (12.14.22), #7 (01.11.23), #8 (02.08.23), #9 (03.22.23), #10 (06.07.23), #11 (08.02.23), #12 (10.25.23), #13 (12.29.23)  - BCVA 20/250 OS (stable and at amblyopic baseline) - OCT shows OS: Persistent focal PED/CNV with stable improvement in overlying SRHM and retinal edema -- nasal macula -- at ~9 mos since last IVA OS - will hold off on OS again today - pt in agreement - IVA informed consent obtained and signed, 07.26.22 (OS) - f/u in 4 wks, DFE/OCT, possible injxn  2. 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 Mild non-proliferative diabetic retinopathy, both eyes - s/p IVA OD #1 (01.29.24), #2 (02.26.24), #3 (03.25.24), #4 (04.22.24), #5 (05.28.24) -- IVA resistance - s/p IVE OD #1 (07.09.24), #2 (08.12.24) - exam shows rare MA/IRH - FA 01.04.21 - late leaking, perifoveal MA OU; no NV OU - OCT OD shows persistent IRF/cystic changes IT macula and fovea -- slightly improved; Patchy ORA - stable at 4 wks - BCVA OD 20/25 from 20/30  - recommend IVE OD #3 today, 09.09.24 with follow up in 4 weeks  - Pt wishes to proceed w/ injection - RBA of procedure discussed,  questions answered - IVE informed consent obtained and signed 07.09.24 - see procedure note  - f/u 4 weeks DFE, OCT, likely injection OD  5. Severe amblyopia OS  - history of patching as a child -- no strabismus surgery - VA dropped down to 20/350 from 20/200 (baseline) due to conv to exu ARMD on 7.26.22  6. PVD / vitreous syneresis OS  - Discussed findings and prognosis  - No RT or RD on 360 scleral depressed exam  - Reviewed s/s of RT/RD  - strict return precautions for any such RT/RD symptoms  7. Pseudophakia OU  - s/p CE/IOL OU by [name]  - beautiful surgeries, doing well  - continue to monitor  8. PCO OU  -  pt reports some fogginess of vision - s/p YAG Cap OD (01.15.24) -- good opening -- BCVA improved to 20/30 from 20/40 - recommend yag cap OS at some point  9. Dry eyes OU  - persistent PEE on slit lamp exam.   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    No follow-ups on file.  There are no Patient Instructions on file for this visit.  This document serves as a record of services personally performed by Karie Chimera, MD, PhD. It was created on their behalf by Annalee Genta, COMT. The creation of this record is the provider's dictation and/or activities during the visit.  Electronically signed by: Annalee Genta, COMT 08/25/23 1:41 PM  Karie Chimera, M.D., Ph.D. Diseases & Surgery of the Retina and Vitreous [facility]    Abbreviations: M myopia (nearsighted); A astigmatism; H hyperopia (farsighted); P presbyopia; Mrx spectacle prescription;  CTL contact lenses; OD right eye; OS left eye; OU both eyes  XT exotropia; ET esotropia; PEK punctate epithelial keratitis; PEE punctate epithelial erosions; DES dry eye syndrome;  MGD meibomian gland dysfunction; ATs artificial tears; PFAT's preservative free artificial tears; NSC nuclear sclerotic cataract; PSC posterior subcapsular cataract; ERM epi-retinal membrane; PVD posterior vitreous detachment; RD retinal detachment; DM diabetes mellitus; DR diabetic retinopathy; NPDR non-proliferative diabetic retinopathy; PDR proliferative diabetic retinopathy; CSME clinically significant macular edema; DME diabetic macular edema; dbh dot blot hemorrhages; CWS cotton wool spot; POAG primary open angle glaucoma; C/D cup-to-disc ratio; HVF humphrey visual field; GVF goldmann visual field; OCT optical coherence tomography; IOP intraocular pressure; BRVO Branch retinal vein occlusion; CRVO central retinal vein occlusion; CRAO central retinal artery occlusion; BRAO branch retinal artery occlusion; RT retinal tear; SB scleral buckle; PPV pars plana vitrectomy; VH Vitreous hemorrhage; PRP panretinal laser photocoagulation; IVK intravitreal kenalog; VMT vitreomacular traction; MH Macular hole;  NVD neovascularization of the disc; NVE neovascularization elsewhere; AREDS age related eye disease study; ARMD age related macular degeneration; POAG primary open angle glaucoma; EBMD epithelial/anterior basement membrane dystrophy; ACIOL anterior chamber intraocular lens; IOL intraocular lens; PCIOL posterior chamber intraocular lens; Phaco/IOL phacoemulsification with intraocular lens placement; PRK photorefractive keratectomy; LASIK laser assisted in situ keratomileusis; HTN hypertension; DM diabetes mellitus; COPD chronic obstructive pulmonary disease

## 2023-08-28 ENCOUNTER — Encounter (INDEPENDENT_AMBULATORY_CARE_PROVIDER_SITE_OTHER): Payer: Self-pay | Admitting: Ophthalmology

## 2023-08-28 ENCOUNTER — Ambulatory Visit (INDEPENDENT_AMBULATORY_CARE_PROVIDER_SITE_OTHER): Payer: Medicare HMO | Admitting: Ophthalmology

## 2023-08-28 DIAGNOSIS — H26493 Other secondary cataract, bilateral: Secondary | ICD-10-CM

## 2023-08-28 DIAGNOSIS — H04123 Dry eye syndrome of bilateral lacrimal glands: Secondary | ICD-10-CM

## 2023-08-28 DIAGNOSIS — H353221 Exudative age-related macular degeneration, left eye, with active choroidal neovascularization: Secondary | ICD-10-CM | POA: Diagnosis not present

## 2023-08-28 DIAGNOSIS — E113213 Type 2 diabetes mellitus with mild nonproliferative diabetic retinopathy with macular edema, bilateral: Secondary | ICD-10-CM

## 2023-08-28 DIAGNOSIS — Z7984 Long term (current) use of oral hypoglycemic drugs: Secondary | ICD-10-CM | POA: Diagnosis not present

## 2023-08-28 DIAGNOSIS — H40053 Ocular hypertension, bilateral: Secondary | ICD-10-CM

## 2023-08-28 DIAGNOSIS — H353112 Nonexudative age-related macular degeneration, right eye, intermediate dry stage: Secondary | ICD-10-CM

## 2023-08-28 DIAGNOSIS — Z961 Presence of intraocular lens: Secondary | ICD-10-CM

## 2023-08-28 DIAGNOSIS — H43812 Vitreous degeneration, left eye: Secondary | ICD-10-CM | POA: Diagnosis not present

## 2023-08-28 DIAGNOSIS — H53002 Unspecified amblyopia, left eye: Secondary | ICD-10-CM

## 2023-08-28 MED ORDER — AFLIBERCEPT 2MG/0.05ML IZ SOLN FOR KALEIDOSCOPE
2.0000 mg | INTRAVITREAL | Status: AC | PRN
Start: 2023-08-28 — End: 2023-08-28
  Administered 2023-08-28: 2 mg via INTRAVITREAL

## 2023-08-28 NOTE — Progress Notes (Signed)
[facility] - Clinic Note  08/28/2023     CHIEF COMPLAINT Patient presents for Retina Follow Up   HISTORY OF PRESENT ILLNESS: Monica Hamilton is a 82 y.o. female who presents to the clinic today for:   HPI     Retina Follow Up   Patient presents with  Diabetic Retinopathy.  In both eyes.  This started 4 weeks ago.  Duration of 4 weeks.  Since onset it is stable.  I, the attending physician,  performed the HPI with the patient and updated documentation appropriately.        Comments   4 week retina follow up NPDR ou and IVE OD pt is reporting no vision changes noticed she denies any flashes or floaters she is using dorz/tim bid OU and systane bid OU pt is not checking her blood sugar and her last A1C was about a year ago       Last edited by Monica Chris, MD on 08/28/2023  6:41 PM.     Pt states she hasn't noticed any change in vision until she was trying to read the eye chart and the letters looked a lot smaller than normal, she does not check her blood sugar at home  Referring physician: Self-referral -- wife of Monica Hamilton  HISTORICAL INFORMATION:  Selected notes from the MEDICAL RECORD NUMBER Self referral Ocular Hx: pseudophakia OU Monica Hamilton 2019) PMH: DM (on metformin), HTN, arthritis,    CURRENT MEDICATIONS: Current Outpatient Medications (Ophthalmic Drugs)  Medication Sig   dorzolamide-timolol (COSOPT) 2-0.5 % ophthalmic solution Place 1 drop into both eyes 2 (two) times daily.   No current facility-administered medications for this visit. (Ophthalmic Drugs)   Current Outpatient Medications (Other)  Medication Sig   amLODipine (NORVASC) 5 MG tablet Take 5 mg by mouth daily.   aspirin 81 MG EC tablet Take 81 mg by mouth daily.    CINNAMON PO Take 1,000 mg by mouth daily.   gabapentin (NEURONTIN) 100 MG capsule gabapentin 100 mg capsule  TAKE 1 CAPSULE BY MOUTH ONCE DAILY AT BEDTIME FOR 30 DAYS   gabapentin (NEURONTIN) 300 MG capsule Take  600 mg by mouth at bedtime.   HYDROcodone-acetaminophen (NORCO) 7.5-325 MG tablet Take 1 tablet by mouth every 6 (six) hours as needed for severe pain ((score 7 to 10)).   metFORMIN (GLUCOPHAGE) 1000 MG tablet Take 1,000 mg by mouth 2 (two) times daily.    methocarbamol (ROBAXIN) 500 MG tablet Take 1 tablet (500 mg total) by mouth every 6 (six) hours as needed for muscle spasms.   metoprolol succinate (TOPROL-XL) 50 MG 24 hr tablet Take 50 mg by mouth daily. Take with or immediately following a meal.   MODERNA COVID-19 VACCINE 100 MCG/0.5ML injection    Multiple Vitamins-Minerals (ICAPS AREDS 2) CAPS Take 1 capsule by mouth 2 (two) times daily.    rosuvastatin (CRESTOR) 20 MG tablet Take by mouth.   valsartan-hydrochlorothiazide (DIOVAN-HCT) 160-25 MG tablet Take 1 tablet by mouth daily.   No current facility-administered medications for this visit. (Other)   REVIEW OF SYSTEMS: ROS   Positive for: Musculoskeletal, Endocrine, Cardiovascular, Eyes Negative for: Constitutional, Gastrointestinal, Neurological, Skin, Genitourinary, HENT, Respiratory, Psychiatric, Allergic/Imm, Heme/Lymph Last edited by Monica Hamilton, COT on 08/28/2023  1:01 PM.     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   Past Surgical History:  Procedure Laterality Date   ABDOMINAL HYSTERECTOMY     APPENDECTOMY     BLADDER SUSPENSION     BREAST BIOPSY Left    BREAST EXCISIONAL BIOPSY Left    CATARACT  EXTRACTION     CHOLECYSTECTOMY     DILATION AND CURETTAGE OF UTERUS     EYE SURGERY     HEAD & NECK SKIN LESION EXCISIONAL BIOPSY     top left scalp   LUMBAR LAMINECTOMY/DECOMPRESSION MICRODISCECTOMY N/A 08/21/2019   Procedure: Lumbar decompression L3-4 right, microdiscectomy L3-4 right;  Surgeon: Monica Gosselin, MD;  Location: [facility];  Service: Orthopedics;  Laterality: N/A;    SHOULDER ARTHROSCOPY WITH SUBACROMIAL DECOMPRESSION Right 01/30/2013   Procedure: SHOULDER ARTHROSCOPY WITH SUBACROMIAL DECOMPRESSION;  Surgeon: Monica Schmidt, MD;  Location: [facility];  Service: Orthopedics;  Laterality: Right;  with labral debridement   FAMILY HISTORY Family History  Problem Relation Age of Onset   Heart disease Father    Breast cancer Maternal Aunt    Diabetes Brother    Leukemia Sister    Colon cancer Neg Hx    Esophageal cancer Neg Hx    Rectal cancer Neg Hx    Stomach cancer Neg Hx    SOCIAL HISTORY Social History   Tobacco Use   Smoking status: Never   Smokeless tobacco: Never  Vaping Use   Vaping status: Never Used  Substance Use Topics   Alcohol use: No   Drug use: No       OPHTHALMIC EXAM: Base Eye Exam     Visual Acuity (Snellen - Linear)       Right Left   Dist [place] 20/40 -1 20/300   Dist ph  20/30 -3 NI         Tonometry (Tonopen, 1:08 PM)       Right Left   Pressure 14 17         Pupils       Pupils Dark Light Shape React APD   Right PERRL 3 2 Round Brisk None   Left PERRL 3 2 Round Brisk None         Visual Fields       Left Right    Full Full         Extraocular Movement       Right Left    Full, Ortho Full, Ortho         Neuro/Psych     Oriented x3: Yes   Mood/Affect: Normal         Dilation     Both eyes: 2.5% Phenylephrine @ 1:08 PM           Slit Lamp and Fundus Exam     Slit Lamp Exam       Right Left   Lids/Lashes Dermatochalasis - upper lid, mild Meibomian gland dysfunction Dermatochalasis -  upper lid, Meibomian gland dysfunction   Conjunctiva/Sclera nasal and temporal pinguecula Nasal Pinguecula   Cornea tear film debris, 3+ PEE, mild arcus,  well healed cataract wound, decreased TBUT 3+ Punctate epithelial erosions, Arcus, Temporal Well healed cataract wounds, trace Debris in tear film, decreased TBUT   Anterior Chamber Deep and quiet Deep and quiet   Iris Poorly dilated Round and dilated   Lens PC IOL in good position, Open posterior capsule PC IOL in good position, 1-2+Posterior capsular opacification   Anterior Vitreous Vitreous syneresis, Posterior vitreous detachment, vitreous condensations Vitreous syneresis, Posterior vitreous detachment         Fundus Exam       Right Left   Disc Pink and Sharp Pink and Sharp, mild temporal Peripapillary atrophy, mild PPP   C/D Ratio 0.5 0.4   Macula Flat, Blunted foveal reflex, RPE mottling and clumping, mild atrophy, Drusen, rare MA, focal IRH and edema / cystic changes temporal mac -- persistent Blunted foveal reflex, +CNV with pigment ring -- no surrounding edema -- stably improved, no heme, +drusen, RPE mottling   Vessels attenuated, Tortuous attenuated, Tortuous   Periphery Attached, scattered mild Reticular degeneration, no heme Attached, scattered Reticular degeneration, No heme.           IMAGING AND PROCEDURES  Imaging and Procedures for @TODAY @  OCT, Retina - OU - Both Eyes       Right Eye Quality was good. Central Foveal Thickness: 288. Progression has improved. Findings include normal foveal contour, no SRF, myopic contour, retinal drusen , intraretinal fluid, outer retinal atrophy (persistent IRF/cystic changes IT macula and fovea -- slightly improved, Patchy ORA -- stable).   Left Eye Quality was good. Central Foveal Thickness: 260. Progression has been stable. Findings include no IRF, no SRF, abnormal foveal contour, retinal drusen , subretinal hyper-reflective material, intraretinal hyper-reflective material,  pigment epithelial detachment, outer retinal atrophy (Persistent focal PED/CNV with stable improvement in overlying SRHM and retinal edema).   Notes *Images captured and stored on drive  Diagnosis / Impression: Mild DME OU OD: persistent IRF/cystic changes IT macula and fovea -- slightly improved, Patchy ORA -- stable OS: Exudative ARMD - Persistent focal PED/CNV with stable improvement in overlying SRHM and retinal edema -- nasal macula  Clinical management:  See below  Abbreviations: NFP - Normal foveal profile. CME - cystoid macular edema. PED - pigment epithelial detachment. IRF - intraretinal fluid. SRF - subretinal fluid. EZ - ellipsoid zone. ERM - epiretinal membrane. ORA - outer retinal atrophy. ORT - outer retinal tubulation. SRHM - subretinal hyper-reflective material       Intravitreal Injection, Pharmacologic Agent - OD - Right Eye       Time Out 08/28/2023. 1:44 PM. Confirmed correct patient, procedure, site, and patient consented.   Anesthesia Topical anesthesia was used. Anesthetic medications included Lidocaine 2%, Proparacaine 0.5%.   Procedure Preparation included 5% betadine to ocular surface, eyelid speculum. A (32g) needle was used.   Injection: 2 mg aflibercept 2 MG/0.05ML   Route: Intravitreal, Site: Right Eye   NDC: [id number], Lot: [id number], Expiration date: 09/20/2024, Waste: 0 mL   Post-op Post injection exam found visual acuity of at least counting fingers. The patient tolerated the procedure well. There were no complications. The patient received written and verbal post procedure care education. Post injection medications were not given.            ASSESSMENT/PLAN:   ICD-10-CM   1. Exudative age-related macular degeneration of left eye with active choroidal neovascularization (HCC)  H35.3221 OCT, Retina - OU - Both Eyes    2. Intermediate stage nonexudative age-related macular degeneration of right eye  H35.3112     3. Both eyes  affected by mild nonproliferative diabetic retinopathy with macular edema, associated with type 2 diabetes mellitus (HCC)  I69.6295 Intravitreal Injection, Pharmacologic Agent - OD - Right Eye    aflibercept (EYLEA) SOLN 2 mg    4. Long term (current) use of oral hypoglycemic drugs  Z79.84     5. Amblyopia of left eye  H53.002     6. Posterior vitreous detachment of left eye  H43.812     7. Pseudophakia of both eyes  Z96.1     8. Bilateral posterior capsular opacification  H26.493     9. 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, OS   - interval conversion to exudative ARMD noted on 7.26.22 - s/p IVA OS #1 (07.26.22), #2 (08.23.22), #3 (09.21.22), #4 (10.13.22), #5 (11.16.22), #6 (12.14.22), #7 (01.11.23), #8 (02.08.23), #9 (03.22.23), #10 (06.07.23), #11 (08.02.23), #12 (10.25.23), #13 (12.29.23)  - BCVA 20/300 OS (stable and at amblyopic baseline) - OCT shows OS: Persistent focal PED/CNV with stable improvement in overlying SRHM and retinal edema -- nasal macula -- at ~10 mos since last IVA OS - will hold off on OS again today - pt in agreement - IVA informed consent obtained and signed, 07.26.22 (OS) - f/u in 4 wks, DFE/OCT, possible injxn  2. 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 Mild non-proliferative diabetic retinopathy, both eyes - s/p IVA OD #1 (01.29.24), #2 (02.26.24), #3 (03.25.24), #4 (04.22.24), #5 (05.28.24) -- IVA resistance - s/p IVE OD #1 (07.09.24), #2 (08.12.24), #3 (09.09.24) - exam shows rare MA/IRH - FA 01.04.21 - late leaking, perifoveal MA OU; no NV OU - OCT OD  shows persistent IRF/cystic changes IT macula and fovea -- slightly improved, Patchy ORA -- stable at 4 wks - BCVA OD 20/30 -- stable  - recommend IVE OD #4 today, 10.07.24 with follow up in 4 weeks  - Pt wishes to proceed w/ injection - RBA of procedure discussed, questions answered - IVE informed consent obtained and signed 07.09.24 - see procedure note  - f/u 4 weeks DFE, OCT, likely injection OD  5. Severe amblyopia OS  - history of patching as a child -- no strabismus surgery - VA dropped down to 20/300 from 20/250 (baseline) due to conv to exu ARMD on 7.26.22  6. PVD / vitreous syneresis OS  - Discussed findings and prognosis  - No RT or RD on 360 scleral depressed exam  - Reviewed s/s of RT/RD  - strict return precautions for any such RT/RD symptoms  7. Pseudophakia OU  - s/p CE/IOL OU by Monica Hamilton  - beautiful surgeries, doing well  - continue to monitor  8. PCO OU  - pt reports some fogginess of vision - s/p YAG Cap OD (01.15.24) -- good opening -- BCVA improved to 20/30 from 20/40 - recommend yag cap OS at some point  9. Dry eyes OU  - persistent PEE on slit lamp exam.   - continue artificial tears qid and lubricating ointment as needed--patient not using  10. Ocular Hypertension  OU  - IOP today 14,17  - continue cosopt bid OU  Ophthalmic Meds Ordered this visit:  Meds ordered this encounter  Medications   aflibercept (EYLEA) SOLN 2 mg     Return in about 4 weeks (around 09/25/2023) for f/u +DME and exu ARMD OU, DFE, OCT, Possible Injxn.  There are no Patient Instructions on file for this visit.  This document serves as a record of services personally performed by Karie Chimera, MD, PhD. It was created on their behalf by Annalee Genta, COMT. The creation of this record is the provider's dictation and/or activities during the visit.  Electronically signed by: Annalee Genta, COMT 08/28/23 9:55 PM  This document serves as a record of services personally  performed by Karie Chimera, MD, PhD. It was created on their behalf by Glee Arvin. Manson Passey, OA an ophthalmic technician. The creation of this record is the provider's dictation and/or activities during the visit.    Electronically signed by: Glee Arvin. Manson Passey, OA 08/28/23 9:55 PM   Karie Chimera, M.D., Ph.D. Diseases & Surgery of the Retina and Vitreous [facility]  I have reviewed the above documentation for accuracy and completeness, and I agree with the above. Karie Chimera, M.D., Ph.D. 08/28/23 9:57 PM  Abbreviations: M myopia (nearsighted); A astigmatism; H hyperopia (farsighted); P presbyopia; Mrx spectacle prescription;  CTL contact lenses; OD right eye; OS left eye; OU both eyes  XT exotropia; ET esotropia; PEK punctate epithelial keratitis; PEE punctate epithelial erosions; DES dry eye syndrome; MGD meibomian gland dysfunction; ATs artificial tears; PFAT's preservative free artificial tears; NSC nuclear sclerotic cataract; PSC posterior subcapsular cataract; ERM epi-retinal membrane; PVD posterior vitreous detachment; RD retinal detachment; DM diabetes mellitus; DR diabetic retinopathy; NPDR non-proliferative diabetic retinopathy; PDR proliferative diabetic retinopathy; CSME clinically significant macular edema; DME diabetic macular edema; dbh dot blot hemorrhages; CWS cotton wool spot; POAG primary open angle glaucoma; C/D cup-to-disc ratio; HVF humphrey visual field; GVF goldmann visual field; OCT optical coherence tomography; IOP intraocular pressure; BRVO Branch retinal vein occlusion; CRVO central retinal vein occlusion; CRAO central retinal artery occlusion; BRAO branch retinal artery occlusion; RT retinal tear; SB scleral buckle; PPV pars plana vitrectomy; VH Vitreous hemorrhage; PRP panretinal laser photocoagulation; IVK intravitreal kenalog; VMT vitreomacular traction; MH Macular hole;  NVD neovascularization of the disc; NVE neovascularization elsewhere; AREDS  age related eye disease study; ARMD age related macular degeneration; POAG primary open angle glaucoma; EBMD epithelial/anterior basement membrane dystrophy; ACIOL anterior chamber intraocular lens; IOL intraocular lens; PCIOL posterior chamber intraocular lens; Phaco/IOL phacoemulsification with intraocular lens placement; PRK photorefractive keratectomy; LASIK laser assisted in situ keratomileusis; HTN hypertension; DM diabetes mellitus; COPD chronic obstructive pulmonary disease

## 2023-08-30 DIAGNOSIS — E1129 Type 2 diabetes mellitus with other diabetic kidney complication: Secondary | ICD-10-CM | POA: Diagnosis not present

## 2023-08-30 DIAGNOSIS — I10 Essential (primary) hypertension: Secondary | ICD-10-CM | POA: Diagnosis not present

## 2023-09-04 DIAGNOSIS — Z809 Family history of malignant neoplasm, unspecified: Secondary | ICD-10-CM | POA: Diagnosis not present

## 2023-09-04 DIAGNOSIS — H353290 Exudative age-related macular degeneration, unspecified eye, stage unspecified: Secondary | ICD-10-CM | POA: Diagnosis not present

## 2023-09-04 DIAGNOSIS — I129 Hypertensive chronic kidney disease with stage 1 through stage 4 chronic kidney disease, or unspecified chronic kidney disease: Secondary | ICD-10-CM | POA: Diagnosis not present

## 2023-09-04 DIAGNOSIS — Z008 Encounter for other general examination: Secondary | ICD-10-CM | POA: Diagnosis not present

## 2023-09-04 DIAGNOSIS — I2510 Atherosclerotic heart disease of native coronary artery without angina pectoris: Secondary | ICD-10-CM | POA: Diagnosis not present

## 2023-09-04 DIAGNOSIS — Z7982 Long term (current) use of aspirin: Secondary | ICD-10-CM | POA: Diagnosis not present

## 2023-09-04 DIAGNOSIS — M40209 Unspecified kyphosis, site unspecified: Secondary | ICD-10-CM | POA: Diagnosis not present

## 2023-09-04 DIAGNOSIS — M1990 Unspecified osteoarthritis, unspecified site: Secondary | ICD-10-CM | POA: Diagnosis not present

## 2023-09-04 DIAGNOSIS — E1122 Type 2 diabetes mellitus with diabetic chronic kidney disease: Secondary | ICD-10-CM | POA: Diagnosis not present

## 2023-09-04 DIAGNOSIS — E785 Hyperlipidemia, unspecified: Secondary | ICD-10-CM | POA: Diagnosis not present

## 2023-09-04 DIAGNOSIS — Z8249 Family history of ischemic heart disease and other diseases of the circulatory system: Secondary | ICD-10-CM | POA: Diagnosis not present

## 2023-09-04 DIAGNOSIS — N182 Chronic kidney disease, stage 2 (mild): Secondary | ICD-10-CM | POA: Diagnosis not present

## 2023-09-04 DIAGNOSIS — Z7984 Long term (current) use of oral hypoglycemic drugs: Secondary | ICD-10-CM | POA: Diagnosis not present

## 2023-09-22 NOTE — Progress Notes (Incomplete)
[facility] - Clinic Note  09/25/2023     CHIEF COMPLAINT Patient presents for No chief complaint on file.   HISTORY OF PRESENT ILLNESS: Monica Hamilton is a 82 y.o. female who presents to the clinic today for:     Pt states she hasn't noticed any change in vision until she was trying to read the eye chart and the letters looked a lot smaller than normal, she does not check her blood sugar at home  Referring physician: Self-referral -- wife of Sukari Grist  HISTORICAL INFORMATION:  Selected notes from the MEDICAL RECORD NUMBER Self referral Ocular Hx: pseudophakia OU [name] 2019) PMH: DM (on metformin), HTN, arthritis,    CURRENT MEDICATIONS: Current Outpatient Medications (Ophthalmic Drugs)  Medication Sig   dorzolamide-timolol (COSOPT) 2-0.5 % ophthalmic solution Place 1 drop into both eyes 2 (two) times daily.   No current facility-administered medications for this visit. (Ophthalmic Drugs)   Current Outpatient Medications (Other)  Medication Sig   amLODipine (NORVASC) 5 MG tablet Take 5 mg by mouth daily.   aspirin 81 MG EC tablet Take 81 mg by mouth daily.    CINNAMON PO Take 1,000 mg by mouth daily.   gabapentin (NEURONTIN) 100 MG capsule gabapentin 100 mg capsule  TAKE 1 CAPSULE BY MOUTH ONCE DAILY AT BEDTIME FOR 30 DAYS   gabapentin (NEURONTIN) 300 MG capsule Take 600 mg by mouth at bedtime.   HYDROcodone-acetaminophen (NORCO) 7.5-325 MG tablet Take 1 tablet by mouth every 6 (six) hours as needed for severe pain ((score 7 to 10)).   metFORMIN (GLUCOPHAGE) 1000 MG tablet Take 1,000 mg by mouth 2 (two) times daily.    methocarbamol (ROBAXIN) 500 MG tablet Take 1 tablet (500 mg total) by mouth every 6 (six) hours as needed for muscle spasms.   metoprolol succinate (TOPROL-XL) 50 MG 24 hr tablet Take 50 mg by mouth daily. Take with or immediately following a meal.   MODERNA COVID-19 VACCINE 100 MCG/0.5ML injection    Multiple Vitamins-Minerals  (ICAPS AREDS 2) CAPS Take 1 capsule by mouth 2 (two) times daily.    rosuvastatin (CRESTOR) 20 MG tablet Take by mouth.   valsartan-hydrochlorothiazide (DIOVAN-HCT) 160-25 MG tablet Take 1 tablet by mouth daily.   No current facility-administered medications for this visit. 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69, led to Mitral valve prolapse   Past Surgical History:  Procedure Laterality Date   ABDOMINAL HYSTERECTOMY     APPENDECTOMY     BLADDER SUSPENSION     BREAST BIOPSY Left    BREAST EXCISIONAL BIOPSY Left    CATARACT EXTRACTION     CHOLECYSTECTOMY     DILATION AND CURETTAGE OF UTERUS     EYE SURGERY     HEAD & NECK SKIN LESION EXCISIONAL BIOPSY     top left scalp  LUMBAR LAMINECTOMY/DECOMPRESSION MICRODISCECTOMY N/A 08/21/2019   Procedure: Lumbar decompression L3-4 right, microdiscectomy L3-4 right;  Surgeon: Ranee Gosselin, MD;  Location: [facility];  Service: Orthopedics;  Laterality: N/A;    SHOULDER ARTHROSCOPY WITH SUBACROMIAL DECOMPRESSION Right 01/30/2013   Procedure: SHOULDER ARTHROSCOPY WITH SUBACROMIAL DECOMPRESSION;  Surgeon: Drucilla Schmidt, MD;  Location: [facility];  Service: Orthopedics;  Laterality: Right;  with labral debridement   FAMILY HISTORY Family History  Problem Relation Age of Onset   Heart disease Father    Breast cancer Maternal Aunt    Diabetes Brother    Leukemia Sister    Colon cancer Neg Hx    Esophageal cancer Neg Hx     Rectal cancer Neg Hx    Stomach cancer Neg Hx    SOCIAL HISTORY Social History   Tobacco Use   Smoking status: Never   Smokeless tobacco: Never  Vaping Use   Vaping status: Never Used  Substance Use Topics   Alcohol use: No   Drug use: No       OPHTHALMIC EXAM: Not recorded    IMAGING AND PROCEDURES  Imaging and Procedures for @TODAY @          ASSESSMENT/PLAN: No diagnosis found.  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, OS   - interval conversion to exudative ARMD noted on 7.26.22 - s/p IVA OS #1 (07.26.22), #2 (08.23.22), #3 (09.21.22), #4 (10.13.22), #5 (11.16.22), #6 (12.14.22), #7 (01.11.23), #8 (02.08.23), #9 (03.22.23), #10 (06.07.23), #11 (08.02.23), #12 (10.25.23), #13 (12.29.23)  - BCVA 20/300 OS (stable and at amblyopic baseline) - OCT shows OS: Persistent focal PED/CNV with stable improvement in overlying SRHM and retinal edema -- nasal macula -- at ~10 mos since last IVA OS - will hold off on OS again today - pt in agreement - IVA informed consent obtained and signed, 07.26.22 (OS) - f/u in 4 wks, DFE/OCT, possible injxn  2. 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 Mild non-proliferative diabetic retinopathy, both eyes - s/p IVA OD #1 (01.29.24), #2 (02.26.24), #3 (03.25.24), #4 (04.22.24), #5 (05.28.24) -- IVA resistance - s/p IVE OD #1 (07.09.24), #2 (08.12.24), #3 (09.09.24), #4 (10.07.24) - exam shows rare MA/IRH - FA 01.04.21 - late leaking, perifoveal MA OU; no NV OU - OCT OD shows persistent IRF/cystic changes IT macula and fovea -- slightly improved, Patchy ORA -- stable at 4 wks - BCVA OD 20/30 -- stable  - recommend IVE  OD #5 today, 11.04.24 with follow up in 4 weeks  - Pt wishes to proceed w/ injection - RBA of procedure discussed, questions answered - IVE informed consent obtained and signed 07.09.24 - see procedure note  - f/u 4 weeks DFE, OCT, likely injection OD  5. Severe amblyopia OS  - history of patching as a child -- no strabismus surgery - VA dropped down to 20/300 from 20/250 (baseline) due to conv to exu ARMD on 7.26.22  6. PVD / vitreous syneresis OS  - Discussed findings and prognosis  - No RT or RD on 360 scleral depressed exam  - Reviewed s/s of RT/RD  - strict return precautions for any such RT/RD symptoms  7. Pseudophakia  OU  - s/p CE/IOL OU by [name]  - beautiful surgeries, doing well  - continue to monitor  8. PCO OU  - pt reports some fogginess of vision - s/p YAG Cap OD (01.15.24) -- good opening -- BCVA improved to 20/30 from 20/40 - recommend yag cap OS at some point  9. Dry eyes OU  - persistent PEE on slit lamp exam.   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    No follow-ups on file.  There are no Patient Instructions on file for this visit.  This document serves as a record of services personally performed by Karie Chimera, MD, PhD. It was created on their behalf by Annalee Genta, COMT. The creation of this record is the provider's dictation and/or activities during the visit.  Electronically signed by: Annalee Genta, COMT 09/22/23 10:00 AM     Karie Chimera, M.D., Ph.D. Diseases & Surgery of the Retina and Vitreous [facility]   Abbreviations: M myopia (nearsighted); A astigmatism; H hyperopia (farsighted); P presbyopia; Mrx spectacle prescription;  CTL contact lenses; OD right eye; OS left eye; OU both eyes   XT exotropia; ET esotropia; PEK punctate epithelial keratitis; PEE punctate epithelial erosions; DES dry eye syndrome; MGD meibomian gland dysfunction; ATs artificial tears; PFAT's preservative free artificial tears; NSC nuclear sclerotic cataract; PSC posterior subcapsular cataract; ERM epi-retinal membrane; PVD posterior vitreous detachment; RD retinal detachment; DM diabetes mellitus; DR diabetic retinopathy; NPDR non-proliferative diabetic retinopathy; PDR proliferative diabetic retinopathy; CSME clinically significant macular edema; DME diabetic macular edema; dbh dot blot hemorrhages; CWS cotton wool spot; POAG primary open angle glaucoma; C/D cup-to-disc ratio; HVF humphrey visual field; GVF goldmann visual field; OCT optical coherence tomography; IOP intraocular pressure; BRVO Branch retinal vein occlusion; CRVO central retinal vein occlusion; CRAO central retinal artery occlusion; BRAO branch retinal artery occlusion; RT retinal tear; SB scleral buckle; PPV pars plana vitrectomy; VH Vitreous hemorrhage; PRP panretinal laser photocoagulation; IVK intravitreal kenalog; VMT vitreomacular traction; MH Macular hole;  NVD neovascularization of the disc; NVE neovascularization elsewhere; AREDS age related eye disease study; ARMD age related macular degeneration; POAG primary open angle glaucoma; EBMD epithelial/anterior basement membrane dystrophy; ACIOL anterior chamber intraocular lens; IOL intraocular lens; PCIOL posterior chamber intraocular lens; Phaco/IOL phacoemulsification with intraocular lens placement; PRK photorefractive keratectomy; LASIK laser assisted in situ keratomileusis; HTN hypertension; DM diabetes mellitus; COPD chronic obstructive pulmonary disease

## 2023-09-25 ENCOUNTER — Ambulatory Visit (INDEPENDENT_AMBULATORY_CARE_PROVIDER_SITE_OTHER): Payer: Medicare HMO | Admitting: Ophthalmology

## 2023-09-25 ENCOUNTER — Encounter (INDEPENDENT_AMBULATORY_CARE_PROVIDER_SITE_OTHER): Payer: Self-pay | Admitting: Ophthalmology

## 2023-09-25 DIAGNOSIS — H353112 Nonexudative age-related macular degeneration, right eye, intermediate dry stage: Secondary | ICD-10-CM | POA: Diagnosis not present

## 2023-09-25 DIAGNOSIS — H353221 Exudative age-related macular degeneration, left eye, with active choroidal neovascularization: Secondary | ICD-10-CM

## 2023-09-25 DIAGNOSIS — H40053 Ocular hypertension, bilateral: Secondary | ICD-10-CM | POA: Diagnosis not present

## 2023-09-25 DIAGNOSIS — Z961 Presence of intraocular lens: Secondary | ICD-10-CM

## 2023-09-25 DIAGNOSIS — H43812 Vitreous degeneration, left eye: Secondary | ICD-10-CM | POA: Diagnosis not present

## 2023-09-25 DIAGNOSIS — E113213 Type 2 diabetes mellitus with mild nonproliferative diabetic retinopathy with macular edema, bilateral: Secondary | ICD-10-CM

## 2023-09-25 DIAGNOSIS — H26493 Other secondary cataract, bilateral: Secondary | ICD-10-CM | POA: Diagnosis not present

## 2023-09-25 DIAGNOSIS — H04123 Dry eye syndrome of bilateral lacrimal glands: Secondary | ICD-10-CM | POA: Diagnosis not present

## 2023-09-25 DIAGNOSIS — H53002 Unspecified amblyopia, left eye: Secondary | ICD-10-CM | POA: Diagnosis not present

## 2023-09-25 DIAGNOSIS — Z7984 Long term (current) use of oral hypoglycemic drugs: Secondary | ICD-10-CM

## 2023-09-25 MED ORDER — AFLIBERCEPT 2MG/0.05ML IZ SOLN FOR KALEIDOSCOPE
2.0000 mg | INTRAVITREAL | Status: AC | PRN
Start: 2023-09-25 — End: 2023-09-25
  Administered 2023-09-25: 2 mg via INTRAVITREAL

## 2023-09-25 NOTE — Progress Notes (Signed)
[facility] - Clinic Note  09/25/2023     CHIEF COMPLAINT Patient presents for Retina Follow Up   HISTORY OF PRESENT ILLNESS: Monica Hamilton is a 82 y.o. female who presents to the clinic today for:   HPI     Retina Follow Up   Patient presents with  Wet AMD.  In left eye.  Severity is moderate.  Duration of 4 weeks.  Since onset it is stable.  I, the attending physician,  performed the HPI with the patient and updated documentation appropriately.        Comments   Pt here for 4 wk ret f/u exu ARMD OS. Pt states VA the same, no changes. Pt using cosopt BID OU and ATS BID OU. Pt had A1C checked and it was excellent, believes the # was 2.       Last edited by Rennis Chris, MD on 09/25/2023  6:07 PM.      Pt states vision is stable  Referring physician: Self-referral -- wife of Brittnae Aschenbrenner  HISTORICAL INFORMATION:  Selected notes from the MEDICAL RECORD NUMBER Self referral Ocular Hx: pseudophakia OU [name] 2019) PMH: DM (on metformin), HTN, arthritis,    CURRENT MEDICATIONS: Current Outpatient Medications (Ophthalmic Drugs)  Medication Sig   dorzolamide-timolol (COSOPT) 2-0.5 % ophthalmic solution Place 1 drop into both eyes 2 (two) times daily.   No current facility-administered medications for this visit. (Ophthalmic Drugs)   Current Outpatient Medications (Other)  Medication Sig   amLODipine (NORVASC) 5 MG tablet Take 5 mg by mouth daily.   aspirin 81 MG EC tablet Take 81 mg by mouth daily.    CINNAMON PO Take 1,000 mg by mouth daily.   gabapentin (NEURONTIN) 100 MG capsule gabapentin 100 mg capsule  TAKE 1 CAPSULE BY MOUTH ONCE DAILY AT BEDTIME FOR 30 DAYS   gabapentin (NEURONTIN) 300 MG capsule Take 600 mg by mouth at bedtime.   HYDROcodone-acetaminophen (NORCO) 7.5-325 MG tablet Take 1 tablet by mouth every 6 (six) hours as needed for severe pain ((score 7 to 10)).   metFORMIN (GLUCOPHAGE) 1000 MG tablet Take 1,000 mg by mouth 2 (two)  times daily.    methocarbamol (ROBAXIN) 500 MG tablet Take 1 tablet (500 mg total) by mouth every 6 (six) hours as needed for muscle spasms.   metoprolol succinate (TOPROL-XL) 50 MG 24 hr tablet Take 50 mg by mouth daily. Take with or immediately following a meal.   MODERNA COVID-19 VACCINE 100 MCG/0.5ML injection    Multiple Vitamins-Minerals (ICAPS AREDS 2) CAPS Take 1 capsule by mouth 2 (two) times daily.    rosuvastatin (CRESTOR) 20 MG tablet Take by mouth.   valsartan-hydrochlorothiazide (DIOVAN-HCT) 160-25 MG tablet Take 1 tablet by mouth daily.   No current facility-administered medications for this visit. (Other)   REVIEW OF SYSTEMS: ROS   Positive for: Musculoskeletal, Endocrine, Cardiovascular, Eyes Negative for: Constitutional, Gastrointestinal, Neurological, Skin, Genitourinary, HENT, Respiratory, Psychiatric, Allergic/Imm, Heme/Lymph Last edited by Thompson Grayer, COT on 09/25/2023  1:09 PM.      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    Age 57, led to Mitral valve prolapse   Past Surgical History:  Procedure Laterality Date   ABDOMINAL HYSTERECTOMY     APPENDECTOMY     BLADDER SUSPENSION     BREAST BIOPSY Left    BREAST EXCISIONAL BIOPSY Left    CATARACT EXTRACTION     CHOLECYSTECTOMY     DILATION AND CURETTAGE OF UTERUS     EYE SURGERY     HEAD & NECK SKIN LESION EXCISIONAL BIOPSY     top left scalp   LUMBAR LAMINECTOMY/DECOMPRESSION MICRODISCECTOMY N/A 08/21/2019   Procedure: Lumbar  decompression L3-4 right, microdiscectomy L3-4 right;  Surgeon: Ranee Gosselin, MD;  Location: [facility];  Service: Orthopedics;  Laterality: N/A;    SHOULDER ARTHROSCOPY WITH SUBACROMIAL DECOMPRESSION Right 01/30/2013   Procedure: SHOULDER ARTHROSCOPY WITH SUBACROMIAL DECOMPRESSION;  Surgeon: Drucilla Schmidt, MD;  Location: [facility];  Service: Orthopedics;  Laterality: Right;  with labral debridement   FAMILY HISTORY Family History  Problem Relation Age of Onset   Heart disease Father    Breast cancer Maternal Aunt    Diabetes Brother    Leukemia Sister    Colon cancer Neg Hx    Esophageal cancer Neg Hx    Rectal cancer Neg Hx    Stomach cancer Neg Hx    SOCIAL HISTORY Social History   Tobacco Use   Smoking status: Never   Smokeless tobacco: Never  Vaping Use   Vaping status: Never Used  Substance Use Topics   Alcohol use: No   Drug use: No       OPHTHALMIC EXAM: Base Eye Exam     Visual Acuity (Snellen - Linear)       Right Left   Dist [place] 20/30 -2 20/250 -1   Dist ph [place] NI NI         Tonometry (Tonopen, 1:16 PM)       Right Left   Pressure 19 19         Pupils       Pupils Dark Light Shape React APD   Right PERRL 3 2 Round Brisk None   Left PERRL 3 2 Round Brisk None         Visual Fields (Counting fingers)       Left Right    Full Full         Extraocular Movement       Right Left    Full, Ortho Full, Ortho         Neuro/Psych     Oriented x3: Yes   Mood/Affect: Normal         Dilation     Both eyes: 1.0% Mydriacyl, 2.5% Phenylephrine @ 1:17 PM           Slit Lamp and Fundus Exam     Slit Lamp Exam       Right Left   Lids/Lashes Dermatochalasis - upper lid, mild Meibomian gland dysfunction Dermatochalasis - upper lid, Meibomian gland dysfunction   Conjunctiva/Sclera nasal and temporal pinguecula Nasal Pinguecula   Cornea tear film debris, 3+ PEE, mild arcus, well healed cataract wound, decreased TBUT 3+ Punctate  epithelial erosions, Arcus, Temporal Well healed cataract wounds, trace Debris in tear film, decreased TBUT   Anterior Chamber Deep and quiet Deep and quiet  Iris Poorly dilated Round and dilated   Lens PC IOL in good position, Open posterior capsule PC IOL in good position, 1-2+Posterior capsular opacification   Anterior Vitreous Vitreous syneresis, Posterior vitreous detachment, vitreous condensations Vitreous syneresis, Posterior vitreous detachment         Fundus Exam       Right Left   Disc Pink and Sharp Pink and Sharp, mild temporal Peripapillary atrophy, mild PPP   C/D Ratio 0.5 0.4   Macula Flat, Blunted foveal reflex, RPE mottling and clumping, mild atrophy, Drusen, rare MA, focal IRH and edema / cystic changes temporal mac -- slightly improved Blunted foveal reflex, +CNV with pigment ring -- no surrounding edema -- stably improved, no heme, +drusen, RPE mottling   Vessels attenuated, Tortuous attenuated, Tortuous   Periphery Attached, scattered mild Reticular degeneration, no heme Attached, scattered Reticular degeneration, No heme.           IMAGING AND PROCEDURES  Imaging and Procedures for @TODAY @  OCT, Retina - OU - Both Eyes       Right Eye Quality was good. Central Foveal Thickness: 285. Progression has improved. Findings include normal foveal contour, no SRF, myopic contour, retinal drusen , intraretinal fluid, outer retinal atrophy (persistent IRF/cystic changes IT macula and fovea -- slightly improved, Patchy ORA -- stable).   Left Eye Quality was good. Central Foveal Thickness: 266. Progression has been stable. Findings include no IRF, no SRF, abnormal foveal contour, retinal drusen , subretinal hyper-reflective material, intraretinal hyper-reflective material, pigment epithelial detachment, outer retinal atrophy (Persistent focal PED/CNV with stable improvement in overlying SRHM and retinal edema).   Notes *Images captured and stored on drive  Diagnosis /  Impression: Mild DME OU OD: persistent IRF/cystic changes IT macula and fovea -- slightly improved, Patchy ORA -- stable OS: Exudative ARMD - Persistent focal PED/CNV with stable improvement in overlying SRHM and retinal edema -- nasal macula  Clinical management:  See below  Abbreviations: NFP - Normal foveal profile. CME - cystoid macular edema. PED - pigment epithelial detachment. IRF - intraretinal fluid. SRF - subretinal fluid. EZ - ellipsoid zone. ERM - epiretinal membrane. ORA - outer retinal atrophy. ORT - outer retinal tubulation. SRHM - subretinal hyper-reflective material       Intravitreal Injection, Pharmacologic Agent - OD - Right Eye       Time Out 09/25/2023. 1:34 PM. Confirmed correct patient, procedure, site, and patient consented.   Anesthesia Topical anesthesia was used. Anesthetic medications included Lidocaine 2%, Proparacaine 0.5%.   Procedure Preparation included 5% betadine to ocular surface, eyelid speculum. A (32g) needle was used.   Injection: 2 mg aflibercept 2 MG/0.05ML   Route: Intravitreal, Site: Right Eye   NDC: [id number], Lot: [id number], Expiration date: 07/21/2024, Waste: 0 mL   Post-op Post injection exam found visual acuity of at least counting fingers. The patient tolerated the procedure well. There were no complications. The patient received written and verbal post procedure care education. Post injection medications were not given.            ASSESSMENT/PLAN:   ICD-10-CM   1. Exudative age-related macular degeneration of left eye with active choroidal neovascularization (HCC)  H35.3221 OCT, Retina - OU - Both Eyes    2. Intermediate stage nonexudative age-related macular degeneration of right eye  H35.3112     3. Both eyes affected by mild nonproliferative diabetic retinopathy with macular edema, associated with type 2 diabetes mellitus (HCC)  E11.3213 OCT, Retina - OU - Both Eyes  Intravitreal Injection, Pharmacologic Agent -  OD - Right Eye    aflibercept (EYLEA) SOLN 2 mg    4. Long term (current) use of oral hypoglycemic drugs  Z79.84     5. Amblyopia of left eye  H53.002     6. Posterior vitreous detachment of left eye  H43.812     7. Pseudophakia of both eyes  Z96.1     8. Bilateral posterior capsular opacification  H26.493     9. Dry eyes  H04.123     10. 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, OS   - interval conversion to exudative ARMD noted on 7.26.22 - s/p IVA OS #1 (07.26.22), #2 (08.23.22), #3 (09.21.22), #4 (10.13.22), #5 (11.16.22), #6 (12.14.22), #7 (01.11.23), #8 (02.08.23), #9 (03.22.23), #10 (06.07.23), #11 (08.02.23), #12 (10.25.23), #13 (12.29.23)  - BCVA 20/250 OS (stable and at amblyopic baseline) - OCT shows OS: Persistent focal PED/CNV with stable improvement in overlying SRHM and retinal edema -- nasal macula -- at ~11 mos since last IVA OS - will hold off on OS again today - pt in agreement - IVA informed consent obtained and signed, 07.26.22 (OS) - f/u in 4 wks, DFE/OCT, possible injxn  2. 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 Mild non-proliferative diabetic retinopathy, both eyes  - A1c: 6.2 on 10.09.24 - s/p IVA OD #1 (01.29.24), #2 (02.26.24), #3 (03.25.24), #4 (04.22.24), #5 (05.28.24) -- IVA resistance - s/p IVE OD #1 (07.09.24), #2 (08.12.24), #3 (09.09.24), #4 (10.07.24) - exam shows rare MA/IRH - FA 01.04.21 - late leaking, perifoveal MA OU; no NV OU - OCT OD shows persistent IRF/cystic changes IT macula and fovea -- slightly improved, Patchy ORA -- stable at 4 wks - BCVA OD 20/30 -- stable  -  recommend IVE OD #5 today, 11.04.24 with follow up extended to 4-5 weeks  - Pt wishes to proceed w/ injection - RBA of procedure discussed, questions answered - IVE informed consent obtained and signed 07.09.24 - see procedure note  - f/u 4-5 weeks DFE, OCT, likely injection OD  5. Severe amblyopia OS  - history of patching as a child -- no strabismus surgery - VA dropped down to 20/300 from 20/250 (baseline) due to conv to exu ARMD on 7.26.22  6. PVD / vitreous syneresis OS  - Discussed findings and prognosis  - No RT or RD on 360 scleral depressed exam  - Reviewed s/s of RT/RD  - strict return precautions for any such RT/RD symptoms  7. Pseudophakia OU  - s/p CE/IOL OU by [name]  - beautiful surgeries, doing well  - continue to monitor  8. PCO OU  - pt reports some fogginess of vision - s/p YAG Cap OD (01.15.24) -- good opening -- BCVA improved to 20/30 from 20/40 - recommend yag cap OS at some point  9. Dry eyes OU  - persistent PEE on slit lamp exam.   - continue artificial tears and lubricating ointment as needed---patient not using  10. Ocular Hypertension OU  - IOP today 19 OU  - continue cosopt bid OU  Ophthalmic Meds Ordered  this visit:  Meds ordered this encounter  Medications   aflibercept (EYLEA) SOLN 2 mg     Return for f/u 4-5 weeks, NPDR OU, DFE, OCT.  There are no Patient Instructions on file for this visit.  This document serves as a record of services personally performed by Karie Chimera, MD, PhD. It was created on their behalf by Annalee Genta, COMT. The creation of this record is the provider's dictation and/or activities during the visit.  Electronically signed by: Annalee Genta, COMT 09/25/23 11:10 PM  This document serves as a record of services personally performed by Karie Chimera, MD, PhD. It was created on their behalf by Glee Arvin. Manson Passey, OA an ophthalmic technician. The creation of this record is the provider's dictation and/or  activities during the visit.    Electronically signed by: Glee Arvin. Manson Passey, OA 09/25/23 11:10 PM  Karie Chimera, M.D., Ph.D. Diseases & Surgery of the Retina and Vitreous [facility]  I have reviewed the above documentation for accuracy and completeness, and I agree with the above. Karie Chimera, M.D., Ph.D. 09/25/23 11:12 PM  Abbreviations: M myopia (nearsighted); A astigmatism; H hyperopia (farsighted); P presbyopia; Mrx spectacle prescription;  CTL contact lenses; OD right eye; OS left eye; OU both eyes  XT exotropia; ET esotropia; PEK punctate epithelial keratitis; PEE punctate epithelial erosions; DES dry eye syndrome; MGD meibomian gland dysfunction; ATs artificial tears; PFAT's preservative free artificial tears; NSC nuclear sclerotic cataract; PSC posterior subcapsular cataract; ERM epi-retinal membrane; PVD posterior vitreous detachment; RD retinal detachment; DM diabetes mellitus; DR diabetic retinopathy; NPDR non-proliferative diabetic retinopathy; PDR proliferative diabetic retinopathy; CSME clinically significant macular edema; DME diabetic macular edema; dbh dot blot hemorrhages; CWS cotton wool spot; POAG primary open angle glaucoma; C/D cup-to-disc ratio; HVF humphrey visual field; GVF goldmann visual field; OCT optical coherence tomography; IOP intraocular pressure; BRVO Branch retinal vein occlusion; CRVO central retinal vein occlusion; CRAO central retinal artery occlusion; BRAO branch retinal artery occlusion; RT retinal tear; SB scleral buckle; PPV pars plana vitrectomy; VH Vitreous hemorrhage; PRP panretinal laser photocoagulation; IVK intravitreal kenalog; VMT vitreomacular traction; MH Macular hole;  NVD neovascularization of the disc; NVE neovascularization elsewhere; AREDS age related eye disease study; ARMD age related macular degeneration; POAG primary open angle glaucoma; EBMD epithelial/anterior basement membrane dystrophy; ACIOL anterior chamber  intraocular lens; IOL intraocular lens; PCIOL posterior chamber intraocular lens; Phaco/IOL phacoemulsification with intraocular lens placement; PRK photorefractive keratectomy; LASIK laser assisted in situ keratomileusis; HTN hypertension; DM diabetes mellitus; COPD chronic obstructive pulmonary disease

## 2023-10-04 ENCOUNTER — Other Ambulatory Visit (INDEPENDENT_AMBULATORY_CARE_PROVIDER_SITE_OTHER): Payer: Self-pay

## 2023-10-04 MED ORDER — DORZOLAMIDE HCL-TIMOLOL MAL 2-0.5 % OP SOLN: 1.0000 [drp] | Freq: Two times a day (BID) | OPHTHALMIC | 6 refills | Status: DC

## 2023-10-05 ENCOUNTER — Other Ambulatory Visit (INDEPENDENT_AMBULATORY_CARE_PROVIDER_SITE_OTHER): Payer: Self-pay

## 2023-10-05 MED ORDER — DORZOLAMIDE HCL-TIMOLOL MAL 2-0.5 % OP SOLN: 1.0000 [drp] | Freq: Two times a day (BID) | OPHTHALMIC | 6 refills | Status: DC

## 2023-10-24 NOTE — Progress Notes (Signed)
[facility] - Clinic Note  10/30/2023     CHIEF COMPLAINT Patient presents for Retina Follow Up   HISTORY OF PRESENT ILLNESS: Monica Hamilton is a 82 y.o. female who presents to the clinic today for:   HPI     Retina Follow Up   Patient presents with  Diabetic Retinopathy.  In both eyes.  This started 5 weeks ago.  Duration of 5 weeks.  Since onset it is stable.  I, the attending physician,  performed the HPI with the patient and updated documentation appropriately.        Comments   5 week retina follow up NPDR and IVE OD pt is reporting no vision changes noticed she denies any flashes or floaters       Last edited by Rennis Chris, MD on 10/31/2023  9:51 PM.     Pt states she can't tell any difference in vision from the first day she started coming here until today, she states occasionally when she is looking at something, it will move to the left, it has only happened 3-4 times, she is using Cosopt BID OU and AT's PRN  Referring physician: Self-referral -- wife of Phallyn Maund  HISTORICAL INFORMATION:  Selected notes from the MEDICAL RECORD NUMBER Self referral Ocular Hx: pseudophakia OU [name] 2019) PMH: DM (on metformin), HTN, arthritis,    CURRENT MEDICATIONS: Current Outpatient Medications (Ophthalmic Drugs)  Medication Sig   dorzolamide-timolol (COSOPT) 2-0.5 % ophthalmic solution Place 1 drop into both eyes 2 (two) times daily.   No current facility-administered medications for this visit. (Ophthalmic Drugs)   Current Outpatient Medications (Other)  Medication Sig   amLODipine (NORVASC) 5 MG tablet Take 5 mg by mouth daily.   aspirin 81 MG EC tablet Take 81 mg by mouth daily.    CINNAMON PO Take 1,000 mg by mouth daily.   gabapentin (NEURONTIN) 100 MG capsule gabapentin 100 mg capsule  TAKE 1 CAPSULE BY MOUTH ONCE DAILY AT BEDTIME FOR 30 DAYS   gabapentin (NEURONTIN) 300 MG capsule Take 600 mg by mouth at bedtime.    HYDROcodone-acetaminophen (NORCO) 7.5-325 MG tablet Take 1 tablet by mouth every 6 (six) hours as needed for severe pain ((score 7 to 10)).   metFORMIN (GLUCOPHAGE) 1000 MG tablet Take 1,000 mg by mouth 2 (two) times daily.    methocarbamol (ROBAXIN) 500 MG tablet Take 1 tablet (500 mg total) by mouth every 6 (six) hours as needed for muscle spasms.   metoprolol succinate (TOPROL-XL) 50 MG 24 hr tablet Take 50 mg by mouth daily. Take with or immediately following a meal.   MODERNA COVID-19 VACCINE 100 MCG/0.5ML injection    Multiple Vitamins-Minerals (ICAPS AREDS 2) CAPS Take 1 capsule by mouth 2 (two) times daily.    rosuvastatin (CRESTOR) 20 MG tablet Take by mouth.   valsartan-hydrochlorothiazide (DIOVAN-HCT) 160-25 MG tablet Take 1 tablet by mouth daily.   No current facility-administered medications for this visit. (Other)   REVIEW OF SYSTEMS: ROS   Positive for: Musculoskeletal, Endocrine, Cardiovascular, Eyes Negative for: Constitutional, Gastrointestinal, Neurological, Skin, Genitourinary, HENT, Respiratory, Psychiatric, Allergic/Imm, Heme/Lymph Last edited by Etheleen Mayhew, COT on 10/30/2023  1:35 PM.     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    Age 36, led to Mitral valve prolapse   Past Surgical History:  Procedure Laterality Date   ABDOMINAL HYSTERECTOMY     APPENDECTOMY     BLADDER SUSPENSION     BREAST BIOPSY Left    BREAST EXCISIONAL BIOPSY Left    CATARACT EXTRACTION     CHOLECYSTECTOMY      DILATION AND CURETTAGE OF UTERUS     EYE SURGERY     HEAD & NECK SKIN LESION EXCISIONAL BIOPSY     top left scalp   LUMBAR LAMINECTOMY/DECOMPRESSION MICRODISCECTOMY N/A 08/21/2019   Procedure: Lumbar decompression L3-4 right, microdiscectomy L3-4 right;  Surgeon: Ranee Gosselin, MD;  Location: [facility];  Service: Orthopedics;  Laterality: N/A;    SHOULDER ARTHROSCOPY WITH SUBACROMIAL DECOMPRESSION Right 01/30/2013   Procedure: SHOULDER ARTHROSCOPY WITH SUBACROMIAL DECOMPRESSION;  Surgeon: Drucilla Schmidt, MD;  Location: [facility];  Service: Orthopedics;  Laterality: Right;  with labral debridement   FAMILY HISTORY Family History  Problem Relation Age of Onset   Heart disease Father    Breast cancer Maternal Aunt    Diabetes Brother    Leukemia Sister    Colon cancer Neg Hx    Esophageal cancer Neg Hx    Rectal cancer Neg Hx    Stomach cancer Neg Hx    SOCIAL HISTORY Social History   Tobacco Use   Smoking status: Never   Smokeless tobacco: Never  Vaping Use   Vaping status: Never Used  Substance Use Topics   Alcohol use: No   Drug use: No       OPHTHALMIC EXAM: Base Eye Exam     Visual Acuity (Snellen - Linear)       Right Left   Dist [place] 20/40 -2 20/250 -1   Dist ph [place] NI NI         Tonometry (Tonopen, 1:38 PM)       Right Left   Pressure 17 18         Pupils       Pupils Dark Light Shape React APD   Right PERRL 3 2 Round Brisk None   Left PERRL 3 2 Round Brisk None         Visual Fields       Left Right    Full Full         Extraocular Movement       Right Left    Full, Ortho Full, Ortho         Neuro/Psych     Oriented x3: Yes   Mood/Affect: Normal         Dilation     Both eyes: 2.5% Phenylephrine @ 1:38 PM           Slit Lamp and Fundus Exam     Slit Lamp Exam       Right Left   Lids/Lashes Dermatochalasis - upper lid Dermatochalasis - upper lid, Meibomian gland dysfunction   Conjunctiva/Sclera nasal and  temporal pinguecula Nasal Pinguecula   Cornea tear film debris, 3+ PEE, mild arcus, well healed cataract wound 3+ Punctate epithelial erosions, Arcus, Temporal Well healed cataract wounds, trace  tear film debris, decreased TBUT   Anterior Chamber Deep and quiet Deep and quiet   Iris Poorly dilated Round and dilated   Lens PC IOL in good position, Open posterior capsule PC IOL in good position, 1-2+Posterior capsular opacification   Anterior Vitreous Vitreous syneresis, Posterior vitreous detachment, vitreous condensations Vitreous syneresis, Posterior vitreous detachment         Fundus Exam       Right Left   Disc Pink and Sharp Pink and Sharp, mild temporal Peripapillary atrophy, mild PPP   C/D Ratio 0.5 0.4   Macula Flat, Blunted foveal reflex, RPE mottling and clumping, mild atrophy, Drusen, rare MA, focal IRH and edema / cystic changes temporal mac -- slightly improved Blunted foveal reflex, +CNV with pigment ring -- no surrounding edema -- stably improved, no heme, +drusen, RPE mottling   Vessels attenuated, Tortuous attenuated, Tortuous   Periphery Attached, scattered mild Reticular degeneration, no heme Attached, scattered Reticular degeneration, No heme.           IMAGING AND PROCEDURES  Imaging and Procedures for @TODAY @  OCT, Retina - OU - Both Eyes       Right Eye Quality was good. Central Foveal Thickness: 278. Progression has improved. Findings include normal foveal contour, no SRF, myopic contour, retinal drusen , intraretinal fluid, outer retinal atrophy (persistent IRF/cystic changes IT macula and fovea -- slightly improved, Patchy ORA -- stable).   Left Eye Quality was good. Central Foveal Thickness: 260. Progression has been stable. Findings include no IRF, no SRF, abnormal foveal contour, retinal drusen , subretinal hyper-reflective material, intraretinal hyper-reflective material, pigment epithelial detachment, outer retinal atrophy (Persistent focal PED/CNV with  stable improvement in overlying SRHM and retinal edema).   Notes *Images captured and stored on drive  Diagnosis / Impression: Mild DME OU OD: persistent IRF/cystic changes IT macula and fovea -- slightly improved, Patchy ORA -- stable OS: Exudative ARMD - Persistent focal PED/CNV with stable improvement in overlying SRHM and retinal edema -- nasal macula  Clinical management:  See below  Abbreviations: NFP - Normal foveal profile. CME - cystoid macular edema. PED - pigment epithelial detachment. IRF - intraretinal fluid. SRF - subretinal fluid. EZ - ellipsoid zone. ERM - epiretinal membrane. ORA - outer retinal atrophy. ORT - outer retinal tubulation. SRHM - subretinal hyper-reflective material       Intravitreal Injection, Pharmacologic Agent - OD - Right Eye       Time Out 10/30/2023. 1:59 PM. Confirmed correct patient, procedure, site, and patient consented.   Anesthesia Topical anesthesia was used. Anesthetic medications included Lidocaine 2%, Proparacaine 0.5%.   Procedure Preparation included 5% betadine to ocular surface, eyelid speculum. A (32g) needle was used.   Injection: 2 mg aflibercept 2 MG/0.05ML   Route: Intravitreal, Site: Right Eye   NDC: [id number], Lot: [id number], Expiration date: 04/20/2024, Waste: 0 mL   Post-op Post injection exam found visual acuity of at least counting fingers. The patient tolerated the procedure well. There were no complications. The patient received written and verbal post procedure care education. Post injection medications were not given.            ASSESSMENT/PLAN:   ICD-10-CM   1. Exudative age-related macular degeneration of left eye with active choroidal neovascularization (HCC)  H35.3221 OCT, Retina - OU - Both Eyes    Intravitreal Injection, Pharmacologic Agent - OD - Right Eye    aflibercept (EYLEA) SOLN 2 mg    2. Intermediate stage nonexudative age-related macular degeneration  of right eye  H35.3112     3.  Both eyes affected by mild nonproliferative diabetic retinopathy with macular edema, associated with type 2 diabetes mellitus (HCC)  E11.3213 OCT, Retina - OU - Both Eyes    4. Long term (current) use of oral hypoglycemic drugs  Z79.84     5. Amblyopia of left eye  H53.002     6. Posterior vitreous detachment of left eye  H43.812     7. Pseudophakia of both eyes  Z96.1     8. Bilateral posterior capsular opacification  H26.493     9. 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, OS   - interval conversion to exudative ARMD noted on 7.26.22 - s/p IVA OS #1 (07.26.22), #2 (08.23.22), #3 (09.21.22), #4 (10.13.22), #5 (11.16.22), #6 (12.14.22), #7 (01.11.23), #8 (02.08.23), #9 (03.22.23), #10 (06.07.23), #11 (08.02.23), #12 (10.25.23), #13 (12.29.23)  - BCVA 20/250 OS (stable and at amblyopic baseline) - OCT shows OS: Persistent focal PED/CNV with stable improvement in overlying SRHM and retinal edema -- nasal macula -- at ~12 mos since last IVA OS - will hold off on injection OS again today - pt in agreement - IVA informed consent obtained and signed, 07.26.22 (OS) - f/u in 5 wks, DFE/OCT, possible injxn  2. Intermediate Age related macular degeneration, non-exudative, OD -- stable - exam/OCT today shows OD: persistent IRF/cystic changes IT macula and fovea -- slightly improved; Patchy ORA - stable  - suspect cystic changes mostly related to DM and HTN  - BCVA OD 20/40 from 20/30  - FA 01.04.21 - no CNVM OU -- no exudative disease  - continue amsler grid monitoring  3,4. Mild non-proliferative diabetic retinopathy, both eyes  - A1c: 6.2 on 10.09.24 - s/p IVA OD #1 (01.29.24), #2 (02.26.24), #3 (03.25.24), #4 (04.22.24), #5 (05.28.24) -- IVA resistance - s/p IVE OD #1 (07.09.24), #2 (08.12.24), #3 (09.09.24), #4 (10.07.24) - exam shows rare MA/IRH - FA 01.04.21 - late leaking, perifoveal MA OU; no NV OU - OCT  OD shows persistent IRF/cystic changes IT macula and fovea -- slightly improved, Patchy ORA -- stable at 5 wks - BCVA OD 20/40 -- decreased from 20/30  - recommend IVE OD #5 today, 11.04.24 with follow up at 5 weeks again  - Pt wishes to proceed w/ injection - RBA of procedure discussed, questions answered - IVE informed consent obtained and signed 07.09.24 - see procedure note  - f/u 5 weeks DFE, OCT, likely injection OD  5. Severe amblyopia OS  - history of patching as a child -- no strabismus surgery - VA dropped down to 20/300 from 20/250 (baseline) due to conv to exu ARMD on 7.26.22  6. PVD / vitreous syneresis OS  - Discussed findings and prognosis  - No RT or RD on 360 scleral depressed exam  - Reviewed s/s of RT/RD  - strict return precautions for any such RT/RD symptoms  7. Pseudophakia OU  - s/p CE/IOL OU by [name]  - beautiful surgeries, doing well  - continue to monitor  8. PCO OU  - pt reports some fogginess of vision - s/p YAG Cap OD (01.15.24) -- good opening -- BCVA initially improved to 20/30 from 20/40 - recommend yag cap OS at some point  9. Dry eyes OU  - persistent PEE on slit lamp exam.   - continue artificial tears and lubricating ointment  as needed---patient not using  10. Ocular Hypertension OU  - IOP today 17,18  - continue cosopt bid OU  Ophthalmic Meds Ordered this visit:  Meds ordered this encounter  Medications   aflibercept (EYLEA) SOLN 2 mg     Return in about 5 weeks (around 12/04/2023) for f/u NPDR OU, DFE, OCT.  There are no Patient Instructions on file for this visit.  This document serves as a record of services personally performed by Karie Chimera, MD, PhD. It was created on their behalf by Glee Arvin. Manson Passey, OA an ophthalmic technician. The creation of this record is the provider's dictation and/or activities during the visit.    Electronically signed by: Glee Arvin. Manson Passey, OA 10/31/23 9:51 PM  Karie Chimera, M.D.,  Ph.D. Diseases & Surgery of the Retina and Vitreous [facility]  I have reviewed the above documentation for accuracy and completeness, and I agree with the above. Karie Chimera, M.D., Ph.D. 10/31/23 9:53 PM   Abbreviations: M myopia (nearsighted); A astigmatism; H hyperopia (farsighted); P presbyopia; Mrx spectacle prescription;  CTL contact lenses; OD right eye; OS left eye; OU both eyes  XT exotropia; ET esotropia; PEK punctate epithelial keratitis; PEE punctate epithelial erosions; DES dry eye syndrome; MGD meibomian gland dysfunction; ATs artificial tears; PFAT's preservative free artificial tears; NSC nuclear sclerotic cataract; PSC posterior subcapsular cataract; ERM epi-retinal membrane; PVD posterior vitreous detachment; RD retinal detachment; DM diabetes mellitus; DR diabetic retinopathy; NPDR non-proliferative diabetic retinopathy; PDR proliferative diabetic retinopathy; CSME clinically significant macular edema; DME diabetic macular edema; dbh dot blot hemorrhages; CWS cotton wool spot; POAG primary open angle glaucoma; C/D cup-to-disc ratio; HVF humphrey visual field; GVF goldmann visual field; OCT optical coherence tomography; IOP intraocular pressure; BRVO Branch retinal vein occlusion; CRVO central retinal vein occlusion; CRAO central retinal artery occlusion; BRAO branch retinal artery occlusion; RT retinal tear; SB scleral buckle; PPV pars plana vitrectomy; VH Vitreous hemorrhage; PRP panretinal laser photocoagulation; IVK intravitreal kenalog; VMT vitreomacular traction; MH Macular hole;  NVD neovascularization of the disc; NVE neovascularization elsewhere; AREDS age related eye disease study; ARMD age related macular degeneration; POAG primary open angle glaucoma; EBMD epithelial/anterior basement membrane dystrophy; ACIOL anterior chamber intraocular lens; IOL intraocular lens; PCIOL posterior chamber intraocular lens; Phaco/IOL phacoemulsification with  intraocular lens placement; PRK photorefractive keratectomy; LASIK laser assisted in situ keratomileusis; HTN hypertension; DM diabetes mellitus; COPD chronic obstructive pulmonary disease

## 2023-10-28 IMAGING — MR MR LUMBAR SPINE WO/W CM
4 of 7 series · 29 of 48 positions shown · IV contrast (6 ML GADAVIST)
Comparison: MRI 09/15/2020

CLINICAL DATA: DDD (degenerative disc disease), lumbar
(3FF-69-CM)

EXAM:
MRI LUMBAR SPINE WITHOUT AND WITH CONTRAST
TECHNIQUE: Multiplanar and multiecho pulse sequences of the lumbar spine were
obtained without and with intravenous contrast.
CONTRAST:  6mL GADAVIST GADOBUTROL 1 MMOL/ML IV SOLN

[Series 5: T1 · sagittal · 4.0mm · 0.81mm/px · 3 of 17 slices shown (1 of 2)]
[im 1/17]
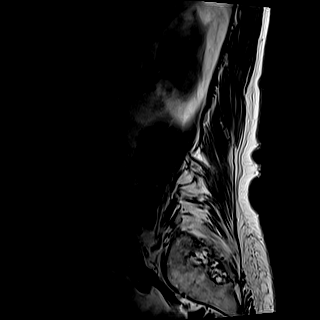
[im 9/17]
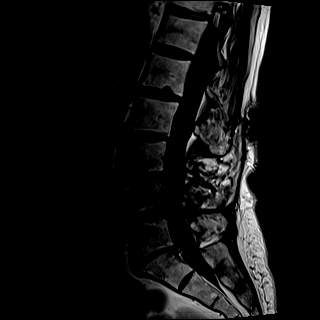
[im 17/17]
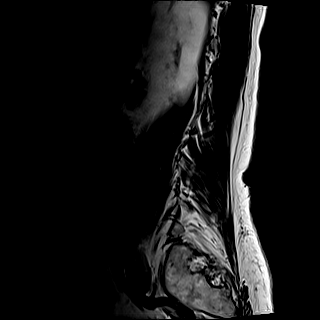

[Series 7: T2 · axial · 4.0mm · 0.62mm/px · z∈[-3,+216]mm · 11 of 45 slices shown]
[im 1/45]
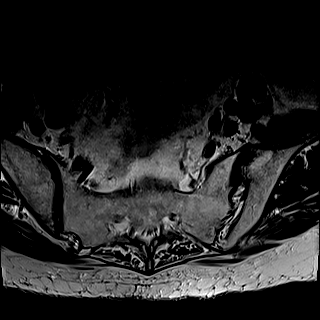
[im 5/45]
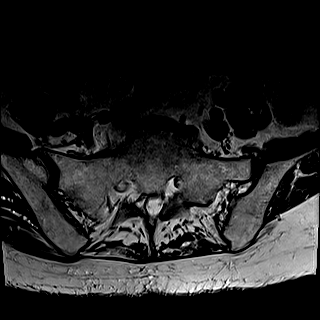
[im 9/45]
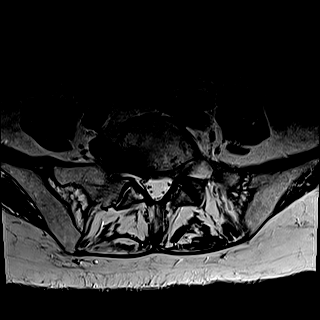
[im 14/45]
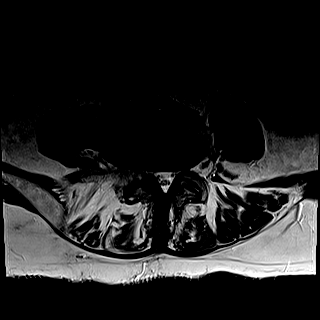
[im 18/45]
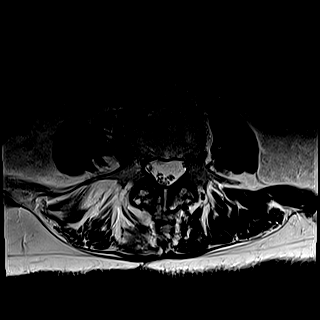
[im 23/45]
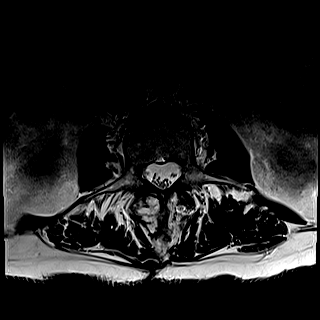
[im 27/45]
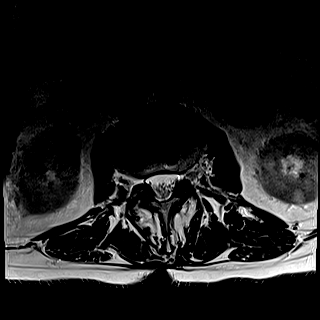
[im 31/45]
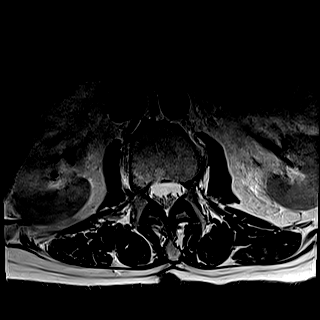
[im 36/45]
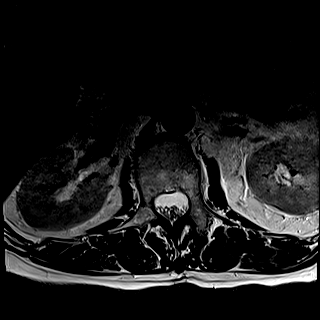
[im 40/45]
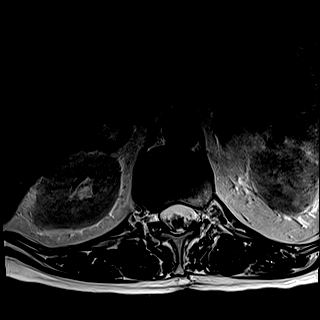
[im 45/45]
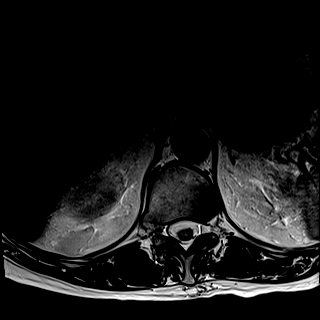

[Series 8: T1 · axial · 4.0mm · 0.39mm/px · z∈[-3,+216]mm · 11 of 45 slices shown (2 of 2)]
[im 1/45]
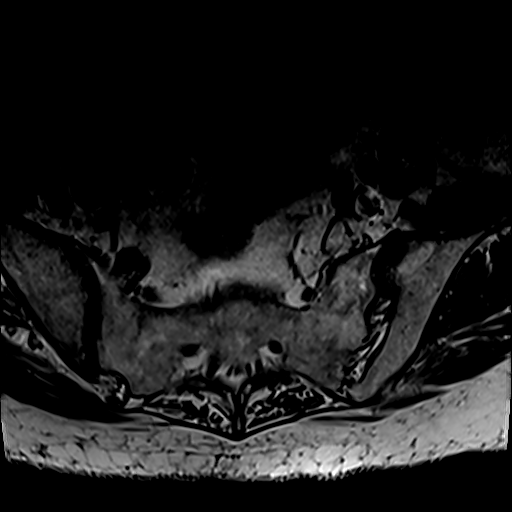
[im 5/45]
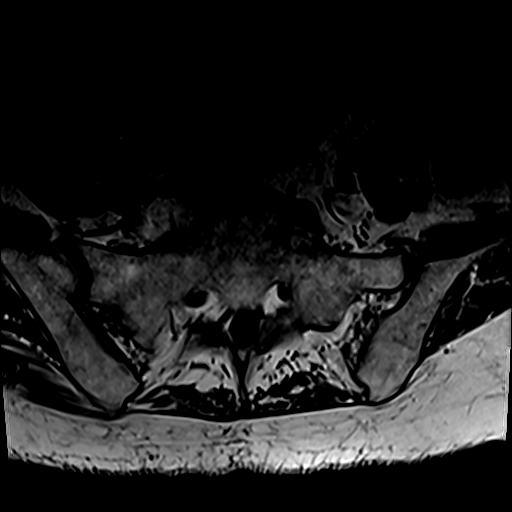
[im 9/45]
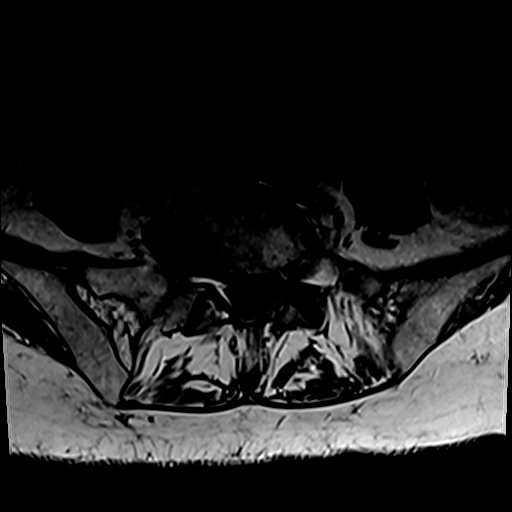
[im 14/45]
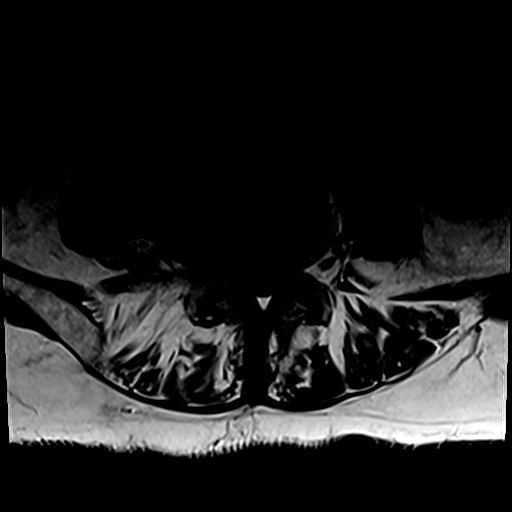
[im 18/45]
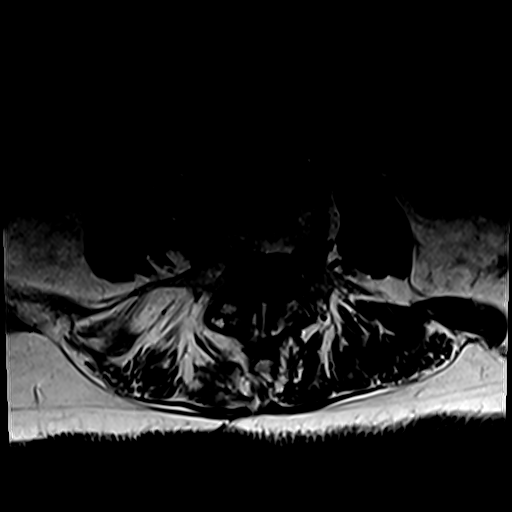
[im 23/45]
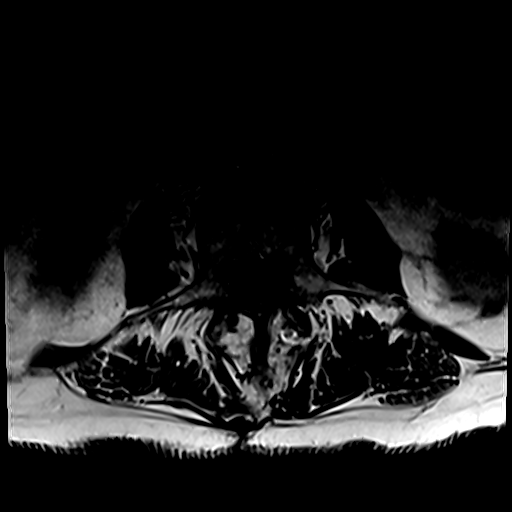
[im 27/45]
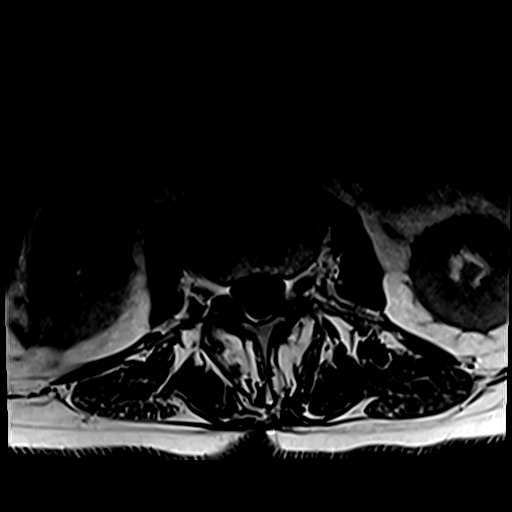
[im 31/45]
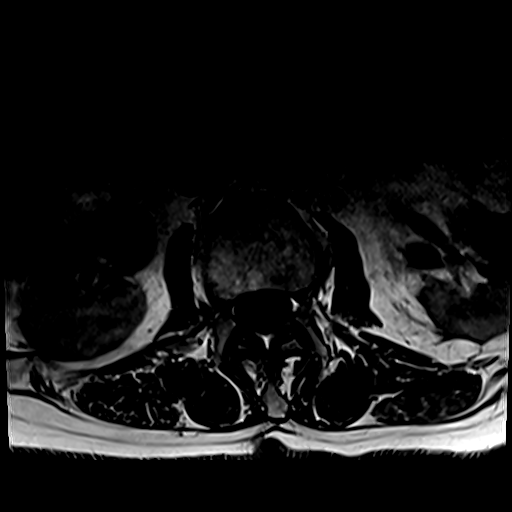
[im 36/45]
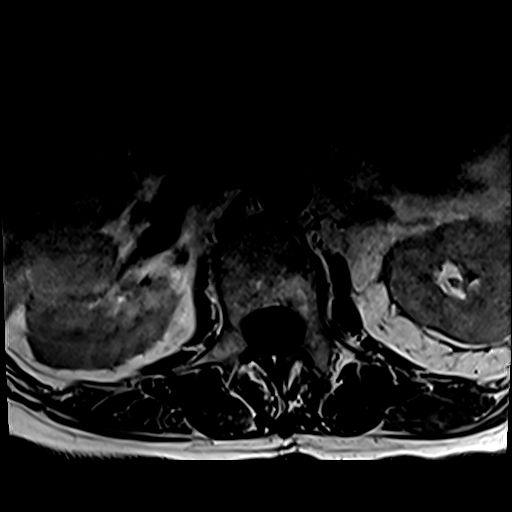
[im 40/45]
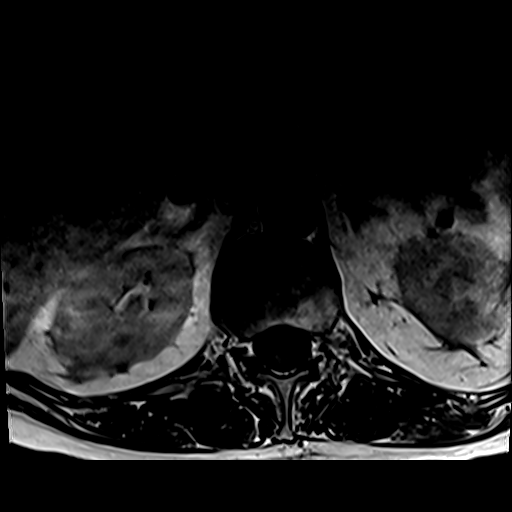
[im 45/45]
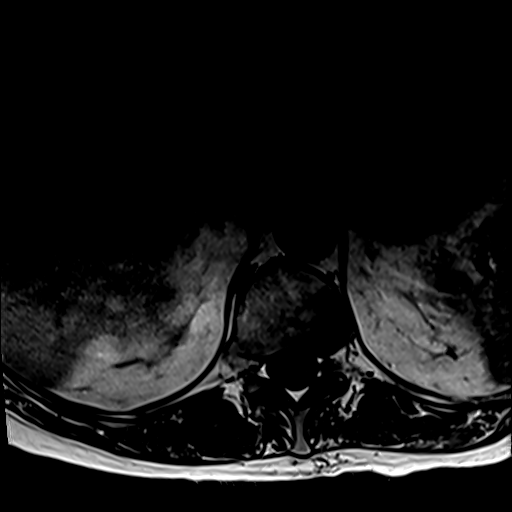

[Series 9: T2 post-contrast · sagittal · 4.0mm · 0.81mm/px · 4 of 17 slices shown]
[im 1/17]
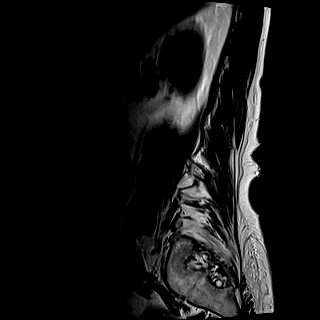
[im 6/17]
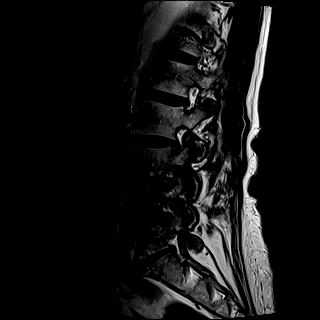
[im 11/17]
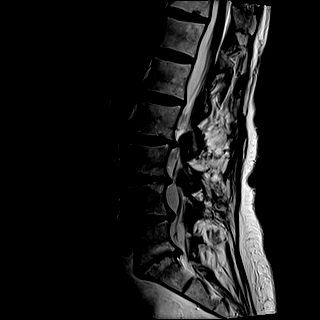
[im 17/17]
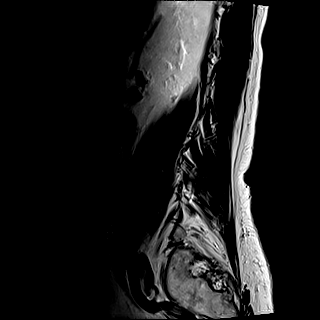

[29 of 48 positions shown; findings below may reference images not displayed]

FINDINGS: Segmentation:  Standard.

Alignment:  No significant static listhesis.

Vertebrae: No fracture, evidence of discitis, or bone lesion.
Multilevel discogenic endplate marrow changes, most pronounced at
L4-5.

Conus medullaris and cauda equina: Conus extends to the L1 level.
Conus and cauda equina appear normal.

Paraspinal and other soft tissues: Negative.

Disc levels:

T12-L1: No significant disc protrusion, foraminal stenosis, or canal
stenosis.

L1-L2: No significant disc protrusion, foraminal stenosis, or canal
stenosis.

L2-L3: Mild annular disc bulge. Mild bilateral facet arthropathy and
ligamentum flavum buckling. Mild left foraminal stenosis. No canal
stenosis. Unchanged.

L3-L4: Prior posterior decompression. Mild diffuse disc bulge with
endplate osteophytic ridging. Moderate bilateral facet arthropathy.
Moderate canal stenosis with moderate right and mild left foraminal
stenosis. Findings slightly progressed from prior.

L4-L5: Progressive disc height loss with mild diffuse disc bulge and
endplate ridging. Bilateral facet arthropathy with ligamentum flavum
buckling. Moderate right foraminal stenosis. No significant canal
stenosis. Findings slightly progressed.

L5-S1: Mild disc bulge and endplate ridging. Mild bilateral facet
arthropathy. Mild left foraminal stenosis. No canal stenosis.
Unchanged.
IMPRESSION: 1. Mildly progressed lumbar spondylosis compared to prior MRI
09/15/2020.
[DATE]. Moderate canal stenosis with moderate right and mild left
foraminal stenosis at L3-4.
3. Moderate right foraminal stenosis at L4-5. Mild left foraminal
stenosis at L2-3 and L5-S1.

## 2023-10-30 ENCOUNTER — Ambulatory Visit (INDEPENDENT_AMBULATORY_CARE_PROVIDER_SITE_OTHER): Payer: Medicare HMO | Admitting: Ophthalmology

## 2023-10-30 ENCOUNTER — Encounter (INDEPENDENT_AMBULATORY_CARE_PROVIDER_SITE_OTHER): Payer: Self-pay | Admitting: Ophthalmology

## 2023-10-30 DIAGNOSIS — Z7984 Long term (current) use of oral hypoglycemic drugs: Secondary | ICD-10-CM | POA: Diagnosis not present

## 2023-10-30 DIAGNOSIS — H04123 Dry eye syndrome of bilateral lacrimal glands: Secondary | ICD-10-CM

## 2023-10-30 DIAGNOSIS — H53002 Unspecified amblyopia, left eye: Secondary | ICD-10-CM

## 2023-10-30 DIAGNOSIS — Z961 Presence of intraocular lens: Secondary | ICD-10-CM | POA: Diagnosis not present

## 2023-10-30 DIAGNOSIS — H43812 Vitreous degeneration, left eye: Secondary | ICD-10-CM

## 2023-10-30 DIAGNOSIS — H353112 Nonexudative age-related macular degeneration, right eye, intermediate dry stage: Secondary | ICD-10-CM

## 2023-10-30 DIAGNOSIS — H353221 Exudative age-related macular degeneration, left eye, with active choroidal neovascularization: Secondary | ICD-10-CM | POA: Diagnosis not present

## 2023-10-30 DIAGNOSIS — H26493 Other secondary cataract, bilateral: Secondary | ICD-10-CM

## 2023-10-30 DIAGNOSIS — H40053 Ocular hypertension, bilateral: Secondary | ICD-10-CM

## 2023-10-30 DIAGNOSIS — E113213 Type 2 diabetes mellitus with mild nonproliferative diabetic retinopathy with macular edema, bilateral: Secondary | ICD-10-CM

## 2023-10-30 MED ORDER — AFLIBERCEPT 2MG/0.05ML IZ SOLN FOR KALEIDOSCOPE
2.0000 mg | INTRAVITREAL | Status: AC | PRN
Start: 2023-10-30 — End: 2023-10-30
  Administered 2023-10-30: 2 mg via INTRAVITREAL

## 2023-11-24 NOTE — Progress Notes (Signed)
 [facility] - Clinic Note  12/04/2023     CHIEF COMPLAINT Patient presents for Retina Follow Up   HISTORY OF PRESENT ILLNESS: Monica Hamilton is a 83 y.o. female who presents to the clinic today for:   HPI     Retina Follow Up   Patient presents with  Diabetic Retinopathy.  In both eyes.  This started 5 weeks ago.  Duration of weeks.  Since onset it is stable.  I, the attending physician,  performed the HPI with the patient and updated documentation appropriately.        Comments   5 week retina follow up NPDR OU and IVE OD pt is reporting no vision changes noticed she denies any flashes some floaters every now and then her last reading several weeks ago she is not checking she is using dorz/tim bid ou       Last edited by Valdemar Rogue, MD on 12/04/2023  5:58 PM.     Referring physician: Self-referral -- wife of Elgin Seats  HISTORICAL INFORMATION:  Selected notes from the MEDICAL RECORD NUMBER Self referral Ocular Hx: pseudophakia OU [place] 2019) PMH: DM (on metformin), HTN, arthritis,    CURRENT MEDICATIONS: Current Outpatient Medications (Ophthalmic Drugs)  Medication Sig   dorzolamide -timolol  (COSOPT ) 2-0.5 % ophthalmic solution Place 1 drop into both eyes 2 (two) times daily.   No current facility-administered medications for this visit. (Ophthalmic Drugs)   Current Outpatient Medications (Other)  Medication Sig   amLODipine  (NORVASC ) 5 MG tablet Take 5 mg by mouth daily.   aspirin 81 MG EC tablet Take 81 mg by mouth daily.    CINNAMON PO Take 1,000 mg by mouth daily.   gabapentin  (NEURONTIN ) 100 MG capsule gabapentin  100 mg capsule  TAKE 1 CAPSULE BY MOUTH ONCE DAILY AT BEDTIME FOR 30 DAYS   gabapentin  (NEURONTIN ) 300 MG capsule Take 600 mg by mouth at bedtime.   HYDROcodone -acetaminophen  (NORCO) 7.5-325 MG tablet Take 1 tablet by mouth every 6 (six) hours as needed for severe pain ((score 7 to 10)).   metFORMIN (GLUCOPHAGE) 1000 MG  tablet Take 1,000 mg by mouth 2 (two) times daily.    methocarbamol  (ROBAXIN ) 500 MG tablet Take 1 tablet (500 mg total) by mouth every 6 (six) hours as needed for muscle spasms.   metoprolol  succinate (TOPROL -XL) 50 MG 24 hr tablet Take 50 mg by mouth daily. Take with or immediately following a meal.   MODERNA COVID-19 VACCINE 100 MCG/0.5ML injection    Multiple Vitamins-Minerals (ICAPS AREDS 2) CAPS Take 1 capsule by mouth 2 (two) times daily.    rosuvastatin (CRESTOR) 20 MG tablet Take by mouth.   valsartan -hydrochlorothiazide  (DIOVAN -HCT) 160-25 MG tablet Take 1 tablet by mouth daily.   No current facility-administered medications for this visit. (Other)   REVIEW OF SYSTEMS: ROS   Positive for: Musculoskeletal, Endocrine, Cardiovascular, Eyes Negative for: Constitutional, Gastrointestinal, Neurological, Skin, Genitourinary, HENT, Respiratory, Psychiatric, Allergic/Imm, Heme/Lymph Last edited by Resa Delon ORN, COT on 12/04/2023 12:50 PM.      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   Past Surgical History:  Procedure Laterality Date   ABDOMINAL HYSTERECTOMY     APPENDECTOMY     BLADDER SUSPENSION     BREAST BIOPSY Left    BREAST EXCISIONAL BIOPSY Left    CATARACT EXTRACTION     CHOLECYSTECTOMY     DILATION AND CURETTAGE OF UTERUS     EYE SURGERY     HEAD & NECK SKIN LESION EXCISIONAL BIOPSY     top left scalp   LUMBAR LAMINECTOMY/DECOMPRESSION  MICRODISCECTOMY N/A 08/21/2019   Procedure: Lumbar decompression L3-4 right, microdiscectomy L3-4 right;  Surgeon: Heide Ingle, MD;  Location: [facility];  Service: Orthopedics;  Laterality: N/A;    SHOULDER ARTHROSCOPY WITH SUBACROMIAL DECOMPRESSION Right 01/30/2013   Procedure: SHOULDER ARTHROSCOPY WITH SUBACROMIAL DECOMPRESSION;  Surgeon: Lynwood SHAUNNA Bern, MD;  Location: [facility];  Service: Orthopedics;  Laterality: Right;  with labral debridement   FAMILY HISTORY Family History  Problem Relation Age of Onset   Heart disease Father    Breast cancer Maternal Aunt    Diabetes Brother    Leukemia Sister    Colon cancer Neg Hx    Esophageal cancer Neg Hx    Rectal cancer Neg Hx    Stomach cancer Neg Hx    SOCIAL HISTORY Social History   Tobacco Use   Smoking status: Never   Smokeless tobacco: Never  Vaping Use   Vaping status: Never Used  Substance Use Topics   Alcohol  use: No   Drug use: No       OPHTHALMIC EXAM: Base Eye Exam     Visual Acuity (Snellen - Linear)       Right Left   Dist [place] 20/50 20/200 -2   Dist ph [place] NI NI         Tonometry (Tonopen, 12:54 PM)       Right Left   Pressure 18 18         Pupils       Pupils Dark Light Shape React APD   Right PERRL 3 2 Round Brisk None   Left PERRL 3 2 Round Brisk None         Visual Fields       Left Right    Full Full         Extraocular Movement       Right Left    Full, Ortho Full, Ortho         Neuro/Psych     Oriented x3: Yes   Mood/Affect: Normal         Dilation     Both eyes: 2.5% Phenylephrine  @ 12:54 PM           Slit Lamp and Fundus Exam     Slit Lamp Exam       Right Left   Lids/Lashes Dermatochalasis - upper lid Dermatochalasis - upper lid, Meibomian gland dysfunction   Conjunctiva/Sclera nasal and temporal pinguecula Nasal Pinguecula   Cornea tear film debris, 3+ PEE, mild arcus, well healed cataract wound 3+ Punctate epithelial erosions, Arcus,  Temporal Well healed cataract wounds, trace tear film debris, decreased TBUT   Anterior Chamber Deep and quiet Deep and quiet   Iris Poorly dilated Round and dilated   Lens PC IOL in good position,  Open posterior capsule PC IOL in good position, 1-2+Posterior capsular opacification   Anterior Vitreous Vitreous syneresis, Posterior vitreous detachment, vitreous condensations Vitreous syneresis, Posterior vitreous detachment         Fundus Exam       Right Left   Disc Pink and Sharp Pink and Sharp, mild temporal Peripapillary atrophy, mild PPP   C/D Ratio 0.5 0.4   Macula Flat, Blunted foveal reflex, RPE mottling and clumping, mild atrophy, Drusen, rare MA, focal IRH and edema / cystic changes temporal mac -- slightly improved Blunted foveal reflex, +CNV with pigment ring -- no surrounding edema -- stably improved, rare MA, +drusen, RPE mottling   Vessels attenuated, Tortuous attenuated, Tortuous   Periphery Attached, scattered mild Reticular degeneration, no heme Attached, scattered Reticular degeneration, No heme.           IMAGING AND PROCEDURES  Imaging and Procedures for @TODAY @  OCT, Retina - OU - Both Eyes       Right Eye Quality was good. Central Foveal Thickness: 278. Progression has improved. Findings include normal foveal contour, no SRF, myopic contour, retinal drusen , intraretinal fluid, outer retinal atrophy (persistent IRF/cystic changes IT macula and fovea -- slightly improved, Patchy ORA -- stable).   Left Eye Quality was good. Central Foveal Thickness: 262. Progression has been stable. Findings include no IRF, no SRF, abnormal foveal contour, retinal drusen , subretinal hyper-reflective material, intraretinal hyper-reflective material, pigment epithelial detachment, outer retinal atrophy (Persistent focal PED/CNV with stable improvement in overlying SRHM and retinal edema).   Notes *Images captured and stored on drive  Diagnosis / Impression: Mild DME OU OD:  persistent IRF/cystic changes IT macula and fovea -- slightly improved, Patchy ORA -- stable OS: Exudative ARMD - Persistent focal PED/CNV with stable improvement in overlying SRHM and retinal edema -- nasal macula  Clinical management:  See below  Abbreviations: NFP - Normal foveal profile. CME - cystoid macular edema. PED - pigment epithelial detachment. IRF - intraretinal fluid. SRF - subretinal fluid. EZ - ellipsoid zone. ERM - epiretinal membrane. ORA - outer retinal atrophy. ORT - outer retinal tubulation. SRHM - subretinal hyper-reflective material       Intravitreal Injection, Pharmacologic Agent - OD - Right Eye       Time Out 12/04/2023. 1:19 PM. Confirmed correct patient, procedure, site, and patient consented.   Anesthesia Topical anesthesia was used. Anesthetic medications included Lidocaine  2%, Proparacaine 0.5%.   Procedure Preparation included 5% betadine  to ocular surface, eyelid speculum. A (32g) needle was used.   Injection: 6 mg faricimab -svoa 6 MG/0.05ML   Route: Intravitreal, Site: Right Eye   NDC: 49757-903-98, Lot: [id number], Expiration date: 05/20/2025, Waste: 0 mL   Post-op Post injection exam found visual acuity of at least counting fingers. The patient tolerated the procedure well. There were no complications. The patient received written and verbal post procedure care education. Post injection medications were not given.   Notes **SAMPLE MEDICATION ADMINISTERED**           ASSESSMENT/PLAN:   ICD-10-CM   1. Exudative age-related macular degeneration of left eye with active choroidal neovascularization (HCC)  H35.3221 OCT, Retina - OU - Both Eyes    2. Intermediate stage nonexudative age-related macular degeneration of right eye  H35.3112     3. Both eyes affected by mild nonproliferative diabetic retinopathy with macular edema, associated with type 2 diabetes mellitus (HCC)  Z88.6786 Intravitreal Injection, Pharmacologic Agent - OD - Right Eye     faricimab -svoa (VABYSMO ) 6mg /0.54mL  intravitreal injection    4. Long term (current) use of oral hypoglycemic drugs  Z79.84     5. Amblyopia of left eye  H53.002     6. Posterior vitreous detachment of left eye  H43.812     7. Pseudophakia of both eyes  Z96.1     8. Bilateral posterior capsular opacification  H26.493     9. 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, OS   - interval conversion to exudative ARMD noted on 7.26.22 - s/p IVA OS #1 (07.26.22), #2 (08.23.22), #3 (09.21.22), #4 (10.13.22), #5 (11.16.22), #6 (12.14.22), #7 (01.11.23), #8 (02.08.23), #9 (03.22.23), #10 (06.07.23), #11 (08.02.23), #12 (10.25.23), #13 (12.29.23)  - BCVA 20/250 OS (stable and at amblyopic baseline) - OCT shows OS: Persistent focal PED/CNV with stable improvement in overlying SRHM and retinal edema -- nasal macula -- at ~12 mos since last IVA OS - will hold off on injection OS again today - pt in agreement - IVA informed consent obtained and signed, 07.26.22 (OS) - f/u in 5 wks, DFE/OCT, possible injxn  2. Intermediate Age related macular degeneration, non-exudative, OD -- stable - exam/OCT today shows OD: persistent IRF/cystic changes IT macula and fovea -- slightly improved; Patchy ORA - stable  - suspect cystic changes mostly related to DM and HTN  - BCVA OD 20/40 from 20/30  - FA 01.04.21 - no CNVM OU -- no exudative disease  - continue amsler grid monitoring  3,4. Mild non-proliferative diabetic retinopathy, both eyes  - A1c: 6.2 on 10.09.24 - s/p IVA OD #1 (01.29.24), #2 (02.26.24), #3 (03.25.24), #4 (04.22.24), #5 (05.28.24) -- IVA resistance - s/p IVE OD #1 (07.09.24), #2 (08.12.24), #3 (09.09.24), #4 (10.07.24), #5 (11.04.24) - exam shows rare MA/IRH - FA 01.04.21 - late leaking, perifoveal MA OU; no NV OU - OCT OD shows persistent IRF/cystic changes IT macula and fovea -- slightly improved, Patchy ORA --  stable at 5 wks - BCVA OD 20/50 -- decreased from 20/40 **discussed decreased efficacy / resistance to Eylea  and potential benefit of switching medication**   - recommend IVV OD #1 (sample) today, 01.13.25 with follow up at 5 weeks again  - Pt wishes to proceed w/ injection - RBA of procedure discussed, questions answered - IVV informed consent obtained and signed 01.13.25 - see procedure note  - f/u 5 weeks DFE, OCT, likely injection OD - will check Vabysmo  auth and assistance programs  5. Severe amblyopia OS  - history of patching as a child -- no strabismus surgery - VA dropped down to 20/300 from 20/250 (baseline) due to conv to exu ARMD on 7.26.22  6. PVD / vitreous syneresis OS  - Discussed findings and prognosis  - No RT or RD on 360 scleral depressed exam  - Reviewed s/s of RT/RD  - strict return precautions for any such RT/RD symptoms  7. Pseudophakia OU  - s/p CE/IOL OU by [place]oz  - beautiful surgeries, doing well  - continue to monitor  8. PCO OU  - pt reports some fogginess of vision - s/p YAG Cap OD (01.15.24) -- good opening -- BCVA initially improved to 20/30 from 20/40 - recommend yag cap OS at some point  9. Dry eyes OU  - persistent PEE on slit lamp exam.   - continue artificial tears and lubricating ointment as needed---patient not using  10. Ocular Hypertension OU  -  IOP today 18 (OU)  - continue cosopt  bid OU  Ophthalmic Meds Ordered this visit:  Meds ordered this encounter  Medications   faricimab -svoa (VABYSMO ) 6mg /0.7mL intravitreal injection     Return in about 5 weeks (around 01/08/2024) for f/u NPDR OU, DFE, OCT.  There are no Patient Instructions on file for this visit.  This document serves as a record of services personally performed by Redell JUDITHANN Hans, MD, PhD. It was created on their behalf by Alan PARAS. Delores, OA an ophthalmic technician. The creation of this record is the provider's dictation and/or activities during the visit.     Electronically signed by: Alan PARAS. Delores, OA 12/04/23 6:01 PM  Redell JUDITHANN Hans, M.D., Ph.D. Diseases & Surgery of the Retina and Vitreous [facility]  I have reviewed the above documentation for accuracy and completeness, and I agree with the above. Redell JUDITHANN Hans, M.D., Ph.D. 12/04/23 6:02 PM   Abbreviations: M myopia (nearsighted); A astigmatism; H hyperopia (farsighted); P presbyopia; Mrx spectacle prescription;  CTL contact lenses; OD right eye; OS left eye; OU both eyes  XT exotropia; ET esotropia; PEK punctate epithelial keratitis; PEE punctate epithelial erosions; DES dry eye syndrome; MGD meibomian gland dysfunction; ATs artificial tears; PFAT's preservative free artificial tears; NSC nuclear sclerotic cataract; PSC posterior subcapsular cataract; ERM epi-retinal membrane; PVD posterior vitreous detachment; RD retinal detachment; DM diabetes mellitus; DR diabetic retinopathy; NPDR non-proliferative diabetic retinopathy; PDR proliferative diabetic retinopathy; CSME clinically significant macular edema; DME diabetic macular edema; dbh dot blot hemorrhages; CWS cotton wool spot; POAG primary open angle glaucoma; C/D cup-to-disc ratio; HVF humphrey visual field; GVF goldmann visual field; OCT optical coherence tomography; IOP intraocular pressure; BRVO Branch retinal vein occlusion; CRVO central retinal vein occlusion; CRAO central retinal artery occlusion; BRAO branch retinal artery occlusion; RT retinal tear; SB scleral buckle; PPV pars plana vitrectomy; VH Vitreous hemorrhage; PRP panretinal laser photocoagulation; IVK intravitreal kenalog; VMT vitreomacular traction; MH Macular hole;  NVD neovascularization of the disc; NVE neovascularization elsewhere; AREDS age related eye disease study; ARMD age related macular degeneration; POAG primary open angle glaucoma; EBMD epithelial/anterior basement membrane dystrophy; ACIOL anterior chamber intraocular lens; IOL intraocular  lens; PCIOL posterior chamber intraocular lens; Phaco/IOL phacoemulsification with intraocular lens placement; PRK photorefractive keratectomy; LASIK laser assisted in situ keratomileusis; HTN hypertension; DM diabetes mellitus; COPD chronic obstructive pulmonary disease

## 2023-12-04 ENCOUNTER — Encounter (INDEPENDENT_AMBULATORY_CARE_PROVIDER_SITE_OTHER): Payer: Self-pay | Admitting: Ophthalmology

## 2023-12-04 ENCOUNTER — Ambulatory Visit (INDEPENDENT_AMBULATORY_CARE_PROVIDER_SITE_OTHER): Payer: Medicare HMO | Admitting: Ophthalmology

## 2023-12-04 DIAGNOSIS — H353221 Exudative age-related macular degeneration, left eye, with active choroidal neovascularization: Secondary | ICD-10-CM | POA: Diagnosis not present

## 2023-12-04 DIAGNOSIS — H04123 Dry eye syndrome of bilateral lacrimal glands: Secondary | ICD-10-CM

## 2023-12-04 DIAGNOSIS — Z7984 Long term (current) use of oral hypoglycemic drugs: Secondary | ICD-10-CM | POA: Diagnosis not present

## 2023-12-04 DIAGNOSIS — Z961 Presence of intraocular lens: Secondary | ICD-10-CM | POA: Diagnosis not present

## 2023-12-04 DIAGNOSIS — H43812 Vitreous degeneration, left eye: Secondary | ICD-10-CM

## 2023-12-04 DIAGNOSIS — H353112 Nonexudative age-related macular degeneration, right eye, intermediate dry stage: Secondary | ICD-10-CM

## 2023-12-04 DIAGNOSIS — H26493 Other secondary cataract, bilateral: Secondary | ICD-10-CM | POA: Diagnosis not present

## 2023-12-04 DIAGNOSIS — E113213 Type 2 diabetes mellitus with mild nonproliferative diabetic retinopathy with macular edema, bilateral: Secondary | ICD-10-CM | POA: Diagnosis not present

## 2023-12-04 DIAGNOSIS — H53002 Unspecified amblyopia, left eye: Secondary | ICD-10-CM

## 2023-12-04 DIAGNOSIS — H40053 Ocular hypertension, bilateral: Secondary | ICD-10-CM

## 2023-12-04 MED ORDER — FARICIMAB-SVOA 6 MG/0.05ML IZ SOLN
6.0000 mg | INTRAVITREAL | Status: AC | PRN
Start: 2023-12-04 — End: 2023-12-04
  Administered 2023-12-04: 6 mg via INTRAVITREAL

## 2024-01-01 NOTE — Progress Notes (Signed)
[facility] - Clinic Note  01/08/2024     CHIEF COMPLAINT Patient presents for Retina Follow Up   HISTORY OF PRESENT ILLNESS: Monica Hamilton is a 83 y.o. female who presents to the clinic today for:   HPI     Retina Follow Up   Patient presents with  Diabetic Retinopathy.  In both eyes.  This started 5 weeks ago.  Duration of 5 weeks.  Since onset it is stable.  I, the attending physician,  performed the HPI with the patient and updated documentation appropriately.        Comments   Patient feels the vision is the same. She is using AT's and Cosopt OU BID. She does not check her blood sugar.      Last edited by Rennis Chris, MD on 01/08/2024  1:22 PM.      Referring physician: Self-referral -- wife of Jesusita Oka  HISTORICAL INFORMATION:  Selected notes from the MEDICAL RECORD NUMBER Self referral Ocular Hx: pseudophakia OU [name] 2019) PMH: DM (on metformin), HTN, arthritis,    CURRENT MEDICATIONS: Current Outpatient Medications (Ophthalmic Drugs)  Medication Sig   dorzolamide-timolol (COSOPT) 2-0.5 % ophthalmic solution Place 1 drop into both eyes 2 (two) times daily.   No current facility-administered medications for this visit. (Ophthalmic Drugs)   Current Outpatient Medications (Other)  Medication Sig   amLODipine (NORVASC) 5 MG tablet Take 5 mg by mouth daily.   aspirin 81 MG EC tablet Take 81 mg by mouth daily.    CINNAMON PO Take 1,000 mg by mouth daily.   gabapentin (NEURONTIN) 100 MG capsule gabapentin 100 mg capsule  TAKE 1 CAPSULE BY MOUTH ONCE DAILY AT BEDTIME FOR 30 DAYS   gabapentin (NEURONTIN) 300 MG capsule Take 600 mg by mouth at bedtime.   HYDROcodone-acetaminophen (NORCO) 7.5-325 MG tablet Take 1 tablet by mouth every 6 (six) hours as needed for severe pain ((score 7 to 10)).   metFORMIN (GLUCOPHAGE) 1000 MG tablet Take 1,000 mg by mouth 2 (two) times daily.    methocarbamol (ROBAXIN) 500 MG tablet Take 1 tablet (500 mg  total) by mouth every 6 (six) hours as needed for muscle spasms.   metoprolol succinate (TOPROL-XL) 50 MG 24 hr tablet Take 50 mg by mouth daily. Take with or immediately following a meal.   MODERNA COVID-19 VACCINE 100 MCG/0.5ML injection    Multiple Vitamins-Minerals (ICAPS AREDS 2) CAPS Take 1 capsule by mouth 2 (two) times daily.    rosuvastatin (CRESTOR) 20 MG tablet Take by mouth.   valsartan-hydrochlorothiazide (DIOVAN-HCT) 160-25 MG tablet Take 1 tablet by mouth daily.   No current facility-administered medications for this visit. (Other)   REVIEW OF SYSTEMS: ROS   Positive for: Musculoskeletal, Endocrine, Cardiovascular, Eyes Negative for: Constitutional, Gastrointestinal, Neurological, Skin, Genitourinary, HENT, Respiratory, Psychiatric, Allergic/Imm, Heme/Lymph Last edited by Charlette Caffey, COT on 01/08/2024 12:43 PM.       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   Past Surgical History:  Procedure Laterality Date   ABDOMINAL HYSTERECTOMY     APPENDECTOMY     BLADDER SUSPENSION     BREAST BIOPSY Left    BREAST EXCISIONAL BIOPSY Left    CATARACT EXTRACTION     CHOLECYSTECTOMY     DILATION AND CURETTAGE OF UTERUS     EYE SURGERY     HEAD & NECK SKIN LESION EXCISIONAL BIOPSY     top left scalp   LUMBAR LAMINECTOMY/DECOMPRESSION MICRODISCECTOMY N/A 08/21/2019   Procedure: Lumbar decompression L3-4 right, microdiscectomy L3-4 right;  Surgeon: Ranee Gosselin, MD;  Location: [facility];  Service: Orthopedics;  Laterality: N/A;    SHOULDER ARTHROSCOPY WITH SUBACROMIAL DECOMPRESSION Right 01/30/2013   Procedure: SHOULDER ARTHROSCOPY WITH SUBACROMIAL DECOMPRESSION;  Surgeon: Drucilla Schmidt, MD;  Location: [facility];  Service: Orthopedics;  Laterality: Right;  with labral debridement   FAMILY HISTORY Family History  Problem Relation Age of Onset   Heart disease Father    Breast cancer Maternal Aunt    Diabetes Brother    Leukemia Sister    Colon cancer Neg Hx    Esophageal cancer Neg Hx    Rectal cancer Neg Hx    Stomach cancer Neg Hx    SOCIAL HISTORY Social History   Tobacco Use   Smoking status: Never   Smokeless tobacco: Never  Vaping Use   Vaping status: Never Used  Substance Use Topics   Alcohol use: No   Drug use: No       OPHTHALMIC EXAM: Base Eye Exam     Visual Acuity (Snellen - Linear)       Right Left   Dist [place] 20/50 20/200   Dist ph [place] NI NI         Tonometry (Tonopen, 12:46 PM)       Right Left   Pressure 11 13         Pupils       Dark Light Shape React APD   Right 3 2 Round Brisk None   Left 3 2 Round Brisk None         Visual Fields       Left Right    Full Full         Extraocular Movement       Right Left    Full, Ortho Full, Ortho         Neuro/Psych     Oriented x3: Yes   Mood/Affect: Normal         Dilation     Both eyes: 1.0% Mydriacyl, 2.5% Phenylephrine @ 12:44 PM           Slit Lamp and Fundus Exam     Slit Lamp Exam       Right Left   Lids/Lashes Dermatochalasis - upper lid Dermatochalasis - upper lid, Meibomian gland dysfunction   Conjunctiva/Sclera nasal and temporal pinguecula Nasal Pinguecula   Cornea tear film debris, 3+ PEE, mild arcus, well healed cataract wound 3+ Punctate epithelial erosions, Arcus, Temporal Well healed cataract wounds, trace tear film debris, decreased TBUT   Anterior Chamber Deep and quiet Deep and quiet   Iris  Poorly dilated Round and dilated   Lens PC IOL in good position, Open posterior capsule PC IOL in good position, 1-2+Posterior capsular opacification   Anterior Vitreous Vitreous syneresis, Posterior vitreous detachment, vitreous condensations Vitreous syneresis,  Posterior vitreous detachment         Fundus Exam       Right Left   Disc Pink and Sharp Pink and Sharp, mild temporal Peripapillary atrophy, mild PPP   C/D Ratio 0.5 0.4   Macula Flat, Blunted foveal reflex, RPE mottling and clumping, mild atrophy, Drusen, rare MA, focal IRH and edema / cystic changes temporal mac -- slightly improved Blunted foveal reflex, +CNV with pigment ring -- no surrounding edema -- stably improved, rare MA, +drusen, RPE mottling   Vessels attenuated, Tortuous attenuated, Tortuous   Periphery Attached, scattered mild Reticular degeneration, no heme Attached, scattered Reticular degeneration, No heme.           IMAGING AND PROCEDURES  Imaging and Procedures for @TODAY @  OCT, Retina - OU - Both Eyes       Right Eye Quality was good. Central Foveal Thickness: 267. Progression has improved. Findings include normal foveal contour, no SRF, myopic contour, retinal drusen , intraretinal fluid, outer retinal atrophy (persistent IRF/cystic changes IT macula and fovea -- slightly improved, Patchy ORA -- stable).   Left Eye Quality was good. Central Foveal Thickness: 259. Progression has been stable. Findings include no IRF, no SRF, abnormal foveal contour, retinal drusen , subretinal hyper-reflective material, intraretinal hyper-reflective material, pigment epithelial detachment, outer retinal atrophy (Persistent focal PED/CNV with stable improvement in overlying SRHM and retinal edema).   Notes *Images captured and stored on drive  Diagnosis / Impression: Mild DME OU OD: persistent IRF/cystic changes IT macula and fovea -- slightly improved, Patchy ORA -- stable OS: Exudative ARMD - Persistent focal  PED/CNV with stable improvement in overlying SRHM and retinal edema -- nasal macula  Clinical management:  See below  Abbreviations: NFP - Normal foveal profile. CME - cystoid macular edema. PED - pigment epithelial detachment. IRF - intraretinal fluid. SRF - subretinal fluid. EZ - ellipsoid zone. ERM - epiretinal membrane. ORA - outer retinal atrophy. ORT - outer retinal tubulation. SRHM - subretinal hyper-reflective material       Intravitreal Injection, Pharmacologic Agent - OD - Right Eye       Time Out 01/08/2024. 1:27 PM. Confirmed correct patient, procedure, site, and patient consented.   Anesthesia Topical anesthesia was used. Anesthetic medications included Lidocaine 2%, Proparacaine 0.5%.   Procedure Preparation included 5% betadine to ocular surface, eyelid speculum. A (32g) needle was used.   Injection: 6 mg faricimab-svoa 6 MG/0.05ML (Patient supplied)   Route: Intravitreal, Site: Right Eye   NDC: 16109-604-54, Lot: [id number], Expiration date: 05/20/2025, Waste: 0 mL   Post-op Post injection exam found visual acuity of at least counting fingers. The patient tolerated the procedure well. There were no complications. The patient received written and verbal post procedure care education. Post injection medications were not given.   Notes **SAMPLE MEDICATION ADMINISTERED**           ASSESSMENT/PLAN:   ICD-10-CM   1. Exudative age-related macular degeneration of left eye with active choroidal neovascularization (HCC)  H35.3221 OCT, Retina - OU - Both Eyes    2. Intermediate stage nonexudative age-related macular degeneration of right eye  H35.3112     3. Both eyes affected by mild nonproliferative diabetic retinopathy with macular edema, associated with type 2 diabetes mellitus (HCC)  E11.3213 OCT, Retina - OU - Both Eyes    Intravitreal Injection, Pharmacologic Agent - OD - Right Eye    faricimab-svoa (VABYSMO) 6mg /0.60mL intravitreal injection    4. Long term  (current) use of oral  hypoglycemic drugs  Z79.84     5. Amblyopia of left eye  H53.002     6. Posterior vitreous detachment of left eye  H43.812     7. Pseudophakia of both eyes  Z96.1     8. Bilateral posterior capsular opacification  H26.493     9. 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, OS   - interval conversion to exudative ARMD noted on 7.26.22 - s/p IVA OS #1 (07.26.22), #2 (08.23.22), #3 (09.21.22), #4 (10.13.22), #5 (11.16.22), #6 (12.14.22), #7 (01.11.23), #8 (02.08.23), #9 (03.22.23), #10 (06.07.23), #11 (08.02.23), #12 (10.25.23), #13 (12.29.23)  - BCVA 20/200 OS (stable and at amblyopic baseline) - OCT shows OS: Persistent focal PED/CNV with stable improvement in overlying SRHM and retinal edema -- nasal macula -- at ~14 mos since last IVA OS - will hold off on injection OS again today - pt in agreement - IVA informed consent obtained and signed, 07.26.22 (OS) - f/u in 5 wks, DFE/OCT  2. Intermediate Age related macular degeneration, non-exudative, OD -- stable - exam/OCT today shows OD: persistent IRF/cystic changes IT macula and fovea -- slightly improved; Patchy ORA - stable  - suspect cystic changes mostly related to DM and HTN  - BCVA OD 20/40 from 20/30  - FA 01.04.21 - no CNVM OU -- no exudative disease  - continue amsler grid monitoring  3,4. Mild non-proliferative diabetic retinopathy, both eyes  - A1c: 6.2 on 10.09.24 - s/p IVA OD #1 (01.29.24), #2 (02.26.24), #3 (03.25.24), #4 (04.22.24), #5 (05.28.24) -- IVA resistance - s/p IVE OD #1 (07.09.24), #2 (08.12.24), #3 (09.09.24), #4 (10.07.24), #5 (11.04.24) -- IVE resistance - s/p IVV OD #1 (SAMPLE -- 01.13.25) - exam shows rare MA/IRH - FA 01.04.21 - late leaking, perifoveal MA OU; no NV OU - OCT OD shows persistent IRF/cystic changes IT macula and fovea -- slightly improved, Patchy ORA -- stable at 5 wks - BCVA OD stable at  20/50  - recommend IVV OD #2 (sample) today, 02.17.25 with follow up at 5 weeks again  - Pt wishes to proceed w/ injection - RBA of procedure discussed, questions answered - IVV informed consent obtained and signed 01.13.25 - see procedure note  - f/u 5 weeks DFE, OCT, likely injection OD - will check [employer] auth and assistance programs  5. Severe amblyopia OS  - history of patching as a child -- no strabismus surgery - VA dropped down to 20/300 from 20/250 (baseline) due to conv to exu ARMD on 7.26.22  6. PVD / vitreous syneresis OS  - Discussed findings and prognosis  - No RT or RD on 360 scleral depressed exam  - Reviewed s/s of RT/RD  - strict return precautions for any such RT/RD symptoms  7. Pseudophakia OU  - s/p CE/IOL OU by [name]  - beautiful surgeries, doing well  - continue to monitor  8. PCO OU  - pt reports some fogginess of vision - s/p YAG Cap OD (01.15.24) -- good opening -- BCVA initially improved to 20/30 from 20/40 - recommend yag cap OS at some point  9. Dry eyes OU  - persistent PEE on slit lamp exam.   - continue artificial tears and lubricating ointment as needed---patient not using  10. Ocular Hypertension OU  - IOP today 11,13  - continue cosopt bid OU  Ophthalmic Meds Ordered this visit:  Meds ordered this  encounter  Medications   faricimab-svoa (VABYSMO) 6mg /0.9mL intravitreal injection     Return in about 5 weeks (around 02/12/2024) for DME OD, Dilated Exam, OCT, Possible Injxn.  There are no Patient Instructions on file for this visit.  This document serves as a record of services personally performed by Karie Chimera, MD, PhD. It was created on their behalf by Glee Arvin. Manson Passey, OA an ophthalmic technician. The creation of this record is the provider's dictation and/or activities during the visit.    Electronically signed by: Glee Arvin. Manson Passey, OA 01/08/24 4:54 PM   Karie Chimera, M.D., Ph.D. Diseases & Surgery of the Retina and  Vitreous [facility]  I have reviewed the above documentation for accuracy and completeness, and I agree with the above. Karie Chimera, M.D., Ph.D. 01/08/24 4:56 PM   Abbreviations: M myopia (nearsighted); A astigmatism; H hyperopia (farsighted); P presbyopia; Mrx spectacle prescription;  CTL contact lenses; OD right eye; OS left eye; OU both eyes  XT exotropia; ET esotropia; PEK punctate epithelial keratitis; PEE punctate epithelial erosions; DES dry eye syndrome; MGD meibomian gland dysfunction; ATs artificial tears; PFAT's preservative free artificial tears; NSC nuclear sclerotic cataract; PSC posterior subcapsular cataract; ERM epi-retinal membrane; PVD posterior vitreous detachment; RD retinal detachment; DM diabetes mellitus; DR diabetic retinopathy; NPDR non-proliferative diabetic retinopathy; PDR proliferative diabetic retinopathy; CSME clinically significant macular edema; DME diabetic macular edema; dbh dot blot hemorrhages; CWS cotton wool spot; POAG primary open angle glaucoma; C/D cup-to-disc ratio; HVF humphrey visual field; GVF goldmann visual field; OCT optical coherence tomography; IOP intraocular pressure; BRVO Branch retinal vein occlusion; CRVO central retinal vein occlusion; CRAO central retinal artery occlusion; BRAO branch retinal artery occlusion; RT retinal tear; SB scleral buckle; PPV pars plana vitrectomy; VH Vitreous hemorrhage; PRP panretinal laser photocoagulation; IVK intravitreal kenalog; VMT vitreomacular traction; MH Macular hole;  NVD neovascularization of the disc; NVE neovascularization elsewhere; AREDS age related eye disease study; ARMD age related macular degeneration; POAG primary open angle glaucoma; EBMD epithelial/anterior basement membrane dystrophy; ACIOL anterior chamber intraocular lens; IOL intraocular lens; PCIOL posterior chamber intraocular lens; Phaco/IOL phacoemulsification with intraocular lens placement; PRK photorefractive  keratectomy; LASIK laser assisted in situ keratomileusis; HTN hypertension; DM diabetes mellitus; COPD chronic obstructive pulmonary disease

## 2024-01-08 ENCOUNTER — Encounter (INDEPENDENT_AMBULATORY_CARE_PROVIDER_SITE_OTHER): Payer: Self-pay | Admitting: Ophthalmology

## 2024-01-08 ENCOUNTER — Ambulatory Visit (INDEPENDENT_AMBULATORY_CARE_PROVIDER_SITE_OTHER): Payer: Medicare HMO | Admitting: Ophthalmology

## 2024-01-08 DIAGNOSIS — H40053 Ocular hypertension, bilateral: Secondary | ICD-10-CM

## 2024-01-08 DIAGNOSIS — H53002 Unspecified amblyopia, left eye: Secondary | ICD-10-CM

## 2024-01-08 DIAGNOSIS — H353112 Nonexudative age-related macular degeneration, right eye, intermediate dry stage: Secondary | ICD-10-CM | POA: Diagnosis not present

## 2024-01-08 DIAGNOSIS — E113213 Type 2 diabetes mellitus with mild nonproliferative diabetic retinopathy with macular edema, bilateral: Secondary | ICD-10-CM

## 2024-01-08 DIAGNOSIS — Z961 Presence of intraocular lens: Secondary | ICD-10-CM

## 2024-01-08 DIAGNOSIS — H43812 Vitreous degeneration, left eye: Secondary | ICD-10-CM

## 2024-01-08 DIAGNOSIS — H353221 Exudative age-related macular degeneration, left eye, with active choroidal neovascularization: Secondary | ICD-10-CM | POA: Diagnosis not present

## 2024-01-08 DIAGNOSIS — Z7984 Long term (current) use of oral hypoglycemic drugs: Secondary | ICD-10-CM | POA: Diagnosis not present

## 2024-01-08 DIAGNOSIS — H26493 Other secondary cataract, bilateral: Secondary | ICD-10-CM

## 2024-01-08 DIAGNOSIS — H04123 Dry eye syndrome of bilateral lacrimal glands: Secondary | ICD-10-CM

## 2024-01-08 MED ORDER — FARICIMAB-SVOA 6 MG/0.05ML IZ SOLN
6.0000 mg | INTRAVITREAL | Status: AC | PRN
Start: 2024-01-08 — End: 2024-01-08
  Administered 2024-01-08: 6 mg via INTRAVITREAL

## 2024-02-07 NOTE — Progress Notes (Signed)
 [facility] - Clinic Note  02/12/2024     CHIEF COMPLAINT Patient presents for Retina Follow Up   HISTORY OF PRESENT ILLNESS: Monica Hamilton is a 83 y.o. female who presents to the clinic today for:   HPI     Retina Follow Up   Patient presents with  Diabetic Retinopathy.  In both eyes.  This started 5 weeks ago.  Duration of 5 weeks.  Since onset it is stable.  I, the attending physician,  performed the HPI with the patient and updated documentation appropriately.        Comments   5 week retina follow up NPDR OU and IVV OD sample pt is reporting no vision changes noticed she denies any flashes some floaters at times her last reading 100 range last week       Last edited by Rennis Chris, MD on 02/12/2024  3:56 PM.      Referring physician: Self-referral -- wife of Jesusita Oka  HISTORICAL INFORMATION:  Selected notes from the MEDICAL RECORD NUMBER Self referral Ocular Hx: pseudophakia OU [name] 2019) PMH: DM (on metformin), HTN, arthritis,    CURRENT MEDICATIONS: Current Outpatient Medications (Ophthalmic Drugs)  Medication Sig   dorzolamide-timolol (COSOPT) 2-0.5 % ophthalmic solution Place 1 drop into both eyes 2 (two) times daily.   No current facility-administered medications for this visit. (Ophthalmic Drugs)   Current Outpatient Medications (Other)  Medication Sig   amLODipine (NORVASC) 5 MG tablet Take 5 mg by mouth daily.   aspirin 81 MG EC tablet Take 81 mg by mouth daily.    CINNAMON PO Take 1,000 mg by mouth daily.   gabapentin (NEURONTIN) 100 MG capsule gabapentin 100 mg capsule  TAKE 1 CAPSULE BY MOUTH ONCE DAILY AT BEDTIME FOR 30 DAYS   gabapentin (NEURONTIN) 300 MG capsule Take 600 mg by mouth at bedtime.   HYDROcodone-acetaminophen (NORCO) 7.5-325 MG tablet Take 1 tablet by mouth every 6 (six) hours as needed for severe pain ((score 7 to 10)).   metFORMIN (GLUCOPHAGE) 1000 MG tablet Take 1,000 mg by mouth 2 (two) times  daily.    methocarbamol (ROBAXIN) 500 MG tablet Take 1 tablet (500 mg total) by mouth every 6 (six) hours as needed for muscle spasms.   metoprolol succinate (TOPROL-XL) 50 MG 24 hr tablet Take 50 mg by mouth daily. Take with or immediately following a meal.   MODERNA COVID-19 VACCINE 100 MCG/0.5ML injection    Multiple Vitamins-Minerals (ICAPS AREDS 2) CAPS Take 1 capsule by mouth 2 (two) times daily.    rosuvastatin (CRESTOR) 20 MG tablet Take by mouth.   valsartan-hydrochlorothiazide (DIOVAN-HCT) 160-25 MG tablet Take 1 tablet by mouth daily.   No current facility-administered medications for this visit. (Other)   REVIEW OF SYSTEMS: ROS   Positive for: Musculoskeletal, Endocrine, Cardiovascular, Eyes Negative for: Constitutional, Gastrointestinal, Neurological, Skin, Genitourinary, HENT, Respiratory, Psychiatric, Allergic/Imm, Heme/Lymph Last edited by Etheleen Mayhew, COT on 02/12/2024 12:58 PM.     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   Past Surgical History:  Procedure Laterality Date   ABDOMINAL HYSTERECTOMY     APPENDECTOMY     BLADDER SUSPENSION     BREAST BIOPSY Left    BREAST EXCISIONAL BIOPSY Left    CATARACT EXTRACTION     CHOLECYSTECTOMY     DILATION AND CURETTAGE OF UTERUS     EYE SURGERY     HEAD & NECK SKIN LESION EXCISIONAL BIOPSY     top left scalp   LUMBAR LAMINECTOMY/DECOMPRESSION MICRODISCECTOMY N/A 08/21/2019   Procedure: Lumbar  decompression L3-4 right, microdiscectomy L3-4 right;  Surgeon: Ranee Gosselin, MD;  Location: [facility];  Service: Orthopedics;  Laterality: N/A;    SHOULDER ARTHROSCOPY WITH SUBACROMIAL DECOMPRESSION Right 01/30/2013   Procedure: SHOULDER ARTHROSCOPY WITH SUBACROMIAL DECOMPRESSION;  Surgeon: Drucilla Schmidt, MD;  Location: [facility];  Service: Orthopedics;  Laterality: Right;  with labral debridement   FAMILY HISTORY Family History  Problem Relation Age of Onset   Heart disease Father    Breast cancer Maternal Aunt    Diabetes Brother    Leukemia Sister    Colon cancer Neg Hx    Esophageal cancer Neg Hx    Rectal cancer Neg Hx    Stomach cancer Neg Hx    SOCIAL HISTORY Social History   Tobacco Use   Smoking status: Never   Smokeless tobacco: Never  Vaping Use   Vaping status: Never Used  Substance Use Topics   Alcohol use: No   Drug use: No       OPHTHALMIC EXAM: Base Eye Exam     Visual Acuity (Snellen - Linear)       Right Left   Dist [place] 20/50 20/200   Dist ph [place] 20/40 NI         Tonometry (Tonopen, 1:05 PM)       Right Left   Pressure 18 20         Pupils       Pupils Dark Light Shape React APD   Right PERRL 3 2 Round Brisk None   Left PERRL 3 2 Round Brisk None         Visual Fields       Left Right    Full Full         Extraocular Movement       Right Left    Full, Ortho Full, Ortho         Neuro/Psych     Oriented x3: Yes   Mood/Affect: Normal         Dilation     Both eyes: 2.5% Phenylephrine @ 1:05 PM           Slit Lamp and Fundus Exam     Slit Lamp Exam       Right Left   Lids/Lashes Dermatochalasis - upper lid Dermatochalasis - upper lid, Meibomian gland dysfunction   Conjunctiva/Sclera nasal and temporal pinguecula Nasal Pinguecula   Cornea tear film debris, 3+ PEE, mild arcus, well healed cataract wound 3+ Punctate epithelial erosions, Arcus, Temporal Well healed cataract wounds, trace tear film  debris, decreased TBUT   Anterior Chamber Deep and quiet Deep and quiet   Iris Poorly dilated Round and dilated   Lens PC IOL in good position, Open posterior capsule PC IOL in good position, 1-2+Posterior capsular opacification  Anterior Vitreous Vitreous syneresis, Posterior vitreous detachment, vitreous condensations Vitreous syneresis, Posterior vitreous detachment         Fundus Exam       Right Left   Disc Pink and Sharp Pink and Sharp, mild temporal Peripapillary atrophy, mild PPP   C/D Ratio 0.5 0.4   Macula Flat, Blunted foveal reflex, RPE mottling and clumping, mild atrophy, Drusen, rare MA, focal IRH and edema / cystic changes temporal mac -- slightly improved Blunted foveal reflex, +CNV with pigment ring -- no surrounding edema -- stably improved, rare MA, +drusen, RPE mottling   Vessels attenuated, Tortuous attenuated, Tortuous   Periphery Attached, scattered mild Reticular degeneration, no heme Attached, scattered Reticular degeneration, No heme.           IMAGING AND PROCEDURES  Imaging and Procedures for @TODAY @  OCT, Retina - OU - Both Eyes       Right Eye Quality was good. Central Foveal Thickness: 267. Progression has improved. Findings include normal foveal contour, no SRF, myopic contour, retinal drusen , intraretinal fluid, outer retinal atrophy (persistent IRF/cystic changes IT macula and fovea -- slightly improved, Patchy ORA -- stable).   Left Eye Quality was good. Central Foveal Thickness: 265. Progression has been stable. Findings include no IRF, no SRF, abnormal foveal contour, retinal drusen , subretinal hyper-reflective material, intraretinal hyper-reflective material, pigment epithelial detachment, outer retinal atrophy (Persistent focal PED/CNV with stable improvement in overlying SRHM and retinal edema).   Notes *Images captured and stored on drive  Diagnosis / Impression: Mild DME OU OD: persistent IRF/cystic changes IT macula and fovea --  slightly improved, Patchy ORA -- stable OS: Exudative ARMD - Persistent focal PED/CNV with stable improvement in overlying SRHM and retinal edema -- nasal macula  Clinical management:  See below  Abbreviations: NFP - Normal foveal profile. CME - cystoid macular edema. PED - pigment epithelial detachment. IRF - intraretinal fluid. SRF - subretinal fluid. EZ - ellipsoid zone. ERM - epiretinal membrane. ORA - outer retinal atrophy. ORT - outer retinal tubulation. SRHM - subretinal hyper-reflective material       Intravitreal Injection, Pharmacologic Agent - OD - Right Eye       Time Out 02/12/2024. 1:38 PM. Confirmed correct patient, procedure, site, and patient consented.   Anesthesia Topical anesthesia was used. Anesthetic medications included Lidocaine 2%, Proparacaine 0.5%.   Procedure Preparation included 5% betadine to ocular surface, eyelid speculum. A (32g) needle was used.   Injection: 6 mg faricimab-svoa 6 MG/0.05ML (Patient supplied)   Route: Intravitreal, Site: Right Eye   NDC: 38182-993-71, Lot: [id number], Expiration date: 07/20/2025, Waste: 0 mL   Post-op Post injection exam found visual acuity of at least counting fingers. The patient tolerated the procedure well. There were no complications. The patient received written and verbal post procedure care education. Post injection medications were not given.   Notes **SAMPLE MEDICATION ADMINISTERED**           ASSESSMENT/PLAN:   ICD-10-CM   1. Exudative age-related macular degeneration of left eye with active choroidal neovascularization (HCC)  H35.3221 OCT, Retina - OU - Both Eyes    2. Intermediate stage nonexudative age-related macular degeneration of right eye  H35.3112     3. Both eyes affected by mild nonproliferative diabetic retinopathy with macular edema, associated with type 2 diabetes mellitus (HCC)  E11.3213 OCT, Retina - OU - Both Eyes    Intravitreal Injection, Pharmacologic Agent - OD - Right Eye     faricimab-svoa (VABYSMO) 6mg /0.76mL intravitreal  injection    4. Long term (current) use of oral hypoglycemic drugs  Z79.84     5. Amblyopia of left eye  H53.002     6. Posterior vitreous detachment of left eye  H43.812     7. Pseudophakia of both eyes  Z96.1     8. Bilateral posterior capsular opacification  H26.493     9. 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, OS   - interval conversion to exudative ARMD noted on 7.26.22 - s/p IVA OS #1 (07.26.22), #2 (08.23.22), #3 (09.21.22), #4 (10.13.22), #5 (11.16.22), #6 (12.14.22), #7 (01.11.23), #8 (02.08.23), #9 (03.22.23), #10 (06.07.23), #11 (08.02.23), #12 (10.25.23), #13 (12.29.23)  - BCVA 20/200 OS (stable and at amblyopic baseline) - OCT shows OS: Persistent focal PED/CNV with stable improvement in overlying SRHM and retinal edema -- nasal macula - will hold off on injection OS again today - pt in agreement - f/u in 5-6 wks, DFE/OCT  2. Intermediate Age related macular degeneration, non-exudative, OD -- stable - exam/OCT today shows OD: persistent IRF/cystic changes IT macula and fovea -- slightly improved; Patchy ORA - stable  - suspect cystic changes mostly related to DM and HTN  - BCVA OD 20/40 from 20/30  - FA 01.04.21 - no CNVM OU -- no exudative disease  - continue amsler grid monitoring  3,4. Mild non-proliferative diabetic retinopathy, both eyes  - A1c: 6.2 on 10.09.24 - s/p IVA OD #1 (01.29.24), #2 (02.26.24), #3 (03.25.24), #4 (04.22.24), #5 (05.28.24) -- IVA resistance - s/p IVE OD #1 (07.09.24), #2 (08.12.24), #3 (09.09.24), #4 (10.07.24), #5 (11.04.24) -- IVE resistance - s/p IVV OD #1 (SAMPLE -- 01.13.25), #2 (SAMPLE -- 02.17.25) - exam shows rare MA/IRH - FA 01.04.21 - late leaking, perifoveal MA OU; no NV OU - OCT OD shows persistent IRF/cystic changes IT macula and fovea -- slightly improved, Patchy ORA -- stable at 5 wks - BCVA OD  improved to 20/40 from 20/50  - recommend IVV OD #3 (sample) today, 03.24.25 with follow up extended to 5-6 weeks  - Pt wishes to proceed w/ injection - RBA of procedure discussed, questions answered - IVV informed consent obtained and signed 01.13.25 - see procedure note  - f/u 5-6 weeks DFE, OCT, likely injection OD - will check [employer] auth and assistance programs  5. Severe amblyopia OS  - history of patching as a child -- no strabismus surgery - VA dropped down to 20/300 from 20/250 (baseline) due to conv to exu ARMD on 7.26.22  6. PVD / vitreous syneresis OS  - Discussed findings and prognosis  - No RT or RD on 360 scleral depressed exam  - Reviewed s/s of RT/RD  - strict return precautions for any such RT/RD symptoms  7. Pseudophakia OU  - s/p CE/IOL OU by [name]  - beautiful surgeries, doing well  - continue to monitor  8. PCO OU  - pt reports some fogginess of vision - s/p YAG Cap OD (01.15.24) -- good opening -- BCVA initially improved to 20/30 from 20/40 - recommend yag cap OS at some point  9. Dry eyes OU  - persistent PEE on slit lamp exam.   - continue artificial tears and lubricating ointment as needed---patient not using  10. Ocular Hypertension OU  - IOP today 18,20  - continue cosopt bid OU  Ophthalmic Meds Ordered this visit:  Meds ordered this  encounter  Medications   faricimab-svoa (VABYSMO) 6mg /0.18mL intravitreal injection     Return for f/u 5-6 weeks, NPDR OU, DFE, OCT, Possible Injxn.  There are no Patient Instructions on file for this visit.  This document serves as a record of services personally performed by Karie Chimera, MD, PhD. It was created on their behalf by Glee Arvin. Manson Passey, OA an ophthalmic technician. The creation of this record is the provider's dictation and/or activities during the visit.    Electronically signed by: Glee Arvin. Manson Passey, OA 02/12/24 3:59 PM  Karie Chimera, M.D., Ph.D. Diseases & Surgery of the Retina and  Vitreous [facility]  I have reviewed the above documentation for accuracy and completeness, and I agree with the above. Karie Chimera, M.D., Ph.D. 02/12/24 3:59 PM   Abbreviations: M myopia (nearsighted); A astigmatism; H hyperopia (farsighted); P presbyopia; Mrx spectacle prescription;  CTL contact lenses; OD right eye; OS left eye; OU both eyes  XT exotropia; ET esotropia; PEK punctate epithelial keratitis; PEE punctate epithelial erosions; DES dry eye syndrome; MGD meibomian gland dysfunction; ATs artificial tears; PFAT's preservative free artificial tears; NSC nuclear sclerotic cataract; PSC posterior subcapsular cataract; ERM epi-retinal membrane; PVD posterior vitreous detachment; RD retinal detachment; DM diabetes mellitus; DR diabetic retinopathy; NPDR non-proliferative diabetic retinopathy; PDR proliferative diabetic retinopathy; CSME clinically significant macular edema; DME diabetic macular edema; dbh dot blot hemorrhages; CWS cotton wool spot; POAG primary open angle glaucoma; C/D cup-to-disc ratio; HVF humphrey visual field; GVF goldmann visual field; OCT optical coherence tomography; IOP intraocular pressure; BRVO Branch retinal vein occlusion; CRVO central retinal vein occlusion; CRAO central retinal artery occlusion; BRAO branch retinal artery occlusion; RT retinal tear; SB scleral buckle; PPV pars plana vitrectomy; VH Vitreous hemorrhage; PRP panretinal laser photocoagulation; IVK intravitreal kenalog; VMT vitreomacular traction; MH Macular hole;  NVD neovascularization of the disc; NVE neovascularization elsewhere; AREDS age related eye disease study; ARMD age related macular degeneration; POAG primary open angle glaucoma; EBMD epithelial/anterior basement membrane dystrophy; ACIOL anterior chamber intraocular lens; IOL intraocular lens; PCIOL posterior chamber intraocular lens; Phaco/IOL phacoemulsification with intraocular lens placement; PRK photorefractive  keratectomy; LASIK laser assisted in situ keratomileusis; HTN hypertension; DM diabetes mellitus; COPD chronic obstructive pulmonary disease

## 2024-02-12 ENCOUNTER — Encounter (INDEPENDENT_AMBULATORY_CARE_PROVIDER_SITE_OTHER): Payer: Self-pay | Admitting: Ophthalmology

## 2024-02-12 ENCOUNTER — Ambulatory Visit (INDEPENDENT_AMBULATORY_CARE_PROVIDER_SITE_OTHER): Payer: Medicare HMO | Admitting: Ophthalmology

## 2024-02-12 DIAGNOSIS — Z7984 Long term (current) use of oral hypoglycemic drugs: Secondary | ICD-10-CM

## 2024-02-12 DIAGNOSIS — H04123 Dry eye syndrome of bilateral lacrimal glands: Secondary | ICD-10-CM

## 2024-02-12 DIAGNOSIS — H43812 Vitreous degeneration, left eye: Secondary | ICD-10-CM

## 2024-02-12 DIAGNOSIS — H353112 Nonexudative age-related macular degeneration, right eye, intermediate dry stage: Secondary | ICD-10-CM

## 2024-02-12 DIAGNOSIS — Z961 Presence of intraocular lens: Secondary | ICD-10-CM

## 2024-02-12 DIAGNOSIS — H40053 Ocular hypertension, bilateral: Secondary | ICD-10-CM

## 2024-02-12 DIAGNOSIS — E113213 Type 2 diabetes mellitus with mild nonproliferative diabetic retinopathy with macular edema, bilateral: Secondary | ICD-10-CM | POA: Diagnosis not present

## 2024-02-12 DIAGNOSIS — H53002 Unspecified amblyopia, left eye: Secondary | ICD-10-CM

## 2024-02-12 DIAGNOSIS — H353221 Exudative age-related macular degeneration, left eye, with active choroidal neovascularization: Secondary | ICD-10-CM

## 2024-02-12 DIAGNOSIS — H26493 Other secondary cataract, bilateral: Secondary | ICD-10-CM

## 2024-02-12 MED ORDER — FARICIMAB-SVOA 6 MG/0.05ML IZ SOLN
6.0000 mg | INTRAVITREAL | Status: AC | PRN
Start: 2024-02-12 — End: 2024-02-12
  Administered 2024-02-12: 6 mg via INTRAVITREAL

## 2024-03-04 DIAGNOSIS — M40299 Other kyphosis, site unspecified: Secondary | ICD-10-CM | POA: Diagnosis not present

## 2024-03-04 DIAGNOSIS — E785 Hyperlipidemia, unspecified: Secondary | ICD-10-CM | POA: Diagnosis not present

## 2024-03-04 DIAGNOSIS — I700 Atherosclerosis of aorta: Secondary | ICD-10-CM | POA: Diagnosis not present

## 2024-03-04 DIAGNOSIS — I129 Hypertensive chronic kidney disease with stage 1 through stage 4 chronic kidney disease, or unspecified chronic kidney disease: Secondary | ICD-10-CM | POA: Diagnosis not present

## 2024-03-04 DIAGNOSIS — E1129 Type 2 diabetes mellitus with other diabetic kidney complication: Secondary | ICD-10-CM | POA: Diagnosis not present

## 2024-03-04 DIAGNOSIS — R82998 Other abnormal findings in urine: Secondary | ICD-10-CM | POA: Diagnosis not present

## 2024-03-04 DIAGNOSIS — Z0000 Encounter for general adult medical examination without abnormal findings: Secondary | ICD-10-CM | POA: Diagnosis not present

## 2024-03-04 DIAGNOSIS — Z1331 Encounter for screening for depression: Secondary | ICD-10-CM | POA: Diagnosis not present

## 2024-03-04 DIAGNOSIS — D638 Anemia in other chronic diseases classified elsewhere: Secondary | ICD-10-CM | POA: Diagnosis not present

## 2024-03-04 DIAGNOSIS — M5416 Radiculopathy, lumbar region: Secondary | ICD-10-CM | POA: Diagnosis not present

## 2024-03-04 DIAGNOSIS — Z1339 Encounter for screening examination for other mental health and behavioral disorders: Secondary | ICD-10-CM | POA: Diagnosis not present

## 2024-03-04 DIAGNOSIS — H3530 Unspecified macular degeneration: Secondary | ICD-10-CM | POA: Diagnosis not present

## 2024-03-04 DIAGNOSIS — Z23 Encounter for immunization: Secondary | ICD-10-CM | POA: Diagnosis not present

## 2024-03-15 NOTE — Progress Notes (Signed)
 [facility] - Clinic Note  03/25/2024     CHIEF COMPLAINT Patient presents for Retina Follow Up   HISTORY OF PRESENT ILLNESS: Monica Hamilton is a 83 y.o. female who presents to the clinic today for:   HPI     Retina Follow Up   Patient presents with  Diabetic Retinopathy.  In both eyes.  This started years ago.  Duration of 6 weeks.  Since onset it is stable.  I, the attending physician,  performed the HPI with the patient and updated documentation appropriately.        Comments   Pt presents for 9 week retina follow up, NPDR OU and IVV OD. Pt states vision has been about the same. Pt denies FOL and discomfort. Pt has noticed some black spot floaters in the right eye when outside. Pt states she does not know her last A1c but that it was in a good range. Pt does not measure blood sugar. Pt states she is consistent with dorz/tim bid OU and refresh bid OU.      Last edited by Ronelle Coffee, MD on 03/25/2024  6:45 PM.     Referring physician: Self-referral -- wife of Aubrey Leaf  HISTORICAL INFORMATION:  Selected notes from the MEDICAL RECORD NUMBER Self referral Ocular Hx: pseudophakia OU Gennie Kicks 2019) PMH: DM (on metformin), HTN, arthritis,    CURRENT MEDICATIONS: Current Outpatient Medications (Ophthalmic Drugs)  Medication Sig   dorzolamide -timolol  (COSOPT ) 2-0.5 % ophthalmic solution Place 1 drop into both eyes 2 (two) times daily.   No current facility-administered medications for this visit. (Ophthalmic Drugs)   Current Outpatient Medications (Other)  Medication Sig   amLODipine  (NORVASC ) 5 MG tablet Take 5 mg by mouth daily.   aspirin 81 MG EC tablet Take 81 mg by mouth daily.    CINNAMON PO Take 1,000 mg by mouth daily.   gabapentin  (NEURONTIN ) 100 MG capsule gabapentin  100 mg capsule  TAKE 1 CAPSULE BY MOUTH ONCE DAILY AT BEDTIME FOR 30 DAYS   gabapentin  (NEURONTIN ) 300 MG capsule Take 600 mg by mouth at bedtime.   HYDROcodone -acetaminophen   (NORCO) 7.5-325 MG tablet Take 1 tablet by mouth every 6 (six) hours as needed for severe pain ((score 7 to 10)).   metFORMIN (GLUCOPHAGE) 1000 MG tablet Take 1,000 mg by mouth 2 (two) times daily.    methocarbamol  (ROBAXIN ) 500 MG tablet Take 1 tablet (500 mg total) by mouth every 6 (six) hours as needed for muscle spasms.   metoprolol  succinate (TOPROL -XL) 50 MG 24 hr tablet Take 50 mg by mouth daily. Take with or immediately following a meal.   MODERNA COVID-19 VACCINE 100 MCG/0.5ML injection    Multiple Vitamins-Minerals (ICAPS AREDS 2) CAPS Take 1 capsule by mouth 2 (two) times daily.    rosuvastatin (CRESTOR) 20 MG tablet Take by mouth.   valsartan -hydrochlorothiazide  (DIOVAN -HCT) 160-25 MG tablet Take 1 tablet by mouth daily.   No current facility-administered medications for this visit. (Other)   REVIEW OF SYSTEMS: ROS   Positive for: Musculoskeletal, Endocrine, Cardiovascular, Eyes Negative for: Constitutional, Gastrointestinal, Neurological, Skin, Genitourinary, HENT, Respiratory, Psychiatric, Allergic/Imm, Heme/Lymph Last edited by Carrington Clack, COT on 03/25/2024  1:02 PM.      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    Age 38, led to Mitral valve prolapse   Past Surgical History:  Procedure Laterality Date   ABDOMINAL HYSTERECTOMY     APPENDECTOMY     BLADDER SUSPENSION     BREAST BIOPSY Left    BREAST EXCISIONAL BIOPSY Left    CATARACT EXTRACTION     CHOLECYSTECTOMY     DILATION AND CURETTAGE OF  UTERUS     EYE SURGERY     HEAD & NECK SKIN LESION EXCISIONAL BIOPSY     top left scalp   LUMBAR LAMINECTOMY/DECOMPRESSION MICRODISCECTOMY N/A 08/21/2019   Procedure: Lumbar decompression L3-4 right, microdiscectomy L3-4 right;  Surgeon: Hazle Lites, MD;  Location: [facility];  Service: Orthopedics;  Laterality: N/A;    SHOULDER ARTHROSCOPY WITH SUBACROMIAL DECOMPRESSION Right 01/30/2013   Procedure: SHOULDER ARTHROSCOPY WITH SUBACROMIAL DECOMPRESSION;  Surgeon: Verlinda Gloss, MD;  Location: [facility];  Service: Orthopedics;  Laterality: Right;  with labral debridement   FAMILY HISTORY Family History  Problem Relation Age of Onset   Heart disease Father    Breast cancer Maternal Aunt    Diabetes Brother    Leukemia Sister    Colon cancer Neg Hx    Esophageal cancer Neg Hx    Rectal cancer Neg Hx    Stomach cancer Neg Hx    SOCIAL HISTORY Social History   Tobacco Use   Smoking status: Never   Smokeless tobacco: Never  Vaping Use   Vaping status: Never Used  Substance Use Topics   Alcohol  use: No   Drug use: No       OPHTHALMIC EXAM: Base Eye Exam     Visual Acuity (Snellen - Linear)       Right Left   Dist [place] 20/30 +1 20/150 (can only see last letter on each line)   Dist ph  NI NI         Tonometry (Tonopen, 12:59 PM)       Right Left   Pressure 22 22         Pupils       Pupils Dark Light Shape React APD   Right PERRL 3 2 Round Minimal None   Left PERRL 3 2 Round Minimal None         Visual Fields       Left Right    Full Full         Extraocular Movement       Right Left    Full, Ortho Full, Ortho         Neuro/Psych     Oriented x3: Yes   Mood/Affect: Normal         Dilation     Both eyes: 1.0% Mydriacyl, 2.5% Phenylephrine  @ 1:01 PM           Slit Lamp and Fundus Exam     Slit Lamp Exam       Right Left   Lids/Lashes Dermatochalasis - upper lid Dermatochalasis - upper lid, Meibomian gland dysfunction    Conjunctiva/Sclera nasal and temporal pinguecula Nasal Pinguecula   Cornea tear film debris, 3+ PEE, mild arcus, well healed cataract wound 3+ Punctate  epithelial erosions, Arcus, Temporal Well healed cataract wounds, trace tear film debris, decreased TBUT   Anterior Chamber Deep and quiet Deep and quiet   Iris Poorly dilated Round and dilated   Lens PC IOL in good position, Open posterior capsule PC IOL in good position, 1-2+Posterior capsular opacification   Anterior Vitreous Vitreous syneresis, Posterior vitreous detachment, vitreous condensations Vitreous syneresis, Posterior vitreous detachment         Fundus Exam       Right Left   Disc Pink and Sharp Pink and Sharp, mild temporal Peripapillary atrophy, mild PPP   C/D Ratio 0.5 0.4   Macula Flat, Blunted foveal reflex, RPE mottling and clumping, mild atrophy, Drusen, rare MA, focal IRH and edema / cystic changes temporal mac -- slightly improved Blunted foveal reflex, +CNV with pigment ring -- no surrounding edema -- stably improved, rare MA, +drusen, RPE mottling   Vessels attenuated, mild tortuosity attenuated, Tortuous   Periphery Attached, scattered mild Reticular degeneration, no heme Attached, scattered Reticular degeneration, No heme.           IMAGING AND PROCEDURES  Imaging and Procedures for @TODAY @  OCT, Retina - OU - Both Eyes       Right Eye Quality was good. Central Foveal Thickness: 269. Progression has improved. Findings include normal foveal contour, no SRF, myopic contour, retinal drusen , intraretinal fluid, outer retinal atrophy (persistent IRF/cystic changes IT macula and fovea -- slightly improved, Patchy ORA -- stable).   Left Eye Quality was good. Central Foveal Thickness: 262. Progression has been stable. Findings include no IRF, no SRF, abnormal foveal contour, retinal drusen , subretinal hyper-reflective material, intraretinal hyper-reflective material, pigment epithelial detachment, outer retinal  atrophy (Persistent focal PED/CNV with stable improvement in overlying SRHM and retinal edema, scattered cystic changes).   Notes *Images captured and stored on drive  Diagnosis / Impression: Mild DME OU OD: persistent IRF/cystic changes IT macula and fovea -- slightly improved, Patchy ORA -- stable OS: Exudative ARMD - Persistent focal PED/CNV with stable improvement in overlying SRHM and retinal edema, scattered cystic changes  Clinical management:  See below  Abbreviations: NFP - Normal foveal profile. CME - cystoid macular edema. PED - pigment epithelial detachment. IRF - intraretinal fluid. SRF - subretinal fluid. EZ - ellipsoid zone. ERM - epiretinal membrane. ORA - outer retinal atrophy. ORT - outer retinal tubulation. SRHM - subretinal hyper-reflective material       Intravitreal Injection, Pharmacologic Agent - OD - Right Eye       Time Out 03/25/2024. 1:25 PM. Confirmed correct patient, procedure, site, and patient consented.   Anesthesia Topical anesthesia was used. Anesthetic medications included Lidocaine  2%, Proparacaine 0.5%.   Procedure Preparation included 5% betadine  to ocular surface, eyelid speculum. A (32g) needle was used.   Injection: 6 mg faricimab -svoa 6 MG/0.05ML (Patient supplied)   Route: Intravitreal, Site: Right Eye   NDC: 14782-956-21, Lot: [id number], Expiration date: 08/20/2025, Waste: 0 mL   Post-op Post injection exam found visual acuity of at least counting fingers. The patient tolerated the procedure well. There were no complications. The patient received written and verbal post procedure care education. Post injection medications were not given.   Notes **SAMPLE MEDICATION ADMINISTERED**           ASSESSMENT/PLAN:   ICD-10-CM   1. Exudative age-related macular degeneration of left eye with active choroidal neovascularization (HCC)  H35.3221 OCT, Retina - OU - Both Eyes    Intravitreal Injection, Pharmacologic Agent - OD -  Right Eye     faricimab -svoa (VABYSMO ) 6mg /0.29mL intravitreal injection    2. Intermediate stage nonexudative age-related macular degeneration of right eye  H35.3112     3. Both eyes affected by mild nonproliferative diabetic retinopathy with macular edema, associated with type 2 diabetes mellitus (HCC)  E11.3213 OCT, Retina - OU - Both Eyes    4. Long term (current) use of oral hypoglycemic drugs  Z79.84     5. Amblyopia of left eye  H53.002     6. Posterior vitreous detachment of left eye  H43.812     7. Pseudophakia of both eyes  Z96.1     8. Bilateral posterior capsular opacification  H26.493     9. 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, OS   - interval conversion to exudative ARMD noted on 7.26.22 - s/p IVA OS #1 (07.26.22), #2 (08.23.22), #3 (09.21.22), #4 (10.13.22), #5 (11.16.22), #6 (12.14.22), #7 (01.11.23), #8 (02.08.23), #9 (03.22.23), #10 (06.07.23), #11 (08.02.23), #12 (10.25.23), #13 (12.29.23)  - BCVA OS 20/150 from 20/200 (stable and at amblyopic baseline) - OCT shows OS: Exudative ARMD - Persistent focal PED/CNV with stable improvement in overlying SRHM and retinal edema, scattered cystic changes - will hold off on injection OS again today - pt in agreement - f/u in 7 wks, DFE, OCT   2. Intermediate Age related macular degeneration, non-exudative, OD -- stable - exam/OCT today shows OD: persistent IRF/cystic changes IT macula and fovea -- slightly improved; Patchy ORA - stable  - suspect cystic changes mostly related to DM and HTN  - BCVA OD 20/30 from 20/40  - FA 01.04.21 - no CNVM OU -- no exudative disease  - continue amsler grid monitoring   3,4. Mild non-proliferative diabetic retinopathy, both eyes  - A1c: 6.2 on 10.09.24 - s/p IVA OD #1 (01.29.24), #2 (02.26.24), #3 (03.25.24), #4 (04.22.24), #5 (05.28.24) -- IVA resistance - s/p IVE OD #1 (07.09.24), #2 (08.12.24), #3 (09.09.24), #4  (10.07.24), #5 (11.04.24) -- IVE resistance - s/p IVV OD #1 (SAMPLE -- 01.13.25), #2 (SAMPLE -- 02.17.25),  #3 (SAMPLE -- 03.24.25) - exam shows rare MA/IRH - FA 01.04.21 - late leaking, perifoveal MA OU; no NV OU - OCT OD shows OD: persistent IRF/cystic changes IT macula and fovea -- slightly improved, Patchy ORA -- stable; OS: Exudative ARMD - Persistent focal PED/CNV with stable improvement in overlying SRHM and retinal edema, scattered cystic changes at 6 wks - BCVA OD improved to 20/30 from 20/40  - recommend IVV OD #4 (sample) today, 05.05.25 with follow up extended to 7 weeks  - Pt wishes to proceed w/ injection - RBA of procedure discussed, questions answered - IVV informed consent obtained and signed 01.13.25 - see procedure note  - f/u 7 weeks DFE, OCT, likely injection OD - Vabysmo  assistance program tbd  5. Severe amblyopia OS  - history of patching as a child -- no strabismus surgery  - VA dropped down to 20/300 from 20/250 (baseline) due to conv to exu ARMD on 07.26.22  6. PVD / vitreous syneresis OS  - Discussed findings and prognosis  - No RT or RD on 360 scleral depressed exam  - Reviewed s/s of RT/RD  - strict return precaution sfor any such RT/RD symptoms  7. Pseudophakia OU  - s/p CE/IOL OU by Gennie Kicks  - beautiful surgeries, doing well  - continue to monitor  8. PCO OU  - pt reports some fogginess of vision - s/p YAG Cap OD (01.15.24) -- good opening -- BCVA initially improved to 20/30 from 20/40 - recommend YAG capsulotomy OS  9. Dry eyes OU  - persistent PEE on slit lamp exam.   - continue artificial tears and lubricating ointment PRN ---patient not using  10. Ocular Hypertension OU  - IOP today 22 OU  - continue Cosopt  bid OU  Ophthalmic Meds Ordered this visit:  Meds ordered this encounter  Medications   faricimab -svoa (VABYSMO ) 6mg /0.85mL intravitreal injection     Return in about 7 weeks (around 05/13/2024) for +DME OD, Dilated Exam, OCT,  Possible Injxn.  There are no Patient Instructions on file for this visit.  This document serves as a record of services personally performed by Jeanice Millard, MD, PhD. It was created on their behalf by Morley Arabia. Bevin Bucks, OA an ophthalmic technician. The creation of this record is the provider's dictation and/or activities during the visit.    Electronically signed by: Morley Arabia. Bevin Bucks, OA 03/25/24 6:48 PM  Jeanice Millard, M.D., Ph.D. Diseases & Surgery of the Retina and Vitreous [facility]  I have reviewed the above documentation for accuracy and completeness, and I agree with the above. Jeanice Millard, M.D., Ph.D. 03/25/24 6:49 PM   Abbreviations: M myopia (nearsighted); A astigmatism; H hyperopia (farsighted); P presbyopia; Mrx spectacle prescription;  CTL contact lenses; OD right eye; OS left eye; OU both eyes  XT exotropia; ET esotropia; PEK punctate epithelial keratitis; PEE punctate epithelial erosions; DES dry eye syndrome; MGD meibomian gland dysfunction; ATs artificial tears; PFAT's preservative free artificial tears; NSC nuclear sclerotic cataract; PSC posterior subcapsular cataract; ERM epi-retinal membrane; PVD posterior vitreous detachment; RD retinal detachment; DM diabetes mellitus; DR diabetic retinopathy; NPDR non-proliferative diabetic retinopathy; PDR proliferative diabetic retinopathy; CSME clinically significant macular edema; DME diabetic macular edema; dbh dot blot hemorrhages; CWS cotton wool spot; POAG primary open angle glaucoma; C/D cup-to-disc ratio; HVF humphrey visual field; GVF goldmann visual field; OCT optical coherence tomography; IOP intraocular pressure; BRVO Branch retinal vein occlusion; CRVO central retinal vein occlusion; CRAO central retinal artery occlusion; BRAO branch retinal artery occlusion; RT retinal tear; SB scleral buckle; PPV pars plana vitrectomy; VH Vitreous hemorrhage; PRP panretinal laser photocoagulation; IVK  intravitreal kenalog; VMT vitreomacular traction; MH Macular hole;  NVD neovascularization of the disc; NVE neovascularization elsewhere; AREDS age related eye disease study; ARMD age related macular degeneration; POAG primary open angle glaucoma; EBMD epithelial/anterior basement membrane dystrophy; ACIOL anterior chamber intraocular lens; IOL intraocular lens; PCIOL posterior chamber intraocular lens; Phaco/IOL phacoemulsification with intraocular lens placement; PRK photorefractive keratectomy; LASIK laser assisted in situ keratomileusis; HTN hypertension; DM diabetes mellitus; COPD chronic obstructive pulmonary disease

## 2024-03-25 ENCOUNTER — Encounter (INDEPENDENT_AMBULATORY_CARE_PROVIDER_SITE_OTHER): Payer: Self-pay | Admitting: Ophthalmology

## 2024-03-25 ENCOUNTER — Ambulatory Visit (INDEPENDENT_AMBULATORY_CARE_PROVIDER_SITE_OTHER): Admitting: Ophthalmology

## 2024-03-25 DIAGNOSIS — H26493 Other secondary cataract, bilateral: Secondary | ICD-10-CM | POA: Diagnosis not present

## 2024-03-25 DIAGNOSIS — H04123 Dry eye syndrome of bilateral lacrimal glands: Secondary | ICD-10-CM

## 2024-03-25 DIAGNOSIS — H353221 Exudative age-related macular degeneration, left eye, with active choroidal neovascularization: Secondary | ICD-10-CM | POA: Diagnosis not present

## 2024-03-25 DIAGNOSIS — H53002 Unspecified amblyopia, left eye: Secondary | ICD-10-CM

## 2024-03-25 DIAGNOSIS — Z961 Presence of intraocular lens: Secondary | ICD-10-CM | POA: Diagnosis not present

## 2024-03-25 DIAGNOSIS — Z7984 Long term (current) use of oral hypoglycemic drugs: Secondary | ICD-10-CM | POA: Diagnosis not present

## 2024-03-25 DIAGNOSIS — H40053 Ocular hypertension, bilateral: Secondary | ICD-10-CM | POA: Diagnosis not present

## 2024-03-25 DIAGNOSIS — H43812 Vitreous degeneration, left eye: Secondary | ICD-10-CM | POA: Diagnosis not present

## 2024-03-25 DIAGNOSIS — H353112 Nonexudative age-related macular degeneration, right eye, intermediate dry stage: Secondary | ICD-10-CM

## 2024-03-25 DIAGNOSIS — E113213 Type 2 diabetes mellitus with mild nonproliferative diabetic retinopathy with macular edema, bilateral: Secondary | ICD-10-CM

## 2024-03-25 MED ORDER — FARICIMAB-SVOA 6 MG/0.05ML IZ SOLN
6.0000 mg | INTRAVITREAL | Status: AC | PRN
Start: 2024-03-25 — End: 2024-03-25
  Administered 2024-03-25: 6 mg via INTRAVITREAL

## 2024-04-04 DIAGNOSIS — M79601 Pain in right arm: Secondary | ICD-10-CM | POA: Diagnosis not present

## 2024-04-04 DIAGNOSIS — R202 Paresthesia of skin: Secondary | ICD-10-CM | POA: Diagnosis not present

## 2024-04-04 DIAGNOSIS — E1129 Type 2 diabetes mellitus with other diabetic kidney complication: Secondary | ICD-10-CM | POA: Diagnosis not present

## 2024-04-04 DIAGNOSIS — M25511 Pain in right shoulder: Secondary | ICD-10-CM | POA: Diagnosis not present

## 2024-04-04 DIAGNOSIS — I129 Hypertensive chronic kidney disease with stage 1 through stage 4 chronic kidney disease, or unspecified chronic kidney disease: Secondary | ICD-10-CM | POA: Diagnosis not present

## 2024-04-16 DIAGNOSIS — M25511 Pain in right shoulder: Secondary | ICD-10-CM | POA: Diagnosis not present

## 2024-04-30 NOTE — Progress Notes (Shared)
 [facility] - Clinic Note  05/13/2024     CHIEF COMPLAINT Patient presents for No chief complaint on file.   HISTORY OF PRESENT ILLNESS: Monica Hamilton is a 83 y.o. female who presents to the clinic today for:     Referring physician: Self-referral -- wife of Monica Hamilton  HISTORICAL INFORMATION:  Selected notes from the MEDICAL RECORD NUMBER Self referral Ocular Hx: pseudophakia OU Monica Hamilton 2019) PMH: DM (on metformin), HTN, arthritis,    CURRENT MEDICATIONS: Current Outpatient Medications (Ophthalmic Drugs)  Medication Sig   dorzolamide -timolol  (COSOPT ) 2-0.5 % ophthalmic solution Place 1 drop into both eyes 2 (two) times daily.   No current facility-administered medications for this visit. (Ophthalmic Drugs)   Current Outpatient Medications (Other)  Medication Sig   amLODipine  (NORVASC ) 5 MG tablet Take 5 mg by mouth daily.   aspirin 81 MG EC tablet Take 81 mg by mouth daily.    CINNAMON PO Take 1,000 mg by mouth daily.   gabapentin  (NEURONTIN ) 100 MG capsule gabapentin  100 mg capsule  TAKE 1 CAPSULE BY MOUTH ONCE DAILY AT BEDTIME FOR 30 DAYS   gabapentin  (NEURONTIN ) 300 MG capsule Take 600 mg by mouth at bedtime.   HYDROcodone -acetaminophen  (NORCO) 7.5-325 MG tablet Take 1 tablet by mouth every 6 (six) hours as needed for severe pain ((score 7 to 10)).   metFORMIN (GLUCOPHAGE) 1000 MG tablet Take 1,000 mg by mouth 2 (two) times daily.    methocarbamol  (ROBAXIN ) 500 MG tablet Take 1 tablet (500 mg total) by mouth every 6 (six) hours as needed for muscle spasms.   metoprolol  succinate (TOPROL -XL) 50 MG 24 hr tablet Take 50 mg by mouth daily. Take with or immediately following a meal.   MODERNA COVID-19 VACCINE 100 MCG/0.5ML injection    Multiple Vitamins-Minerals (ICAPS AREDS 2) CAPS Take 1 capsule by mouth 2 (two) times daily.    rosuvastatin (CRESTOR) 20 MG tablet Take by mouth.   valsartan -hydrochlorothiazide  (DIOVAN -HCT) 160-25 MG tablet Take 1  tablet by mouth daily.   No current facility-administered medications for this visit. 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69, led to Mitral valve prolapse   Past Surgical History:  Procedure Laterality Date   ABDOMINAL HYSTERECTOMY     APPENDECTOMY     BLADDER SUSPENSION     BREAST BIOPSY Left    BREAST EXCISIONAL BIOPSY Left    CATARACT EXTRACTION     CHOLECYSTECTOMY     DILATION AND CURETTAGE OF UTERUS     EYE SURGERY     HEAD & NECK SKIN LESION EXCISIONAL BIOPSY     top left scalp   LUMBAR LAMINECTOMY/DECOMPRESSION MICRODISCECTOMY N/A 08/21/2019   Procedure: Lumbar decompression L3-4 right, microdiscectomy L3-4 right;  Surgeon: Hazle Lites, MD;  Location: [facility];  Service: Orthopedics;  Laterality: N/A;    SHOULDER ARTHROSCOPY  WITH SUBACROMIAL DECOMPRESSION Right 01/30/2013   Procedure: SHOULDER ARTHROSCOPY WITH SUBACROMIAL DECOMPRESSION;  Surgeon: Verlinda Gloss, MD;  Location: [facility];  Service: Orthopedics;  Laterality: Right;  with labral debridement   FAMILY HISTORY Family History  Problem Relation Age of Onset   Heart disease Father    Breast cancer Maternal Aunt    Diabetes Brother    Leukemia Sister    Colon cancer Neg Hx    Esophageal cancer Neg Hx    Rectal cancer Neg Hx    Stomach cancer Neg Hx    SOCIAL HISTORY Social History   Tobacco Use   Smoking status: Never   Smokeless tobacco: Never  Vaping Use   Vaping status: Never  Used  Substance Use Topics   Alcohol  use: No   Drug use: No       OPHTHALMIC EXAM: Not recorded    IMAGING AND PROCEDURES  Imaging and Procedures for @TODAY @          ASSESSMENT/PLAN:   ICD-10-CM   1. Exudative age-related macular degeneration of left eye with active choroidal neovascularization (HCC)  H35.3221     2. Intermediate stage nonexudative age-related macular degeneration of right eye  H35.3112     3. Both eyes affected by mild nonproliferative diabetic retinopathy with macular edema, associated with type 2 diabetes mellitus (HCC)  W11.9147     4. Long term (current) use of oral hypoglycemic drugs  Z79.84     5. Amblyopia of left eye  H53.002     6. Posterior vitreous detachment of left eye  H43.812     7. Pseudophakia of both eyes  Z96.1     8. Bilateral posterior capsular opacification  H26.493     9. Dry eyes  H04.123     10. 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, OS   - interval conversion to exudative ARMD noted on 7.26.22 - s/p IVA OS #1 (07.26.22), #2 (08.23.22), #3 (09.21.22), #4 (10.13.22), #5 (11.16.22), #6 (12.14.22), #7 (01.11.23), #8 (02.08.23), #9 (03.22.23), #10 (06.07.23), #11 (08.02.23), #12 (10.25.23), #13 (12.29.23)  - BCVA OS 20/150 from 20/200 (stable and at amblyopic baseline) - OCT shows OS: Exudative ARMD - Persistent focal PED/CNV with stable improvement in overlying SRHM and retinal edema, scattered cystic changes - will hold off on injection OS again today - pt in agreement - f/u in 7 wks, DFE, OCT   2. Intermediate Age related macular degeneration, non-exudative, OD -- stable - exam/OCT today shows OD: persistent IRF/cystic changes IT macula and fovea -- slightly improved; Patchy ORA - stable  - suspect cystic changes mostly related to DM and HTN  - BCVA OD 20/30 from 20/40  - FA 01.04.21 - no CNVM OU -- no exudative disease  - continue amsler grid monitoring    3,4.  Mild non-proliferative diabetic retinopathy, both eyes  - A1c: 6.2 on 10.09.24 - s/p IVA OD #1 (01.29.24), #2 (02.26.24), #3 (03.25.24), #4 (04.22.24), #5 (05.28.24) -- IVA resistance - s/p IVE OD #1 (07.09.24), #2 (08.12.24), #3 (09.09.24), #4 (10.07.24), #5 (11.04.24) -- IVE resistance - s/p IVV OD #1 (SAMPLE -- 01.13.25), #2 (SAMPLE -- 02.17.25),  #3 (SAMPLE -- 03.24.25), #4 (sample -- 05.05.25) - exam shows rare MA/IRH - FA 01.04.21 - late leaking, perifoveal MA OU; no NV OU - OCT OD shows OD: persistent IRF/cystic changes IT macula and fovea -- slightly improved, Patchy ORA -- stable; OS: Exudative ARMD -  Persistent focal PED/CNV with stable improvement in overlying SRHM and retinal edema, scattered cystic changes at 6 wks - BCVA OD improved to 20/30 from 20/40  - recommend IVV OD #5 (sample) today, 06.23.25 with follow up extended to 7 weeks  - Pt wishes to proceed w/ injection - RBA of procedure discussed, questions answered - IVV informed consent obtained and signed 01.13.25 - see procedure note  - f/u 7 weeks DFE, OCT, likely injection OD - Vabysmo  assistance program tbd  5. Severe amblyopia OS  - history of patching as a child -- no strabismus surgery  - VA dropped down to 20/300 from 20/250 (baseline) due to conv to exu ARMD on 07.26.22   6. PVD / vitreous syneresis OS  - Discussed findings and prognosis  - No RT or RD on 360 scleral depressed exam  - Reviewed s/s of RT/RD  - strict return precaution sfor any such RT/RD symptoms   7. Pseudophakia OU  - s/p CE/IOL OU by Monica Hamilton  - beautiful surgeries, doing well - continue to monitor    8. PCO OU  - pt reports some fogginess of vision - s/p YAG Cap OD (01.15.24) -- good opening -- BCVA initially improved to 20/30 from 20/40 - recommend YAG capsulotomy OS - monitor  9. Dry eyes OU  - persistent PEE on slit lamp exam.   - continue artificial tears and lubricating ointment PRN ---patient not using  - monitor  10.  Ocular Hypertension OU  - IOP today 22 OU  - continue Cosopt  bid OU   Ophthalmic Meds Ordered this visit:  No orders of the defined types were placed in this encounter.    No follow-ups on file.  There are no Patient Instructions on file for this visit.  This document serves as a record of services personally performed by Jeanice Millard, MD, PhD. It was created on their behalf by Mayola Specking, COA an ophthalmic technician. The creation of this record is the provider's dictation and/or activities during the visit.   Electronically signed by: Carrington Clack, COT  04/30/24  2:44 PM    Jeanice Millard, M.D., Ph.D. Diseases & Surgery of the Retina and Vitreous [facility]    Abbreviations: M myopia (nearsighted); A astigmatism; H hyperopia (farsighted); P presbyopia; Mrx spectacle prescription;  CTL contact lenses; OD right eye; OS left eye; OU both eyes  XT exotropia; ET esotropia; PEK punctate epithelial keratitis; PEE punctate epithelial erosions; DES dry eye syndrome; MGD meibomian gland dysfunction; ATs artificial tears; PFAT's preservative free artificial tears; NSC nuclear sclerotic cataract; PSC posterior subcapsular cataract; ERM epi-retinal membrane; PVD posterior vitreous detachment; RD retinal detachment; DM diabetes mellitus; DR diabetic retinopathy; NPDR non-proliferative diabetic retinopathy; PDR proliferative diabetic retinopathy; CSME clinically significant macular edema; DME diabetic macular edema; dbh dot blot hemorrhages; CWS cotton wool spot; POAG primary open angle glaucoma; C/D cup-to-disc ratio; HVF humphrey visual field; GVF goldmann visual field; OCT optical coherence tomography; IOP intraocular pressure; BRVO Branch retinal vein occlusion; CRVO central retinal vein occlusion; CRAO central retinal artery occlusion; BRAO branch retinal artery occlusion; RT retinal tear; SB scleral buckle; PPV pars plana vitrectomy; VH Vitreous hemorrhage; PRP  panretinal laser photocoagulation; IVK intravitreal kenalog; VMT vitreomacular traction; MH Macular hole;  NVD neovascularization of the disc; NVE neovascularization elsewhere; AREDS age related eye disease study; ARMD age related macular degeneration; POAG primary open angle glaucoma; EBMD epithelial/anterior basement membrane dystrophy; ACIOL anterior chamber intraocular lens; IOL intraocular lens;  PCIOL posterior chamber intraocular lens; Phaco/IOL phacoemulsification with intraocular lens placement; PRK photorefractive keratectomy; LASIK laser assisted in situ keratomileusis; HTN hypertension; DM diabetes mellitus; COPD chronic obstructive pulmonary disease

## 2024-05-09 NOTE — Progress Notes (Signed)
 [facility] - Clinic Note  05/20/2024     CHIEF COMPLAINT Patient presents for Retina Follow Up   HISTORY OF PRESENT ILLNESS: Monica Hamilton is a 83 y.o. female who presents to the clinic today for:   HPI     Retina Follow Up   Patient presents with  Diabetic Retinopathy.  In both eyes.  This started 7 months ago.  Duration of 7 weeks.  Since onset it is stable.  I, the attending physician,  performed the HPI with the patient and updated documentation appropriately.        Comments   7 week retina follow up NPDR IVV OD and IVA OS pt is reporting no vision changes noticed she denies any flashes has some floaters she is not checking her blood sugar at this time she is uding dorz/tim bid ou       Last edited by Valdemar Rogue, MD on 05/20/2024  9:55 PM.    Pt states she can't tell any difference in her vision, she started coughing while checking her vision today and states she couldn't hardly see anything  Referring physician: Self-referral -- wife of Monica Hamilton  HISTORICAL INFORMATION:  Selected notes from the MEDICAL RECORD NUMBER Self referral Ocular Hx: pseudophakia OU [place] 2019) PMH: DM (on metformin), HTN, arthritis,    CURRENT MEDICATIONS: Current Outpatient Medications (Ophthalmic Drugs)  Medication Sig   dorzolamide -timolol  (COSOPT ) 2-0.5 % ophthalmic solution Place 1 drop into both eyes 2 (two) times daily.   No current facility-administered medications for this visit. (Ophthalmic Drugs)   Current Outpatient Medications (Other)  Medication Sig   amLODipine  (NORVASC ) 5 MG tablet Take 5 mg by mouth daily.   aspirin 81 MG EC tablet Take 81 mg by mouth daily.    CINNAMON PO Take 1,000 mg by mouth daily.   gabapentin  (NEURONTIN ) 100 MG capsule gabapentin  100 mg capsule  TAKE 1 CAPSULE BY MOUTH ONCE DAILY AT BEDTIME FOR 30 DAYS   gabapentin  (NEURONTIN ) 300 MG capsule Take 600 mg by mouth at bedtime.   HYDROcodone -acetaminophen  (NORCO)  7.5-325 MG tablet Take 1 tablet by mouth every 6 (six) hours as needed for severe pain ((score 7 to 10)).   metFORMIN (GLUCOPHAGE) 1000 MG tablet Take 1,000 mg by mouth 2 (two) times daily.    methocarbamol  (ROBAXIN ) 500 MG tablet Take 1 tablet (500 mg total) by mouth every 6 (six) hours as needed for muscle spasms.   metoprolol  succinate (TOPROL -XL) 50 MG 24 hr tablet Take 50 mg by mouth daily. Take with or immediately following a meal.   MODERNA COVID-19 VACCINE 100 MCG/0.5ML injection    Multiple Vitamins-Minerals (ICAPS AREDS 2) CAPS Take 1 capsule by mouth 2 (two) times daily.    rosuvastatin (CRESTOR) 20 MG tablet Take by mouth.   valsartan -hydrochlorothiazide  (DIOVAN -HCT) 160-25 MG tablet Take 1 tablet by mouth daily.   No current facility-administered medications for this visit. (Other)   REVIEW OF SYSTEMS: ROS   Positive for: Musculoskeletal, Endocrine, Cardiovascular, Eyes Negative for: Constitutional, Gastrointestinal, Neurological, Skin, Genitourinary, HENT, Respiratory, Psychiatric, Allergic/Imm, Heme/Lymph Last edited by Resa Delon ORN, COT on 05/20/2024  1:42 PM.       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   Past Surgical History:  Procedure Laterality Date   ABDOMINAL HYSTERECTOMY     APPENDECTOMY     BLADDER SUSPENSION     BREAST BIOPSY Left    BREAST EXCISIONAL BIOPSY Left    CATARACT EXTRACTION     CHOLECYSTECTOMY     DILATION AND CURETTAGE OF  UTERUS     EYE SURGERY     HEAD & NECK SKIN LESION EXCISIONAL BIOPSY     top left scalp   LUMBAR LAMINECTOMY/DECOMPRESSION MICRODISCECTOMY N/A 08/21/2019   Procedure: Lumbar decompression L3-4 right, microdiscectomy L3-4 right;  Surgeon: Heide Ingle, MD;  Location: [facility];  Service: Orthopedics;  Laterality: N/A;    SHOULDER ARTHROSCOPY WITH SUBACROMIAL DECOMPRESSION Right 01/30/2013   Procedure: SHOULDER ARTHROSCOPY WITH SUBACROMIAL DECOMPRESSION;  Surgeon: Lynwood SHAUNNA Bern, MD;  Location: [facility];  Service: Orthopedics;  Laterality: Right;  with labral debridement   FAMILY HISTORY Family History  Problem Relation Age of Onset   Heart disease Father    Breast cancer Maternal Aunt    Diabetes Brother    Leukemia Sister    Colon cancer Neg Hx    Esophageal cancer Neg Hx    Rectal cancer Neg Hx    Stomach cancer Neg Hx    SOCIAL HISTORY Social History   Tobacco Use   Smoking status: Never   Smokeless tobacco: Never  Vaping Use   Vaping status: Never Used  Substance Use Topics   Alcohol  use: No   Drug use: No       OPHTHALMIC EXAM: Base Eye Exam     Visual Acuity (Snellen - Linear)       Right Left   Dist [place] 20/40 -2 20/200 -3   Dist ph [place] NI NI         Tonometry (Tonopen, 1:47 PM)       Right Left   Pressure 19 19         Pupils       Pupils Dark Light Shape React APD   Right PERRL 3 2 Round Brisk None   Left PERRL 3 2 Round Brisk None         Neuro/Psych     Oriented x3: Yes   Mood/Affect: Normal         Dilation     Both eyes: 2.5% Phenylephrine  @ 1:47 PM           Slit Lamp and Fundus Exam     Slit Lamp Exam       Right Left   Lids/Lashes Dermatochalasis - upper lid Dermatochalasis - upper lid, Meibomian gland dysfunction   Conjunctiva/Sclera nasal and temporal pinguecula Nasal Pinguecula   Cornea tear film debris, 3+ PEE, mild arcus, well healed cataract wound 3+ Punctate epithelial erosions, Arcus, Temporal Well  healed cataract wounds, trace tear film debris, decreased TBUT   Anterior Chamber Deep and quiet Deep and quiet   Iris Poorly dilated Round and dilated   Lens PC IOL in good position, Open posterior capsule PC IOL in good position, 1-2+Posterior capsular opacification   Anterior Vitreous Vitreous syneresis, Posterior vitreous detachment, vitreous condensations Vitreous  syneresis, Posterior vitreous detachment         Fundus Exam       Right Left   Disc Pink and Sharp Pink and Sharp, mild temporal Peripapillary atrophy, mild PPP   C/D Ratio 0.5 0.4   Macula Flat, Blunted foveal reflex, RPE mottling and clumping, mild atrophy, Drusen, rare MA, trace cystic changes temporal mac -- slightly improved Blunted foveal reflex, +CNV with pigment ring -- no surrounding edema -- stably improved, rare MA, +drusen, RPE mottling   Vessels attenuated, mild tortuosity attenuated, Tortuous   Periphery Attached, scattered mild Reticular degeneration, no heme Attached, scattered Reticular degeneration, No heme.           IMAGING AND PROCEDURES  Imaging and Procedures for @TODAY @  OCT, Retina - OU - Both Eyes       Right Eye Quality was good. Central Foveal Thickness: 261. Progression has improved. Findings include normal foveal contour, no SRF, myopic contour, retinal drusen , intraretinal fluid, outer retinal atrophy (Mild interval improvement in IRF/cystic changes IT macula and fovea -- just trace cystic changes remain, Patchy ORA -- stable).   Left Eye Quality was good. Central Foveal Thickness: 264. Progression has been stable. Findings include no IRF, no SRF, abnormal foveal contour, retinal drusen , subretinal hyper-reflective material, intraretinal hyper-reflective material, pigment epithelial detachment, outer retinal atrophy (Persistent focal PED/CNV with stable improvement in overlying SRHM and retinal edema, scattered cystic changes).   Notes *Images captured and stored on  drive  Diagnosis / Impression: Mild DME OU OD: Mild interval improvement in IRF/cystic changes IT macula and fovea -- just trace cystic changes remain, Patchy ORA -- stable OS: Exudative ARMD - Persistent focal PED/CNV with stable improvement in overlying SRHM and retinal edema, scattered cystic changes  Clinical management:  See below  Abbreviations: NFP - Normal foveal profile. CME - cystoid macular edema. PED - pigment epithelial detachment. IRF - intraretinal fluid. SRF - subretinal fluid. EZ - ellipsoid zone. ERM - epiretinal membrane. ORA - outer retinal atrophy. ORT - outer retinal tubulation. SRHM - subretinal hyper-reflective material       Intravitreal Injection, Pharmacologic Agent - OD - Right Eye       Time Out 05/20/2024. 2:52 PM. Confirmed correct patient, procedure, site, and patient consented.   Anesthesia Topical anesthesia was used. Anesthetic medications included Lidocaine  2%, Proparacaine 0.5%.   Procedure Preparation included 5% betadine  to ocular surface, eyelid speculum. A (32g) needle was used.   Injection: 6 mg faricimab -svoa 6 MG/0.05ML (Patient supplied)   Route: Intravitreal, Site: Right Eye   NDC: 49757-903-98, Lot: [id number], Expiration date: 03/20/2025, Waste: 0 mL   Post-op Post injection exam found visual acuity of at least counting fingers. The patient tolerated the procedure well. There were no complications. The patient received written and verbal post procedure care education. Post injection medications were not given.   Notes **PAP MEDICATION ADMINISTERED**           ASSESSMENT/PLAN:   ICD-10-CM   1. Exudative age-related macular degeneration of left eye with active choroidal neovascularization (HCC)  H35.3221 OCT, Retina - OU - Both Eyes    2. Intermediate stage nonexudative age-related macular degeneration of right eye  H35.3112     3. Both eyes affected by mild nonproliferative diabetic retinopathy with macular edema, associated  with type 2 diabetes mellitus (HCC)  E11.3213 OCT, Retina - OU - Both Eyes    Intravitreal Injection, Pharmacologic Agent - OD - Right Eye    faricimab -svoa (VABYSMO )  6mg /0.21mL intravitreal injection    4. Long term (current) use of oral hypoglycemic drugs  Z79.84     5. Amblyopia of left eye  H53.002     6. Posterior vitreous detachment of left eye  H43.812     7. Pseudophakia of both eyes  Z96.1     8. Bilateral posterior capsular opacification  H26.493     9. 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, OS   - interval conversion to exudative ARMD noted on 7.26.22 - s/p IVA OS #1 (07.26.22), #2 (08.23.22), #3 (09.21.22), #4 (10.13.22), #5 (11.16.22), #6 (12.14.22), #7 (01.11.23), #8 (02.08.23), #9 (03.22.23), #10 (06.07.23), #11 (08.02.23), #12 (10.25.23), #13 (12.29.23)  - BCVA OS stable at 20/200 (stable and at amblyopic baseline) - OCT shows OS: Exudative ARMD - Persistent focal PED/CNV with stable improvement in overlying SRHM and retinal edema, scattered cystic changes - will hold off on injection OS again today - pt in agreement - f/u in 8 wks, DFE, OCT   2. Intermediate Age related macular degeneration, non-exudative, OD -- stable - exam/OCT today shows OD: persistent IRF/cystic changes IT macula and fovea -- slightly improved; Patchy ORA - stable  - suspect cystic changes mostly related to DM and HTN  - BCVA OD 20/30 from 20/40  - FA 01.04.21 - no CNVM OU -- no exudative disease  - continue amsler grid monitoring    3,4. Mild non-proliferative diabetic retinopathy, both eyes  - A1c: 6.2 on 10.09.24 - s/p IVA OD #1 (01.29.24), #2 (02.26.24), #3 (03.25.24), #4 (04.22.24), #5 (05.28.24) -- IVA resistance - s/p IVE OD #1 (07.09.24), #2 (08.12.24), #3 (09.09.24), #4 (10.07.24), #5 (11.04.24) -- IVE resistance - s/p IVV OD #1 (SAMPLE -- 01.13.25), #2 (SAMPLE -- 02.17.25),  #3 (SAMPLE -- 03.24.25), #4  (sample -- 05.05.25) - exam shows rare MA/IRH - FA 01.04.21 - late leaking, perifoveal MA OU; no NV OU - OCT OD shows OD: Mild interval improvement in IRF/cystic changes IT macula and fovea -- trace cystic changes remain, Patchy ORA -- stable; OS: Exudative ARMD - Persistent focal PED/CNV with stable improvement in overlying SRHM and retinal edema, scattered cystic changes at 8 wks - BCVA OD decreased to 2/40 from 20/30  - recommend IVV OD #5 (PAP) today, 06.30.25 with follow up in 8 weeks again  - Pt wishes to proceed w/ injection - RBA of procedure discussed, questions answered - IVV informed consent obtained and signed 01.13.25 - see procedure note  - f/u 8 weeks DFE, OCT, likely injection OD - approved for Vabysmo  assistance program   5. Severe amblyopia OS  - history of patching as a child -- no strabismus surgery  - VA dropped down to 20/300 from 20/250 (baseline) due to conv to exu ARMD on 07.26.22   6. PVD / vitreous syneresis OS  - Discussed findings and prognosis  - No RT or RD on 360 scleral depressed exam  - Reviewed s/s of RT/RD  - strict return precaution sfor any such RT/RD symptoms   7. Pseudophakia OU  - s/p CE/IOL OU by Roz  - beautiful surgeries, doing well - continue to monitor    8. PCO OU  - pt reports some fogginess of vision - s/p YAG Cap OD (01.15.24) -- good opening -- BCVA initially improved to 20/30 from 20/40 - recommend YAG capsulotomy OS - monitor  9. Dry eyes OU  -  persistent PEE on slit lamp exam.   - continue artificial tears and lubricating ointment PRN ---patient not using  - monitor  10. Ocular Hypertension OU  - IOP today 19 OU  - continue Cosopt  bid OU   Ophthalmic Meds Ordered this visit:  Meds ordered this encounter  Medications   faricimab -svoa (VABYSMO ) 6mg /0.8mL intravitreal injection     Return in about 8 weeks (around 07/15/2024) for f/u NPDR OU, DFE, OCT, Possible Injxn.  There are no Patient Instructions on file  for this visit.  This document serves as a record of services personally performed by Redell JUDITHANN Hans, MD, PhD. It was created on their behalf by Avelina Pereyra, COA an ophthalmic technician. The creation of this record is the provider's dictation and/or activities during the visit.   Electronically signed by: Avelina GORMAN Pereyra, COT  05/20/24  10:00 PM   This document serves as a record of services personally performed by Redell JUDITHANN Hans, MD, PhD. It was created on their behalf by Alan PARAS. Delores, OA an ophthalmic technician. The creation of this record is the provider's dictation and/or activities during the visit.    Electronically signed by: Alan PARAS. Delores, OA 05/20/24 10:00 PM  Redell JUDITHANN Hans, M.D., Ph.D. Diseases & Surgery of the Retina and Vitreous [facility]  I have reviewed the above documentation for accuracy and completeness, and I agree with the above. Redell JUDITHANN Hans, M.D., Ph.D. 05/20/24 10:06 PM   Abbreviations: M myopia (nearsighted); A astigmatism; H hyperopia (farsighted); P presbyopia; Mrx spectacle prescription;  CTL contact lenses; OD right eye; OS left eye; OU both eyes  XT exotropia; ET esotropia; PEK punctate epithelial keratitis; PEE punctate epithelial erosions; DES dry eye syndrome; MGD meibomian gland dysfunction; ATs artificial tears; PFAT's preservative free artificial tears; NSC nuclear sclerotic cataract; PSC posterior subcapsular cataract; ERM epi-retinal membrane; PVD posterior vitreous detachment; RD retinal detachment; DM diabetes mellitus; DR diabetic retinopathy; NPDR non-proliferative diabetic retinopathy; PDR proliferative diabetic retinopathy; CSME clinically significant macular edema; DME diabetic macular edema; dbh dot blot hemorrhages; CWS cotton wool spot; POAG primary open angle glaucoma; C/D cup-to-disc ratio; HVF humphrey visual field; GVF goldmann visual field; OCT optical coherence tomography; IOP intraocular pressure; BRVO  Branch retinal vein occlusion; CRVO central retinal vein occlusion; CRAO central retinal artery occlusion; BRAO branch retinal artery occlusion; RT retinal tear; SB scleral buckle; PPV pars plana vitrectomy; VH Vitreous hemorrhage; PRP panretinal laser photocoagulation; IVK intravitreal kenalog; VMT vitreomacular traction; MH Macular hole;  NVD neovascularization of the disc; NVE neovascularization elsewhere; AREDS age related eye disease study; ARMD age related macular degeneration; POAG primary open angle glaucoma; EBMD epithelial/anterior basement membrane dystrophy; ACIOL anterior chamber intraocular lens; IOL intraocular lens; PCIOL posterior chamber intraocular lens; Phaco/IOL phacoemulsification with intraocular lens placement; PRK photorefractive keratectomy; LASIK laser assisted in situ keratomileusis; HTN hypertension; DM diabetes mellitus; COPD chronic obstructive pulmonary disease

## 2024-05-13 ENCOUNTER — Encounter (INDEPENDENT_AMBULATORY_CARE_PROVIDER_SITE_OTHER): Admitting: Ophthalmology

## 2024-05-13 DIAGNOSIS — H353221 Exudative age-related macular degeneration, left eye, with active choroidal neovascularization: Secondary | ICD-10-CM

## 2024-05-13 DIAGNOSIS — H43812 Vitreous degeneration, left eye: Secondary | ICD-10-CM

## 2024-05-13 DIAGNOSIS — H26493 Other secondary cataract, bilateral: Secondary | ICD-10-CM

## 2024-05-13 DIAGNOSIS — H04123 Dry eye syndrome of bilateral lacrimal glands: Secondary | ICD-10-CM

## 2024-05-13 DIAGNOSIS — Z7984 Long term (current) use of oral hypoglycemic drugs: Secondary | ICD-10-CM

## 2024-05-13 DIAGNOSIS — Z961 Presence of intraocular lens: Secondary | ICD-10-CM

## 2024-05-13 DIAGNOSIS — E113213 Type 2 diabetes mellitus with mild nonproliferative diabetic retinopathy with macular edema, bilateral: Secondary | ICD-10-CM

## 2024-05-13 DIAGNOSIS — H353112 Nonexudative age-related macular degeneration, right eye, intermediate dry stage: Secondary | ICD-10-CM

## 2024-05-13 DIAGNOSIS — H40053 Ocular hypertension, bilateral: Secondary | ICD-10-CM

## 2024-05-13 DIAGNOSIS — H53002 Unspecified amblyopia, left eye: Secondary | ICD-10-CM

## 2024-05-20 ENCOUNTER — Encounter (INDEPENDENT_AMBULATORY_CARE_PROVIDER_SITE_OTHER): Payer: Self-pay | Admitting: Ophthalmology

## 2024-05-20 ENCOUNTER — Ambulatory Visit (INDEPENDENT_AMBULATORY_CARE_PROVIDER_SITE_OTHER): Admitting: Ophthalmology

## 2024-05-20 DIAGNOSIS — H353112 Nonexudative age-related macular degeneration, right eye, intermediate dry stage: Secondary | ICD-10-CM

## 2024-05-20 DIAGNOSIS — H353221 Exudative age-related macular degeneration, left eye, with active choroidal neovascularization: Secondary | ICD-10-CM

## 2024-05-20 DIAGNOSIS — Z961 Presence of intraocular lens: Secondary | ICD-10-CM | POA: Diagnosis not present

## 2024-05-20 DIAGNOSIS — H26493 Other secondary cataract, bilateral: Secondary | ICD-10-CM | POA: Diagnosis not present

## 2024-05-20 DIAGNOSIS — H53002 Unspecified amblyopia, left eye: Secondary | ICD-10-CM

## 2024-05-20 DIAGNOSIS — Z7984 Long term (current) use of oral hypoglycemic drugs: Secondary | ICD-10-CM | POA: Diagnosis not present

## 2024-05-20 DIAGNOSIS — H04123 Dry eye syndrome of bilateral lacrimal glands: Secondary | ICD-10-CM

## 2024-05-20 DIAGNOSIS — E113213 Type 2 diabetes mellitus with mild nonproliferative diabetic retinopathy with macular edema, bilateral: Secondary | ICD-10-CM | POA: Diagnosis not present

## 2024-05-20 DIAGNOSIS — H43812 Vitreous degeneration, left eye: Secondary | ICD-10-CM

## 2024-05-20 DIAGNOSIS — H40053 Ocular hypertension, bilateral: Secondary | ICD-10-CM

## 2024-05-20 MED ORDER — FARICIMAB-SVOA 6 MG/0.05ML IZ SOLN
6.0000 mg | INTRAVITREAL | Status: AC | PRN
Start: 2024-05-20 — End: 2024-05-20
  Administered 2024-05-20: 6 mg via INTRAVITREAL

## 2024-05-20 NOTE — Addendum Note (Signed)
 Addended by: Jackye Dever G on: 05/20/2024 10:10 PM   Modules accepted: Level of Service

## 2024-06-03 DIAGNOSIS — I129 Hypertensive chronic kidney disease with stage 1 through stage 4 chronic kidney disease, or unspecified chronic kidney disease: Secondary | ICD-10-CM | POA: Diagnosis not present

## 2024-06-03 DIAGNOSIS — E785 Hyperlipidemia, unspecified: Secondary | ICD-10-CM | POA: Diagnosis not present

## 2024-06-03 DIAGNOSIS — N182 Chronic kidney disease, stage 2 (mild): Secondary | ICD-10-CM | POA: Diagnosis not present

## 2024-06-03 DIAGNOSIS — E1129 Type 2 diabetes mellitus with other diabetic kidney complication: Secondary | ICD-10-CM | POA: Diagnosis not present

## 2024-06-04 ENCOUNTER — Other Ambulatory Visit: Payer: Self-pay | Admitting: Internal Medicine

## 2024-06-04 DIAGNOSIS — Z1231 Encounter for screening mammogram for malignant neoplasm of breast: Secondary | ICD-10-CM

## 2024-07-01 NOTE — Progress Notes (Signed)
 [facility] - Clinic Note  07/15/2024     CHIEF COMPLAINT Patient presents for Retina Follow Up   HISTORY OF PRESENT ILLNESS: Monica Hamilton is a 83 y.o. female who presents to the clinic today for:   HPI     Retina Follow Up   Patient presents with  Wet AMD.  In left eye.  Severity is moderate.  Duration of 8 weeks.  Since onset it is stable.  I, the attending physician,  performed the HPI with the patient and updated documentation appropriately.        Comments   8 week Retina eval. Patient states things seem the same. The only difference is more floaters this time      Last edited by Valdemar Rogue, MD on 07/15/2024  6:11 PM.     Pt states she can't tell any difference in her vision, she started coughing while checking her vision today and states she couldn't hardly see anything  Referring physician: Self-referral -- wife of Caylie Sandquist  HISTORICAL INFORMATION:  Selected notes from the MEDICAL RECORD NUMBER Self referral Ocular Hx: pseudophakia OU [place] 2019) PMH: DM (on metformin), HTN, arthritis,    CURRENT MEDICATIONS: Current Outpatient Medications (Ophthalmic Drugs)  Medication Sig   dorzolamide-timolol (COSOPT) 2-0.5 % ophthalmic solution Place 1 drop into both eyes 2 (two) times daily.   No current facility-administered medications for this visit. (Ophthalmic Drugs)   Current Outpatient Medications (Other)  Medication Sig   amLODipine (NORVASC) 5 MG tablet Take 5 mg by mouth daily.   aspirin 81 MG EC tablet Take 81 mg by mouth daily.    CINNAMON PO Take 1,000 mg by mouth daily.   gabapentin (NEURONTIN) 100 MG capsule gabapentin 100 mg capsule  TAKE 1 CAPSULE BY MOUTH ONCE DAILY AT BEDTIME FOR 30 DAYS   HYDROcodone-acetaminophen (NORCO) 7.5-325 MG tablet Take 1 tablet by mouth every 6 (six) hours as needed for severe pain ((score 7 to 10)).   metFORMIN (GLUCOPHAGE) 1000 MG tablet Take 1,000 mg by mouth 2 (two) times daily.     methocarbamol (ROBAXIN) 500 MG tablet Take 1 tablet (500 mg total) by mouth every 6 (six) hours as needed for muscle spasms.   metoprolol succinate (TOPROL-XL) 50 MG 24 hr tablet Take 50 mg by mouth daily. Take with or immediately following a meal.   MODERNA COVID-19 VACCINE 100 MCG/0.5ML injection    Multiple Vitamins-Minerals (ICAPS AREDS 2) CAPS Take 1 capsule by mouth 2 (two) times daily.    rosuvastatin (CRESTOR) 20 MG tablet Take by mouth.   valsartan-hydrochlorothiazide (DIOVAN-HCT) 160-25 MG tablet Take 1 tablet by mouth daily.   gabapentin (NEURONTIN) 300 MG capsule Take 600 mg by mouth at bedtime.   No current facility-administered medications for this visit. (Other)   REVIEW OF SYSTEMS: ROS   Positive for: Musculoskeletal, Endocrine, Cardiovascular, Eyes Negative for: Constitutional, Gastrointestinal, Neurological, Skin, Genitourinary, HENT, Respiratory, Psychiatric, Allergic/Imm, Heme/Lymph Last edited by German Olam BRAVO, COT on 07/15/2024 12:41 PM.     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   Past Surgical History:  Procedure Laterality Date   ABDOMINAL HYSTERECTOMY     APPENDECTOMY     BLADDER SUSPENSION     BREAST BIOPSY Left    BREAST EXCISIONAL BIOPSY Left    CATARACT EXTRACTION     CHOLECYSTECTOMY     DILATION AND CURETTAGE OF UTERUS     EYE SURGERY     HEAD & NECK SKIN LESION EXCISIONAL BIOPSY     top left scalp   LUMBAR LAMINECTOMY/DECOMPRESSION  MICRODISCECTOMY N/A 08/21/2019   Procedure: Lumbar decompression L3-4 right, microdiscectomy L3-4 right;  Surgeon: Heide Ingle, MD;  Location: [facility];  Service: Orthopedics;  Laterality: N/A;    SHOULDER ARTHROSCOPY WITH SUBACROMIAL DECOMPRESSION Right 01/30/2013   Procedure: SHOULDER ARTHROSCOPY WITH SUBACROMIAL DECOMPRESSION;  Surgeon: Lynwood SHAUNNA Bern, MD;  Location: [facility];  Service: Orthopedics;  Laterality: Right;  with labral debridement   FAMILY HISTORY Family History  Problem Relation Age of Onset   Heart disease Father    Breast cancer Maternal Aunt    Diabetes Brother    Leukemia Sister    Colon cancer Neg Hx    Esophageal cancer Neg Hx    Rectal cancer Neg Hx    Stomach cancer Neg Hx    SOCIAL HISTORY Social History   Tobacco Use   Smoking status: Never   Smokeless tobacco: Never  Vaping Use   Vaping status: Never Used  Substance Use Topics   Alcohol use: No   Drug use: No       OPHTHALMIC EXAM: Base Eye Exam     Visual Acuity (Snellen - Linear)       Right Left   Dist [place] 20/40 -2 20/400   Dist ph [place] 20/30 20/NI         Tonometry (Tonopen, 12:44 PM)       Right Left   Pressure 20 17         Pupils       Dark Light Shape React APD   Right 3 2 Round Brisk None   Left 3 2 Round Brisk None         Visual Fields (Counting fingers)       Left Right    Full Full         Extraocular Movement       Right Left    Full, Ortho Full, Ortho         Neuro/Psych     Oriented x3: Yes   Mood/Affect: Normal         Dilation     Both eyes: 1.0% Mydriacyl, 2.5% Phenylephrine @ 12:44 PM           Slit Lamp and Fundus Exam     Slit Lamp Exam       Right Left   Lids/Lashes Dermatochalasis - upper lid Dermatochalasis - upper lid, Meibomian gland dysfunction   Conjunctiva/Sclera nasal and temporal pinguecula Nasal Pinguecula   Cornea tear film debris, 3+ PEE, mild arcus, well healed cataract wound 3+ Punctate epithelial  erosions, Arcus, Temporal Well healed cataract wounds, trace tear film debris, decreased TBUT   Anterior Chamber Deep and quiet Deep and quiet   Iris Poorly dilated Round and dilated   Lens PC IOL in good position, Open  posterior capsule PC IOL in good position, 1-2+Posterior capsular opacification   Anterior Vitreous Vitreous syneresis, Posterior vitreous detachment, vitreous condensations Vitreous syneresis, Posterior vitreous detachment         Fundus Exam       Right Left   Disc Pink and Sharp Pink and Sharp, mild temporal Peripapillary atrophy, mild PPP   C/D Ratio 0.5 0.4   Macula Flat, Blunted foveal reflex, RPE mottling and clumping, mild atrophy, Drusen, rare MA, trace cystic changes temporal mac --improved, no edema Blunted foveal reflex, +CNV with pigment ring -- no surrounding edema -- stably improved, rare MA, +drusen, RPE mottling   Vessels attenuated, mild tortuosity attenuated, Tortuous   Periphery Attached, scattered mild Reticular degeneration, no heme Attached, scattered Reticular degeneration, No heme.           Refraction     Manifest Refraction (Auto)       Sphere Cylinder Axis Dist VA   Right       Left -0.75 +2.00 170 20/150-2           IMAGING AND PROCEDURES  Imaging and Procedures for @TODAY @  OCT, Retina - OU - Both Eyes       Right Eye Quality was good. Central Foveal Thickness: 263. Progression has been stable. Findings include normal foveal contour, no SRF, myopic contour, retinal drusen , intraretinal fluid, outer retinal atrophy (Stable improvement in IRF/cystic changes IT macula and fovea, Patchy ORA -- stable).   Left Eye Quality was good. Central Foveal Thickness: 264. Progression has been stable. Findings include no IRF, no SRF, abnormal foveal contour, retinal drusen , subretinal hyper-reflective material, intraretinal hyper-reflective material, pigment epithelial detachment, outer retinal atrophy (Persistent focal PED/CNV with stable  improvement in overlying SRHM and retinal edema).   Notes *Images captured and stored on drive  Diagnosis / Impression: Mild DME OU OD: Stable improvement in IRF/cystic changes IT macula and fovea, Patchy ORA -- stable OS: Exudative ARMD - Persistent focal PED/CNV with stable improvement in overlying SRHM and retinal edema  Clinical management:  See below  Abbreviations: NFP - Normal foveal profile. CME - cystoid macular edema. PED - pigment epithelial detachment. IRF - intraretinal fluid. SRF - subretinal fluid. EZ - ellipsoid zone. ERM - epiretinal membrane. ORA - outer retinal atrophy. ORT - outer retinal tubulation. SRHM - subretinal hyper-reflective material       Intravitreal Injection, Pharmacologic Agent - OD - Right Eye       Time Out 07/15/2024. 1:23 PM. Confirmed correct patient, procedure, site, and patient consented.   Anesthesia Topical anesthesia was used. Anesthetic medications included Lidocaine 2%, Proparacaine 0.5%.   Procedure Preparation included 5% betadine to ocular surface, eyelid speculum. A (32g) needle was used.   Injection: 6 mg faricimab-svoa 6 MG/0.05ML (Patient supplied)   Route: Intravitreal, Site: Right Eye   NDC: 49757-903-93, Lot: [id number], Expiration date: 03/20/2025, Waste: 0 mL   Post-op Post injection exam found visual acuity of at least counting fingers. The patient tolerated the procedure well. There were no complications. The patient received written and verbal post procedure care education. Post injection medications were not given.   Notes **PAP MEDICATION ADMINISTERED**           ASSESSMENT/PLAN:   ICD-10-CM   1. Exudative age-related macular degeneration of left eye with active choroidal neovascularization (HCC)  H35.3221 OCT, Retina - OU - Both Eyes    2. Intermediate stage nonexudative age-related macular degeneration of right eye  H35.3112  3. Both eyes affected by mild nonproliferative diabetic retinopathy with  macular edema, associated with type 2 diabetes mellitus (HCC)  E11.3213 OCT, Retina - OU - Both Eyes    Intravitreal Injection, Pharmacologic Agent - OD - Right Eye    faricimab-svoa (VABYSMO) 6mg /0.91mL intravitreal injection    4. Long term (current) use of oral hypoglycemic drugs  Z79.84     5. Amblyopia of left eye  H53.002     6. Posterior vitreous detachment of left eye  H43.812     7. Pseudophakia of both eyes  Z96.1     8. Bilateral posterior capsular opacification  H26.493     9. 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, OS   - interval conversion to exudative ARMD noted on 7.26.22 - s/p IVA OS #1 (07.26.22), #2 (08.23.22), #3 (09.21.22), #4 (10.13.22), #5 (11.16.22), #6 (12.14.22), #7 (01.11.23), #8 (02.08.23), #9 (03.22.23), #10 (06.07.23), #11 (08.02.23), #12 (10.25.23), #13 (12.29.23)  - BCVA OS stable at 20/200 (stable and at amblyopic baseline) - OCT shows OS: Exudative ARMD - Persistent focal PED/CNV with stable improvement in overlying SRHM and retinal edema, scattered cystic changes - will hold off on injection OS again today - pt in agreement - f/u in 8 wks, DFE, OCT  2. Intermediate Age related macular degeneration, non-exudative, OD -- stable - exam/OCT today shows OD: persistent IRF/cystic changes IT macula and fovea -- slightly improved; Patchy ORA - stable  - suspect cystic changes mostly related to DM and HTN  - BCVA OD 20/30 from 20/40  - FA 01.04.21 - no CNVM OU -- no exudative disease  - continue amsler grid monitoring   3,4. Mild non-proliferative diabetic retinopathy, both eyes  - A1c: 6.2 on 10.09.24 - s/p IVA OD #1 (01.29.24), #2 (02.26.24), #3 (03.25.24), #4 (04.22.24), #5 (05.28.24) -- IVA resistance - s/p IVE OD #1 (07.09.24), #2 (08.12.24), #3 (09.09.24), #4 (10.07.24), #5 (11.04.24) -- IVE resistance - s/p IVV OD #1 (SAMPLE -- 01.13.25), #2 (SAMPLE -- 02.17.25),  #3  (SAMPLE -- 03.24.25), #4 (sample -- 05.05.25), #5 (06.30.25) - exam shows rare MA/IRH - FA 01.04.21 - late leaking, perifoveal MA OU; no NV OU - OCT OD shows OD: Stable interval improvement in IRF/cystic changes IT macula and fovea, Patchy ORA -- stable; OS: Exudative ARMD - Persistent focal PED/CNV with stable improvement in overlying SRHM and retinal edema at 8 wks - BCVA OD 20/30 improved from 20/40  - recommend IVV today OD #6 (08.25.25) PAP with follow up ext to 10 week  - Pt wishes to proceed w/ injection - RBA of procedure discussed, questions answered - IVV informed consent obtained and signed 01.13.25 - see procedure note  - f/u 10 weeks DFE, OCT, likely injection OD - approved for [employer] assistance program   5. Severe amblyopia OS  - history of patching as a child -- no strabismus surgery  - VA dropped down to 20/300 from 20/250 (baseline) due to conv to exu ARMD on 07.26.2022   6. PVD / vitreous syneresis OS  - Discussed findings and prognosis  - No RT or RD on 360 scleral depressed exam  - Reviewed s/s of RT/RD  - strict return precaution sfor any such RT/RD symptoms   7. Pseudophakia OU  - s/p CE/IOL OU by Roz  - beautiful surgeries, doing well - continue to monitor   8. PCO OU  -  pt reports some fogginess of vision - s/p YAG Cap OD (01.15.24) -- good opening -- BCVA initially improved to 20/30 from 20/40 - recommend YAG capsulotomy OS - monitor  9. Dry eyes OU  - persistent PEE on slit lamp exam.   - continue artificial tears and lubricating ointment PRN ---patient not using  - monitor  10. Ocular Hypertension OU  - IOP 20, 17  - continue Cosopt BID OU  Ophthalmic Meds Ordered this visit:  Meds ordered this encounter  Medications   faricimab-svoa (VABYSMO) 6mg /0.45mL intravitreal injection     Return in about 10 weeks (around 09/23/2024) for NPDR OU, DFE, OCT, Possible Injxn.  There are no Patient Instructions on file for this visit.  This  document serves as a record of services personally performed by Redell JUDITHANN Hans, MD, PhD. It was created on their behalf by Avelina Pereyra, COA an ophthalmic technician. The creation of this record is the provider's dictation and/or activities during the visit.   Electronically signed by: Avelina GORMAN Pereyra, COT  07/15/24  6:12 PM   Redell JUDITHANN Hans, M.D., Ph.D. Diseases & Surgery of the Retina and Vitreous [facility]  I have reviewed the above documentation for accuracy and completeness, and I agree with the above. Redell JUDITHANN Hans, M.D., Ph.D. 07/15/24 6:41 PM   Abbreviations: M myopia (nearsighted); A astigmatism; H hyperopia (farsighted); P presbyopia; Mrx spectacle prescription;  CTL contact lenses; OD right eye; OS left eye; OU both eyes  XT exotropia; ET esotropia; PEK punctate epithelial keratitis; PEE punctate epithelial erosions; DES dry eye syndrome; MGD meibomian gland dysfunction; ATs artificial tears; PFAT's preservative free artificial tears; NSC nuclear sclerotic cataract; PSC posterior subcapsular cataract; ERM epi-retinal membrane; PVD posterior vitreous detachment; RD retinal detachment; DM diabetes mellitus; DR diabetic retinopathy; NPDR non-proliferative diabetic retinopathy; PDR proliferative diabetic retinopathy; CSME clinically significant macular edema; DME diabetic macular edema; dbh dot blot hemorrhages; CWS cotton wool spot; POAG primary open angle glaucoma; C/D cup-to-disc ratio; HVF humphrey visual field; GVF goldmann visual field; OCT optical coherence tomography; IOP intraocular pressure; BRVO Branch retinal vein occlusion; CRVO central retinal vein occlusion; CRAO central retinal artery occlusion; BRAO branch retinal artery occlusion; RT retinal tear; SB scleral buckle; PPV pars plana vitrectomy; VH Vitreous hemorrhage; PRP panretinal laser photocoagulation; IVK intravitreal kenalog; VMT vitreomacular traction; MH Macular hole;  NVD neovascularization of  the disc; NVE neovascularization elsewhere; AREDS age related eye disease study; ARMD age related macular degeneration; POAG primary open angle glaucoma; EBMD epithelial/anterior basement membrane dystrophy; ACIOL anterior chamber intraocular lens; IOL intraocular lens; PCIOL posterior chamber intraocular lens; Phaco/IOL phacoemulsification with intraocular lens placement; PRK photorefractive keratectomy; LASIK laser assisted in situ keratomileusis; HTN hypertension; DM diabetes mellitus; COPD chronic obstructive pulmonary disease

## 2024-07-11 DIAGNOSIS — M25512 Pain in left shoulder: Secondary | ICD-10-CM | POA: Diagnosis not present

## 2024-07-11 DIAGNOSIS — M25511 Pain in right shoulder: Secondary | ICD-10-CM | POA: Diagnosis not present

## 2024-07-15 ENCOUNTER — Encounter (INDEPENDENT_AMBULATORY_CARE_PROVIDER_SITE_OTHER): Payer: Self-pay | Admitting: Ophthalmology

## 2024-07-15 ENCOUNTER — Ambulatory Visit (INDEPENDENT_AMBULATORY_CARE_PROVIDER_SITE_OTHER): Admitting: Ophthalmology

## 2024-07-15 DIAGNOSIS — H353221 Exudative age-related macular degeneration, left eye, with active choroidal neovascularization: Secondary | ICD-10-CM | POA: Diagnosis not present

## 2024-07-15 DIAGNOSIS — Z7984 Long term (current) use of oral hypoglycemic drugs: Secondary | ICD-10-CM

## 2024-07-15 DIAGNOSIS — H43812 Vitreous degeneration, left eye: Secondary | ICD-10-CM

## 2024-07-15 DIAGNOSIS — H353112 Nonexudative age-related macular degeneration, right eye, intermediate dry stage: Secondary | ICD-10-CM

## 2024-07-15 DIAGNOSIS — Z961 Presence of intraocular lens: Secondary | ICD-10-CM

## 2024-07-15 DIAGNOSIS — H40053 Ocular hypertension, bilateral: Secondary | ICD-10-CM

## 2024-07-15 DIAGNOSIS — H53002 Unspecified amblyopia, left eye: Secondary | ICD-10-CM

## 2024-07-15 DIAGNOSIS — E113213 Type 2 diabetes mellitus with mild nonproliferative diabetic retinopathy with macular edema, bilateral: Secondary | ICD-10-CM

## 2024-07-15 DIAGNOSIS — H26493 Other secondary cataract, bilateral: Secondary | ICD-10-CM

## 2024-07-15 DIAGNOSIS — H04123 Dry eye syndrome of bilateral lacrimal glands: Secondary | ICD-10-CM

## 2024-07-15 MED ORDER — FARICIMAB-SVOA 6 MG/0.05ML IZ SOSY
6.0000 mg | PREFILLED_SYRINGE | INTRAVITREAL | Status: AC | PRN
Start: 2024-07-15 — End: 2024-07-15
  Administered 2024-07-15: 6 mg via INTRAVITREAL

## 2024-07-16 ENCOUNTER — Ambulatory Visit
Admission: RE | Admit: 2024-07-16 | Discharge: 2024-07-16 | Disposition: A | Discharge status: Home / Self Care | Source: Ambulatory Visit | Attending: Internal Medicine | Admitting: Internal Medicine

## 2024-07-16 DIAGNOSIS — Z1231 Encounter for screening mammogram for malignant neoplasm of breast: Secondary | ICD-10-CM

## 2024-07-26 ENCOUNTER — Other Ambulatory Visit: Payer: Self-pay | Admitting: Internal Medicine

## 2024-07-26 DIAGNOSIS — R928 Other abnormal and inconclusive findings on diagnostic imaging of breast: Secondary | ICD-10-CM

## 2024-08-01 ENCOUNTER — Ambulatory Visit
Admission: RE | Admit: 2024-08-01 | Discharge: 2024-08-01 | Disposition: A | Discharge status: Home / Self Care | Source: Ambulatory Visit | Attending: Internal Medicine | Admitting: Internal Medicine

## 2024-08-01 DIAGNOSIS — N6012 Diffuse cystic mastopathy of left breast: Secondary | ICD-10-CM | POA: Diagnosis not present

## 2024-08-01 DIAGNOSIS — R928 Other abnormal and inconclusive findings on diagnostic imaging of breast: Secondary | ICD-10-CM

## 2024-08-08 ENCOUNTER — Encounter (INDEPENDENT_AMBULATORY_CARE_PROVIDER_SITE_OTHER): Payer: Self-pay | Admitting: Ophthalmology

## 2024-08-08 ENCOUNTER — Ambulatory Visit (INDEPENDENT_AMBULATORY_CARE_PROVIDER_SITE_OTHER): Admitting: Ophthalmology

## 2024-08-08 DIAGNOSIS — Z7984 Long term (current) use of oral hypoglycemic drugs: Secondary | ICD-10-CM | POA: Diagnosis not present

## 2024-08-08 DIAGNOSIS — E113213 Type 2 diabetes mellitus with mild nonproliferative diabetic retinopathy with macular edema, bilateral: Secondary | ICD-10-CM

## 2024-08-08 DIAGNOSIS — H43812 Vitreous degeneration, left eye: Secondary | ICD-10-CM

## 2024-08-08 DIAGNOSIS — H40053 Ocular hypertension, bilateral: Secondary | ICD-10-CM

## 2024-08-08 DIAGNOSIS — H04123 Dry eye syndrome of bilateral lacrimal glands: Secondary | ICD-10-CM | POA: Diagnosis not present

## 2024-08-08 DIAGNOSIS — Z961 Presence of intraocular lens: Secondary | ICD-10-CM

## 2024-08-08 DIAGNOSIS — H26493 Other secondary cataract, bilateral: Secondary | ICD-10-CM

## 2024-08-08 DIAGNOSIS — H353221 Exudative age-related macular degeneration, left eye, with active choroidal neovascularization: Secondary | ICD-10-CM

## 2024-08-08 DIAGNOSIS — H353112 Nonexudative age-related macular degeneration, right eye, intermediate dry stage: Secondary | ICD-10-CM

## 2024-08-08 DIAGNOSIS — H209 Unspecified iridocyclitis: Secondary | ICD-10-CM

## 2024-08-08 DIAGNOSIS — H53002 Unspecified amblyopia, left eye: Secondary | ICD-10-CM

## 2024-08-08 MED ORDER — PREDNISOLONE ACETATE 1 % OP SUSP: 1.0000 [drp] | OPHTHALMIC | 3 refills | Status: DC

## 2024-08-08 MED ORDER — PROLENSA 0.07 % OP SOLN: 1.0000 [drp] | Freq: Four times a day (QID) | OPHTHALMIC | 4 refills | Status: AC

## 2024-08-08 NOTE — Progress Notes (Signed)
 [facility] - Clinic Note  08/08/2024     CHIEF COMPLAINT Patient presents for Retina Follow Up   HISTORY OF PRESENT ILLNESS: Monica Hamilton is a 83 y.o. female who presents to the clinic today for:   HPI     Retina Follow Up   Patient presents with  Other.  In right eye.  This started 3 weeks ago.  I, the attending physician,  performed the HPI with the patient and updated documentation appropriately.        Comments   Patient here for problem exam. After last injection OD is blurry half way on side and has had floaters galore. No eye pain. Only wanted OD dilated.      Last edited by Valdemar Rogue, MD on 08/08/2024  3:44 PM.    Pt states since last inj OD on 8.25 she's had floaters in OD, half of VA in OD is blurry. Pt has had floaters in the past after the shots but they dont usually last this long. Using cosopt  BID OU   Referring physician: Self-referral -- wife of Maddyn Lieurance  HISTORICAL INFORMATION:  Selected notes from the MEDICAL RECORD NUMBER Self referral Ocular Hx: pseudophakia OU [place] 2019) PMH: DM (on metformin), HTN, arthritis,    CURRENT MEDICATIONS: Current Outpatient Medications (Ophthalmic Drugs)  Medication Sig   Bromfenac  Sodium (PROLENSA ) 0.07 % SOLN Place 1 drop into the right eye in the morning, at noon, in the evening, and at bedtime.   dorzolamide -timolol  (COSOPT ) 2-0.5 % ophthalmic solution Place 1 drop into both eyes 2 (two) times daily.   prednisoLONE  acetate (PRED FORTE ) 1 % ophthalmic suspension Place 1 drop into the right eye every 2 (two) hours while awake.   No current facility-administered medications for this visit. (Ophthalmic Drugs)   Current Outpatient Medications (Other)  Medication Sig   amLODipine  (NORVASC ) 5 MG tablet Take 5 mg by mouth daily.   aspirin 81 MG EC tablet Take 81 mg by mouth daily.    CINNAMON PO Take 1,000 mg by mouth daily.   gabapentin  (NEURONTIN ) 100 MG capsule gabapentin  100 mg  capsule  TAKE 1 CAPSULE BY MOUTH ONCE DAILY AT BEDTIME FOR 30 DAYS   gabapentin  (NEURONTIN ) 300 MG capsule Take 600 mg by mouth at bedtime.   HYDROcodone -acetaminophen  (NORCO) 7.5-325 MG tablet Take 1 tablet by mouth every 6 (six) hours as needed for severe pain ((score 7 to 10)).   metFORMIN (GLUCOPHAGE) 1000 MG tablet Take 1,000 mg by mouth 2 (two) times daily.    methocarbamol  (ROBAXIN ) 500 MG tablet Take 1 tablet (500 mg total) by mouth every 6 (six) hours as needed for muscle spasms.   metoprolol  succinate (TOPROL -XL) 50 MG 24 hr tablet Take 50 mg by mouth daily. Take with or immediately following a meal.   MODERNA COVID-19 VACCINE 100 MCG/0.5ML injection    Multiple Vitamins-Minerals (ICAPS AREDS 2) CAPS Take 1 capsule by mouth 2 (two) times daily.    rosuvastatin (CRESTOR) 20 MG tablet Take by mouth.   valsartan -hydrochlorothiazide  (DIOVAN -HCT) 160-25 MG tablet Take 1 tablet by mouth daily.   No current facility-administered medications for this visit. (Other)   REVIEW OF SYSTEMS: ROS   Positive for: Musculoskeletal, Endocrine, Cardiovascular, Eyes Negative for: Constitutional, Gastrointestinal, Neurological, Skin, Genitourinary, HENT, Respiratory, Psychiatric, Allergic/Imm, Heme/Lymph Last edited by Orval Asberry RAMAN, COA on 08/08/2024  7:55 AM.      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    Age 61, led to Mitral valve prolapse   Past Surgical History:  Procedure Laterality Date   ABDOMINAL HYSTERECTOMY      APPENDECTOMY     BLADDER SUSPENSION     BREAST BIOPSY Left    BREAST EXCISIONAL BIOPSY Left    CATARACT EXTRACTION     CHOLECYSTECTOMY     DILATION AND CURETTAGE OF UTERUS     EYE SURGERY     HEAD & NECK SKIN LESION EXCISIONAL BIOPSY     top left scalp   LUMBAR LAMINECTOMY/DECOMPRESSION MICRODISCECTOMY N/A 08/21/2019   Procedure: Lumbar decompression L3-4 right, microdiscectomy L3-4 right;  Surgeon: Heide Ingle, MD;  Location: [facility];  Service: Orthopedics;  Laterality: N/A;    SHOULDER ARTHROSCOPY WITH SUBACROMIAL DECOMPRESSION Right 01/30/2013   Procedure: SHOULDER ARTHROSCOPY WITH SUBACROMIAL DECOMPRESSION;  Surgeon: Lynwood SHAUNNA Bern, MD;  Location: [facility];  Service: Orthopedics;  Laterality: Right;  with labral debridement   FAMILY HISTORY Family History  Problem Relation Age of Onset   Heart disease Father    Breast cancer Maternal Aunt    Diabetes Brother    Leukemia Sister    Colon cancer Neg Hx    Esophageal cancer Neg Hx    Rectal cancer Neg Hx    Stomach cancer Neg Hx    SOCIAL HISTORY Social History   Tobacco Use   Smoking status: Never   Smokeless tobacco: Never  Vaping Use   Vaping status: Never Used  Substance Use Topics   Alcohol  use: No   Drug use: No       OPHTHALMIC EXAM: Base Eye Exam     Visual Acuity (Snellen - Linear)       Right Left   Dist [place] 20/40 -2 20/400 -1   Dist ph [place] 20/30 -2 20/400 +1         Tonometry (Tonopen, 7:52 AM)       Right Left   Pressure 17 def         Pupils       Dark Light Shape React APD   Right 3 2 Round Brisk None   Left 3 2 Round Brisk None         Visual Fields (Counting fingers)       Left Right    Full Full         Extraocular Movement       Right Left    Full, Ortho Full, Ortho         Neuro/Psych     Oriented x3: Yes   Mood/Affect: Normal         Dilation     Right eye: 1.0% Mydriacyl, 2.5% Phenylephrine  @ 7:52 AM  Only wanted OD dilated.           Slit Lamp and Fundus Exam     Slit Lamp Exam       Right Left   Lids/Lashes Dermatochalasis - upper lid,  mild MGD Dermatochalasis - upper lid, Meibomian gland dysfunction   Conjunctiva/Sclera nasal and temporal pinguecula, white and quiet Nasal Pinguecula   Cornea Arcus, 1+ PEE, fine KP, mild guttata, well healed cataract wound 3+ Punctate epithelial erosions, Arcus, Temporal Well healed cataract wounds, trace tear film debris, decreased TBUT   Anterior Chamber Deep and  1-2+ fine cell and pigment Deep and clear   Iris Poorly dilated Round and dilated   Lens PC IOL in good position, Open posterior capsule PC IOL in good position, 1-2+Posterior capsular opacification   Anterior Vitreous Vitreous syneresis, Posterior vitreous detachment, vitreous condensations Vitreous syneresis, Posterior vitreous detachment         Fundus Exam       Right Left   Disc Pink and Sharp Pink and Sharp, mild temporal Peripapillary atrophy, mild PPP   C/D Ratio 0.5 0.4   Macula Flat, Blunted foveal reflex, RPE mottling and clumping, mild atrophy, Drusen, rare MA, trace cystic changes temporal mac --stably improved, no edema Blunted foveal reflex, +CNV with pigment ring -- no surrounding edema -- stably improved, rare MA, +drusen, RPE mottling   Vessels attenuated, mild tortuosity attenuated, Tortuous   Periphery Attached, scattered mild Reticular degeneration, no heme Attached, scattered Reticular degeneration, No heme.           IMAGING AND PROCEDURES  Imaging and Procedures for @TODAY @  OCT, Retina - OU - Both Eyes       Right Eye Quality was good. Central Foveal Thickness: 274. Progression has been stable. Findings include normal foveal contour, no IRF, no SRF, myopic contour, retinal drusen , outer retinal atrophy (Stable improvement in IRF/cystic changes IT macula and fovea, Patchy ORA -- stable, fine vitreous opacities).   Left Eye Quality was good. Progression has been stable. Findings  include no IRF, no SRF, abnormal foveal contour, retinal drusen , subretinal hyper-reflective material, intraretinal hyper-reflective material, pigment epithelial detachment, outer retinal atrophy (Persistent focal PED/CNV with stable improvement in overlying SRHM and retinal edema).   Notes *Images captured and stored on drive  Diagnosis / Impression: Mild DME OU OD: Stable improvement in IRF/cystic changes IT macula and fovea, Patchy ORA -- stable, fine vitreous opacities OS: Exudative ARMD - Persistent focal PED/CNV with stable improvement in overlying SRHM and retinal edema  Clinical management:  See below  Abbreviations: NFP - Normal foveal profile. CME - cystoid macular edema. PED - pigment epithelial detachment. IRF - intraretinal fluid. SRF - subretinal fluid. EZ - ellipsoid zone. ERM - epiretinal membrane. ORA - outer retinal atrophy. ORT - outer retinal tubulation. SRHM - subretinal hyper-reflective material             ASSESSMENT/PLAN:   ICD-10-CM   1. Exudative age-related macular degeneration of left eye with active choroidal neovascularization (HCC)  H35.3221     2. Intermediate stage nonexudative age-related macular degeneration of right eye  H35.3112 OCT, Retina - OU - Both Eyes    3. Both eyes affected by mild nonproliferative diabetic retinopathy with macular edema, associated with type 2 diabetes mellitus (HCC)  Z88.6786     4. 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 Amblyopia of left eye  H53.002     7. Posterior vitreous detachment of left eye  H43.812     8. Pseudophakia of both eyes  Z96.1     9. Bilateral posterior capsular opacification  H26.493     10.  Dry eyes  H04.123     11. 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, OS   - interval conversion to exudative ARMD  noted on 7.26.22 - s/p IVA OS #1 (07.26.22), #2 (08.23.22), #3 (09.21.22), #4 (10.13.22), #5 (11.16.22), #6 (12.14.22), #7 (01.11.23), #8 (02.08.23), #9 (03.22.23), #10 (06.07.23), #11 (08.02.23), #12 (10.25.23), #13 (12.29.23)  - BCVA OS stable at 20/200 (stable and at amblyopic baseline) - OCT shows OS: Exudative ARMD - Persistent focal PED/CNV with stable improvement in overlying SRHM and retinal edema, scattered cystic changes - will hold off on injection OS again today - pt in agreement - f/u see below, DFE, OCT  2. Intermediate Age related macular degeneration, non-exudative, OD -- stable - exam/OCT today shows OD: persistent IRF/cystic changes IT macula and fovea -- slightly improved; Patchy ORA - stable, fine vitreous opacities  - suspect cystic changes mostly related to DM and HTN  - BCVA OD 20/30 stable  - FA 01.04.21 - no CNVM OU -- no exudative disease  - continue amsler grid monitoring   3,4. Mild non-proliferative diabetic retinopathy, both eyes  - A1c: 6.2 on 10.09.24 - s/p IVA OD #1 (01.29.24), #2 (02.26.24), #3 (03.25.24), #4 (04.22.24), #5 (05.28.24) -- IVA resistance - s/p IVE OD #1 (07.09.24), #2 (08.12.24), #3 (09.09.24), #4 (10.07.24), #5 (11.04.24) -- IVE resistance - s/p IVV OD #1 (SAMPLE -- 01.13.25), #2 (SAMPLE -- 02.17.25),  #3 (SAMPLE -- 03.24.25), #4 (sample -- 05.05.25), #5 (06.30.25), #6 (08.25.25) - exam shows rare MA/IRH - FA 01.04.21 - late leaking, perifoveal MA OU; no NV OU - OCT shows OD: Stable improvement in IRF/cystic changes IT macula and fovea, Patchy ORA -- stable, fine vitreous opacities; OS: Exudative ARMD - Persistent focal PED/CNV with stable improvement in overlying SRHM and retinal edema - BCVA OD 20/30 stable  - recommend no inj needed today, seen for increase of floaters OD on 09.18.25 - RBA of procedure discussed, questions answered - IVV informed consent obtained and signed 01.13.25 - approved for [employer]  assistance program   5.  Anterior Uveitis / iridocyclitis OD  - Pt presented on 09.18.25 with blurry VA OD w/ onset of new floaters s/p IVV OD on 08.25.25.  - Exam on showed fine KP and 1-2+ fine cell and pigment in OD begin:   - Pred forte  Q2hrs while awake OD   - Prolensa  QID OD   - cont Cosopt  BID OU   - ATs QID OU  - f/u 1 wk, next Thursday for KP / [facility] check  6. Severe amblyopia OS  - history of patching as a child -- no strabismus surgery  - VA dropped down to 20/300 from 20/250 (baseline) due to conv to exu ARMD on 07.26.2022   7. PVD / vitreous syneresis OS  - Discussed findings and prognosis  - No RT or RD on 360 scleral depressed exam  - Reviewed s/s of RT/RD  - strict return precaution sfor any such RT/RD symptoms   8. Pseudophakia OU  - s/p CE/IOL OU by Roz  - beautiful surgeries, doing well - continue to monitor   9. PCO OU  - pt reports some fogginess of vision - s/p YAG Cap OD (01.15.24) -- good opening -- BCVA initially improved to 20/30 from 20/40 - recommend YAG capsulotomy OS - monitor  10. Dry eyes OU  - persistent PEE  on slit lamp exam.   - continue artificial tears and lubricating ointment PRN ---patient not using  - monitor  11. Ocular Hypertension OU  - IOP 20, 17  - continue Cosopt  BID OU  Ophthalmic Meds Ordered this visit:  Meds ordered this encounter  Medications   prednisoLONE  acetate (PRED FORTE ) 1 % ophthalmic suspension    Sig: Place 1 drop into the right eye every 2 (two) hours while awake.    Dispense:  10 mL    Refill:  3   Bromfenac  Sodium (PROLENSA ) 0.07 % SOLN    Sig: Place 1 drop into the right eye in the morning, at noon, in the evening, and at bedtime.    Dispense:  3 mL    Refill:  4     Return for f/u iridocyclitis, KP check , Dilated Exam, OCT.  There are no Patient Instructions on file for this visit.  This document serves as a record of services personally performed by Redell JUDITHANN Hans, MD, PhD. It was created on their behalf by Almetta Pesa, an ophthalmic technician. The creation of this record is the provider's dictation and/or activities during the visit.    Electronically signed by: Almetta Pesa, OA, 08/08/24  4:15 PM  Redell JUDITHANN Hans, M.D., Ph.D. Diseases & Surgery of the Retina and Vitreous [facility]  I have reviewed the above documentation for accuracy and completeness, and I agree with the above. Redell JUDITHANN Hans, M.D., Ph.D. 08/08/24 4:34 PM   Abbreviations: M myopia (nearsighted); A astigmatism; H hyperopia (farsighted); P presbyopia; Mrx spectacle prescription;  CTL contact lenses; OD right eye; OS left eye; OU both eyes  XT exotropia; ET esotropia; PEK punctate epithelial keratitis; PEE punctate epithelial erosions; DES dry eye syndrome; MGD meibomian gland dysfunction; ATs artificial tears; PFAT's preservative free artificial tears; NSC nuclear sclerotic cataract; PSC posterior subcapsular cataract; ERM epi-retinal membrane; PVD posterior vitreous detachment; RD retinal detachment; DM diabetes mellitus; DR diabetic retinopathy; NPDR non-proliferative diabetic retinopathy; PDR proliferative diabetic retinopathy; CSME clinically significant macular edema; DME diabetic macular edema; dbh dot blot hemorrhages; CWS cotton wool spot; POAG primary open angle glaucoma; C/D cup-to-disc ratio; HVF humphrey visual field; GVF goldmann visual field; OCT optical coherence tomography; IOP intraocular pressure; BRVO Branch retinal vein occlusion; CRVO central retinal vein occlusion; CRAO central retinal artery occlusion; BRAO branch retinal artery occlusion; RT retinal tear; SB scleral buckle; PPV pars plana vitrectomy; VH Vitreous hemorrhage; PRP panretinal laser photocoagulation; IVK intravitreal kenalog; VMT vitreomacular traction; MH Macular hole;  NVD neovascularization of the disc; NVE neovascularization elsewhere; AREDS age related eye disease study; ARMD age related macular degeneration; POAG primary  open angle glaucoma; EBMD epithelial/anterior basement membrane dystrophy; ACIOL anterior chamber intraocular lens; IOL intraocular lens; PCIOL posterior chamber intraocular lens; Phaco/IOL phacoemulsification with intraocular lens placement; PRK photorefractive keratectomy; LASIK laser assisted in situ keratomileusis; HTN hypertension; DM diabetes mellitus; COPD chronic obstructive pulmonary disease

## 2024-08-12 ENCOUNTER — Other Ambulatory Visit (INDEPENDENT_AMBULATORY_CARE_PROVIDER_SITE_OTHER): Payer: Self-pay

## 2024-08-12 MED ORDER — KETOROLAC TROMETHAMINE 0.5 % OP SOLN: 1.0000 [drp] | Freq: Four times a day (QID) | OPHTHALMIC | 3 refills | Status: DC

## 2024-08-13 NOTE — Progress Notes (Signed)
 [facility] - Clinic Note  08/15/2024     CHIEF COMPLAINT Patient presents for Retina Follow Up   HISTORY OF PRESENT ILLNESS: Monica Hamilton is a 83 y.o. female who presents to the clinic today for:   HPI     Retina Follow Up   Patient presents with  Wet AMD.  In both eyes.  This started 1 week ago.  Duration of 1 week.  Since onset it is stable.  I, the attending physician,  performed the HPI with the patient and updated documentation appropriately.        Comments   Retina follow up  1 wk for KP / AC check pt is reporting no vision changes noticed she has floaters in OD denies any flashes pt is using Pred forte  Q2hrs while awake OD Prolensa  QID OD Cosopt  BID OU ATs QID OU        Last edited by Valdemar Rogue, MD on 08/19/2024  9:29 PM.    Pt states VA does seem a little bit better, still has haziness at top of VA.    Referring physician: Self-referral -- wife of [name]  HISTORICAL INFORMATION:  Selected notes from the MEDICAL RECORD NUMBER Self referral Ocular Hx: pseudophakia OU [place] 2019) PMH: DM (on metformin), HTN, arthritis,    CURRENT MEDICATIONS: Current Outpatient Medications (Ophthalmic Drugs)  Medication Sig   Bromfenac  Sodium (PROLENSA ) 0.07 % SOLN Place 1 drop into the right eye in the morning, at noon, in the evening, and at bedtime.   dorzolamide -timolol  (COSOPT ) 2-0.5 % ophthalmic solution Place 1 drop into both eyes 2 (two) times daily.   ketorolac  (ACULAR ) 0.5 % ophthalmic solution Place 1 drop into the right eye 4 (four) times daily.   prednisoLONE  acetate (PRED FORTE ) 1 % ophthalmic suspension Place 1 drop into the right eye every 2 (two) hours while awake.   No current facility-administered medications for this visit. (Ophthalmic Drugs)   Current Outpatient Medications (Other)  Medication Sig   amLODipine  (NORVASC ) 5 MG tablet Take 5 mg by mouth daily.   aspirin 81 MG EC tablet Take 81 mg by mouth daily.     CINNAMON PO Take 1,000 mg by mouth daily.   gabapentin  (NEURONTIN ) 100 MG capsule gabapentin  100 mg capsule  TAKE 1 CAPSULE BY MOUTH ONCE DAILY AT BEDTIME FOR 30 DAYS   gabapentin  (NEURONTIN ) 300 MG capsule Take 600 mg by mouth at bedtime.   HYDROcodone -acetaminophen  (NORCO) 7.5-325 MG tablet Take 1 tablet by mouth every 6 (six) hours as needed for severe pain ((score 7 to 10)).   metFORMIN (GLUCOPHAGE) 1000 MG tablet Take 1,000 mg by mouth 2 (two) times daily.    methocarbamol  (ROBAXIN ) 500 MG tablet Take 1 tablet (500 mg total) by mouth every 6 (six) hours as needed for muscle spasms.   metoprolol  succinate (TOPROL -XL) 50 MG 24 hr tablet Take 50 mg by mouth daily. Take with or immediately following a meal.   MODERNA COVID-19 VACCINE 100 MCG/0.5ML injection    Multiple Vitamins-Minerals (ICAPS AREDS 2) CAPS Take 1 capsule by mouth 2 (two) times daily.    rosuvastatin (CRESTOR) 20 MG tablet Take by mouth.   valsartan -hydrochlorothiazide  (DIOVAN -HCT) 160-25 MG tablet Take 1 tablet by mouth daily.   No current facility-administered medications for this visit. (Other)   REVIEW OF SYSTEMS: ROS   Positive for: Musculoskeletal, Endocrine, Cardiovascular, Eyes Negative for: Constitutional, Gastrointestinal, Neurological, Skin, Genitourinary, HENT, Respiratory, Psychiatric, Allergic/Imm, Heme/Lymph Last edited by Resa,  Delon ORN, COT on 08/15/2024  9:30 AM.       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41, led  to Mitral valve prolapse   Past Surgical History:  Procedure Laterality Date   ABDOMINAL HYSTERECTOMY     APPENDECTOMY     BLADDER SUSPENSION     BREAST BIOPSY Left    BREAST EXCISIONAL BIOPSY Left    CATARACT EXTRACTION     CHOLECYSTECTOMY     DILATION AND CURETTAGE OF UTERUS     EYE SURGERY     HEAD & NECK SKIN LESION EXCISIONAL BIOPSY     top left scalp   LUMBAR LAMINECTOMY/DECOMPRESSION MICRODISCECTOMY N/A 08/21/2019   Procedure: Lumbar decompression L3-4 right, microdiscectomy L3-4 right;  Surgeon: Heide Ingle, MD;  Location: [facility];  Service: Orthopedics;  Laterality: N/A;    SHOULDER ARTHROSCOPY WITH SUBACROMIAL DECOMPRESSION Right 01/30/2013   Procedure: SHOULDER ARTHROSCOPY WITH SUBACROMIAL DECOMPRESSION;  Surgeon: Lynwood SHAUNNA Bern, MD;  Location: [facility];  Service: Orthopedics;  Laterality: Right;  with labral debridement   FAMILY HISTORY Family History  Problem Relation Age of Onset   Heart disease Father    Breast cancer Maternal Aunt    Diabetes Brother    Leukemia Sister    Colon cancer Neg Hx    Esophageal cancer Neg Hx    Rectal cancer Neg Hx    Stomach cancer Neg Hx    SOCIAL HISTORY Social History   Tobacco Use   Smoking status: Never   Smokeless tobacco: Never  Vaping Use   Vaping status: Never Used  Substance Use Topics   Alcohol  use: No   Drug use: No       OPHTHALMIC EXAM: Base Eye Exam     Visual Acuity (Snellen - Linear)       Right Left   Dist [place] 20/40 -1 20/350 -1   Dist ph [place] 20/300 -1         Tonometry (Tonopen, 9:35 AM)       Right Left   Pressure 18 18         Pupils       Pupils Dark Light Shape React APD   Right PERRL 3 2 Round Brisk None   Left PERRL 3 2 Round Brisk None         Visual Fields       Left Right    Full Full         Extraocular Movement       Right Left    Full, Ortho Full, Ortho         Neuro/Psych     Oriented x3: Yes   Mood/Affect: Normal         Dilation      Both eyes: 2.5% Phenylephrine  @ 9:35 AM           Slit Lamp and Fundus Exam  Slit Lamp Exam       Right Left   Lids/Lashes Dermatochalasis - upper lid, mild MGD Dermatochalasis - upper lid, Meibomian gland dysfunction   Conjunctiva/Sclera nasal and temporal pinguecula, white and quiet Nasal Pinguecula   Cornea Arcus, 1+ PEE, fine KP--slightly improved, mild guttata, well healed cataract wound 3+ Punctate epithelial erosions, Arcus, Temporal Well healed cataract wounds, trace tear film debris, decreased TBUT   Anterior Chamber Deep and  0.5+ fine cell and pigment Deep and clear   Iris Poorly dilated Round and dilated   Lens PC IOL in good position, Open posterior capsule PC IOL in good position, 1-2+Posterior capsular opacification   Anterior Vitreous Vitreous syneresis, Posterior vitreous detachment, vitreous condensations Vitreous syneresis, Posterior vitreous detachment         Fundus Exam       Right Left   Disc Pink and Sharp Pink and Sharp, mild temporal Peripapillary atrophy, mild PPP   C/D Ratio 0.5 0.4   Macula Flat, Blunted foveal reflex, RPE mottling and clumping, mild atrophy, Drusen, rare MA, trace cystic changes temporal mac --stably improved, no edema Blunted foveal reflex, +CNV with pigment ring -- no surrounding edema -- stably improved, rare MA, +drusen, RPE mottling   Vessels attenuated, mild tortuosity attenuated, Tortuous   Periphery Attached, scattered mild Reticular degeneration, no heme Attached, scattered Reticular degeneration, No heme.           IMAGING AND PROCEDURES  Imaging and Procedures for @TODAY @  OCT, Retina - OU - Both Eyes       Right Eye Quality was good. Central Foveal Thickness: 270. Progression has been stable. Findings include normal foveal contour, no IRF, no SRF, myopic contour, retinal drusen , outer retinal atrophy (Stable improvement in IRF/cystic changes IT macula and fovea, Patchy ORA -- stable, fine vitreous  opacities).   Left Eye Quality was good. Central Foveal Thickness: 270. Progression has been stable. Findings include no IRF, no SRF, abnormal foveal contour, retinal drusen , subretinal hyper-reflective material, intraretinal hyper-reflective material, pigment epithelial detachment, outer retinal atrophy (Persistent focal PED/CNV with stable improvement in overlying SRHM and retinal edema).   Notes *Images captured and stored on drive  Diagnosis / Impression: Mild DME OU OD: Stable improvement in IRF/cystic changes IT macula and fovea, Patchy ORA -- stable, fine vitreous opacities OS: Exudative ARMD - Persistent focal PED/CNV with stable improvement in overlying SRHM and retinal edema  Clinical management:  See below  Abbreviations: NFP - Normal foveal profile. CME - cystoid macular edema. PED - pigment epithelial detachment. IRF - intraretinal fluid. SRF - subretinal fluid. EZ - ellipsoid zone. ERM - epiretinal membrane. ORA - outer retinal atrophy. ORT - outer retinal tubulation. SRHM - subretinal hyper-reflective material              ASSESSMENT/PLAN:   ICD-10-CM   1. Exudative age-related macular degeneration of left eye with active choroidal neovascularization (HCC)  H35.3221 OCT, Retina - OU - Both Eyes    2. Intermediate stage nonexudative age-related macular degeneration of right eye  H35.3112     3. Both eyes affected by mild nonproliferative diabetic retinopathy with macular edema, associated with type 2 diabetes mellitus (HCC)  Z88.6786     4. 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 Amblyopia of left eye  H53.002     7. Posterior vitreous detachment of left eye  H43.812  8. Pseudophakia of both eyes  Z96.1     9. Bilateral posterior capsular opacification  H26.493     10. Dry eyes  H04.123      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, OS   - interval conversion to exudative ARMD noted on  7.26.22 - s/p IVA OS #1 (07.26.22), #2 (08.23.22), #3 (09.21.22), #4 (10.13.22), #5 (11.16.22), #6 (12.14.22), #7 (01.11.23), #8 (02.08.23), #9 (03.22.23), #10 (06.07.23), #11 (08.02.23), #12 (10.25.23), #13 (12.29.23)  - BCVA OS stable at 20/200 (stable and at amblyopic baseline) - OCT shows OS: Exudative ARMD - Persistent focal PED/CNV with stable improvement in overlying SRHM and retinal edema, scattered cystic changes - will hold off on injection OS again today - pt in agreement - f/u see below, DFE, OCT  2. Intermediate Age related macular degeneration, non-exudative, OD -- stable - exam/OCT today shows OD: persistent IRF/cystic changes IT macula and fovea -- slightly improved; Patchy ORA - stable, fine vitreous opacities  - suspect cystic changes mostly related to DM and HTN  - BCVA OD 20/30 stable  - FA 01.04.21 - no CNVM OU -- no exudative disease  - continue [place]msler grid monitoring   3,4. Mild non-proliferative diabetic retinopathy, both eyes  - A1c: 6.2 on 10.09.24 - s/p IVA OD #1 (01.29.24), #2 (02.26.24), #3 (03.25.24), #4 (04.22.24), #5 (05.28.24) -- IVA resistance - s/p IVE OD #1 (07.09.24), #2 (08.12.24), #3 (09.09.24), #4 (10.07.24), #5 (11.04.24) -- IVE resistance - s/p IVV OD #1 (SAMPLE -- 01.13.25), #2 (SAMPLE -- 02.17.25),  #3 (SAMPLE -- 03.24.25), #4 (sample -- 05.05.25), #5 (06.30.25), #6 (08.25.25) - exam shows rare MA/IRH - FA 01.04.21 - late leaking, perifoveal MA OU; no NV OU - OCT shows OD: Stable improvement in IRF/cystic changes IT macula and fovea, Patchy ORA -- stable, fine vitreous opacities; OS: Exudative ARMD - Persistent focal PED/CNV with stable improvement in overlying SRHM and retinal edema - BCVA OD 20/40 down from 20/30  - recommend no inj needed today, seen for increase of floaters OD on 09.18.25 - RBA of procedure discussed, questions answered - IVV informed consent obtained and signed 01.13.25 - approved for vabysmo  assistance program  5.  Anterior Uveitis / iridocyclitis OD  - Pt presented on 09.18.25 with blurry VA OD w/ onset of new floaters s/p IVV OD on 08.25.25.  - Exam on showed fine KP and 1-2+ fine cell and pigment in OD -- started:   - Pred forte  Q2hrs while awake OD--decrease to six times daily OD (approx every 3 hrs)   - Ketorolac  QID OD   - cont Cosopt  BID OU   - ATs QID OU  - KPs and AC cell improved today  - f/u as scheduled on 11.3.25 for likely inj  6. Severe amblyopia OS  - history of patching as a child -- no strabismus surgery  - VA dropped down to 20/300 from 20/250 (baseline) due to conv to exu ARMD on 07.26.2022  7. PVD / vitreous syneresis OS  - Discussed findings and prognosis  - No RT or RD on 360 scleral depressed exam  - Reviewed s/s of RT/RD  - strict return precautions for any such RT/RD symptoms   8. Pseudophakia OU  - s/p CE/IOL OU by Roz  - beautiful surgeries, doing well - continue to monitor   9. PCO OU  - pt reports some fogginess of vision - s/p YAG Cap OD (01.15.24) -- good opening -- BCVA initially improved to 20/30 from 20/40 -  recommend YAG capsulotomy OS - monitor  10. Dry eyes OU  - persistent PEE on slit lamp exam.   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    Return for as scheduled on 11.3.25, Dilated Exam, OCT, Possible Injxn.  There are no Patient Instructions on file for this visit.  This document serves as a record of services personally performed by Redell JUDITHANN Hans, MD, PhD. It was created on their behalf by Auston Muzzy, COMT. The creation of this record is the provider's dictation and/or activities during the visit.  Electronically signed by: Auston Muzzy, COMT 08/19/24 9:30 PM  Redell JUDITHANN Hans, M.D., Ph.D. Diseases & Surgery of  the Retina and Vitreous [facility]  I have reviewed the above documentation for accuracy and completeness, and I agree with the above. Redell JUDITHANN Hans, M.D., Ph.D. 08/19/24 9:32 PM   Abbreviations: M myopia (nearsighted); A astigmatism; H hyperopia (farsighted); P presbyopia; Mrx spectacle prescription;  CTL contact lenses; OD right eye; OS left eye; OU both eyes  XT exotropia; ET esotropia; PEK punctate epithelial keratitis; PEE punctate epithelial erosions; DES dry eye syndrome; MGD meibomian gland dysfunction; ATs artificial tears; PFAT's preservative free artificial tears; NSC nuclear sclerotic cataract; PSC posterior subcapsular cataract; ERM epi-retinal membrane; PVD posterior vitreous detachment; RD retinal detachment; DM diabetes mellitus; DR diabetic retinopathy; NPDR non-proliferative diabetic retinopathy; PDR proliferative diabetic retinopathy; CSME clinically significant macular edema; DME diabetic macular edema; dbh dot blot hemorrhages; CWS cotton wool spot; POAG primary open angle glaucoma; C/D cup-to-disc ratio; HVF humphrey visual field; GVF goldmann visual field; OCT optical coherence tomography; IOP intraocular pressure; BRVO Branch retinal vein occlusion; CRVO central retinal vein occlusion; CRAO central retinal artery occlusion; BRAO branch retinal artery occlusion; RT retinal tear; SB scleral buckle; PPV pars plana vitrectomy; VH Vitreous hemorrhage; PRP panretinal laser photocoagulation; IVK intravitreal kenalog; VMT vitreomacular traction; MH Macular hole;  NVD neovascularization of the disc; NVE neovascularization elsewhere; AREDS age related eye disease study; ARMD age related macular degeneration; POAG primary open angle glaucoma; EBMD epithelial/anterior basement membrane dystrophy; ACIOL anterior chamber intraocular lens; IOL intraocular lens; PCIOL posterior chamber intraocular lens; Phaco/IOL phacoemulsification with intraocular lens placement; PRK  photorefractive keratectomy; LASIK laser assisted in situ keratomileusis; HTN hypertension; DM diabetes mellitus; COPD chronic obstructive pulmonary disease

## 2024-08-15 ENCOUNTER — Ambulatory Visit (INDEPENDENT_AMBULATORY_CARE_PROVIDER_SITE_OTHER): Admitting: Ophthalmology

## 2024-08-15 ENCOUNTER — Encounter (INDEPENDENT_AMBULATORY_CARE_PROVIDER_SITE_OTHER): Payer: Self-pay | Admitting: Ophthalmology

## 2024-08-15 VITALS — BP 156/79 | HR 47

## 2024-08-15 DIAGNOSIS — H04123 Dry eye syndrome of bilateral lacrimal glands: Secondary | ICD-10-CM

## 2024-08-15 DIAGNOSIS — H353112 Nonexudative age-related macular degeneration, right eye, intermediate dry stage: Secondary | ICD-10-CM | POA: Diagnosis not present

## 2024-08-15 DIAGNOSIS — H353221 Exudative age-related macular degeneration, left eye, with active choroidal neovascularization: Secondary | ICD-10-CM | POA: Diagnosis not present

## 2024-08-15 DIAGNOSIS — H43812 Vitreous degeneration, left eye: Secondary | ICD-10-CM

## 2024-08-15 DIAGNOSIS — Z7984 Long term (current) use of oral hypoglycemic drugs: Secondary | ICD-10-CM

## 2024-08-15 DIAGNOSIS — E113213 Type 2 diabetes mellitus with mild nonproliferative diabetic retinopathy with macular edema, bilateral: Secondary | ICD-10-CM

## 2024-08-15 DIAGNOSIS — Z961 Presence of intraocular lens: Secondary | ICD-10-CM | POA: Diagnosis not present

## 2024-08-15 DIAGNOSIS — H209 Unspecified iridocyclitis: Secondary | ICD-10-CM

## 2024-08-15 DIAGNOSIS — H26493 Other secondary cataract, bilateral: Secondary | ICD-10-CM | POA: Diagnosis not present

## 2024-08-15 DIAGNOSIS — H53002 Unspecified amblyopia, left eye: Secondary | ICD-10-CM

## 2024-08-19 ENCOUNTER — Encounter (INDEPENDENT_AMBULATORY_CARE_PROVIDER_SITE_OTHER): Payer: Self-pay | Admitting: Ophthalmology

## 2024-09-09 NOTE — Progress Notes (Signed)
 [facility] - Clinic Note  09/23/2024     CHIEF COMPLAINT Patient presents for Retina Follow Up   HISTORY OF PRESENT ILLNESS: Monica Hamilton is a 83 y.o. female who presents to the clinic today for:   HPI     Retina Follow Up   Patient presents with  Wet AMD.  In left eye.  Severity is moderate.  Since onset it is stable.  I, the attending physician,  performed the HPI with the patient and updated documentation appropriately.        Comments   10 week Retina eval Patient states vision seems the same      Last edited by Valdemar Rogue, MD on 09/29/2024  4:10 PM.      Patient states the floaters in the upper right of her vision is fading.   Referring physician: Self-referral -- wife of Christyl Osentoski  HISTORICAL INFORMATION:  Selected notes from the MEDICAL RECORD NUMBER Self referral Ocular Hx: pseudophakia OU [place] 2019) PMH: DM (on metformin), HTN, arthritis,    CURRENT MEDICATIONS: Current Outpatient Medications (Ophthalmic Drugs)  Medication Sig   Bromfenac  Sodium (PROLENSA ) 0.07 % SOLN Place 1 drop into the right eye in the morning, at noon, in the evening, and at bedtime.   dorzolamide -timolol  (COSOPT ) 2-0.5 % ophthalmic solution Place 1 drop into both eyes 2 (two) times daily.   ketorolac  (ACULAR ) 0.5 % ophthalmic solution Place 1 drop into the right eye 4 (four) times daily.   prednisoLONE  acetate (PRED FORTE ) 1 % ophthalmic suspension Place 1 drop into the right eye every 2 (two) hours while awake.   No current facility-administered medications for this visit. (Ophthalmic Drugs)   Current Outpatient Medications (Other)  Medication Sig   amLODipine  (NORVASC ) 5 MG tablet Take 5 mg by mouth daily.   aspirin 81 MG EC tablet Take 81 mg by mouth daily.    CINNAMON PO Take 1,000 mg by mouth daily.   gabapentin  (NEURONTIN ) 100 MG capsule gabapentin  100 mg capsule  TAKE 1 CAPSULE BY MOUTH ONCE DAILY AT BEDTIME FOR 30 DAYS   gabapentin   (NEURONTIN ) 300 MG capsule Take 600 mg by mouth at bedtime.   HYDROcodone -acetaminophen  (NORCO) 7.5-325 MG tablet Take 1 tablet by mouth every 6 (six) hours as needed for severe pain ((score 7 to 10)).   metFORMIN (GLUCOPHAGE) 1000 MG tablet Take 1,000 mg by mouth 2 (two) times daily.    methocarbamol  (ROBAXIN ) 500 MG tablet Take 1 tablet (500 mg total) by mouth every 6 (six) hours as needed for muscle spasms.   metoprolol  succinate (TOPROL -XL) 50 MG 24 hr tablet Take 50 mg by mouth daily. Take with or immediately following a meal.   MODERNA COVID-19 VACCINE 100 MCG/0.5ML injection    Multiple Vitamins-Minerals (ICAPS AREDS 2) CAPS Take 1 capsule by mouth 2 (two) times daily.    rosuvastatin (CRESTOR) 20 MG tablet Take by mouth.   valsartan -hydrochlorothiazide  (DIOVAN -HCT) 160-25 MG tablet Take 1 tablet by mouth daily.   No current facility-administered medications for this visit. (Other)   REVIEW OF SYSTEMS: ROS   Positive for: Musculoskeletal, Endocrine, Cardiovascular, Eyes Negative for: Constitutional, Gastrointestinal, Neurological, Skin, Genitourinary, HENT, Respiratory, Psychiatric, Allergic/Imm, Heme/Lymph Last edited by German Olam BRAVO, COT on 09/23/2024 12:39 PM.        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    Age 60, led to Mitral valve prolapse   Past Surgical History:  Procedure Laterality Date   ABDOMINAL HYSTERECTOMY     APPENDECTOMY     BLADDER SUSPENSION     BREAST BIOPSY Left    BREAST  EXCISIONAL BIOPSY Left    CATARACT EXTRACTION     CHOLECYSTECTOMY     DILATION AND CURETTAGE OF UTERUS     EYE SURGERY     HEAD & NECK SKIN LESION EXCISIONAL BIOPSY     top left scalp   LUMBAR LAMINECTOMY/DECOMPRESSION MICRODISCECTOMY N/A 08/21/2019   Procedure: Lumbar decompression L3-4 right, microdiscectomy L3-4 right;  Surgeon: Heide Ingle, MD;  Location: [facility];  Service: Orthopedics;  Laterality: N/A;    SHOULDER ARTHROSCOPY WITH SUBACROMIAL DECOMPRESSION Right 01/30/2013   Procedure: SHOULDER ARTHROSCOPY WITH SUBACROMIAL DECOMPRESSION;  Surgeon: Lynwood SHAUNNA Bern, MD;  Location: [facility];  Service: Orthopedics;  Laterality: Right;  with labral debridement   FAMILY HISTORY Family History  Problem Relation Age of Onset   Heart disease Father    Breast cancer Maternal Aunt    Diabetes Brother    Leukemia Sister    Colon cancer Neg Hx    Esophageal cancer Neg Hx    Rectal cancer Neg Hx    Stomach cancer Neg Hx    SOCIAL HISTORY Social History   Tobacco Use   Smoking status: Never   Smokeless tobacco: Never  Vaping Use   Vaping status: Never Used  Substance Use Topics   Alcohol  use: No   Drug use: No       OPHTHALMIC EXAM: Base Eye Exam     Visual Acuity (Snellen - Linear)       Right Left   Dist [place] 20/40 20/200 -2   Dist ph [place] 20/NI 20/NI         Tonometry (Tonopen, 12:49 PM)       Right Left   Pressure 22 18         Pupils       Dark Light Shape React APD   Right 3 2 Round Brisk None   Left 3 2 Round Brisk None         Visual Fields (Counting fingers)       Left Right    Full Full         Extraocular Movement       Right Left    Full, Ortho Full, Ortho         Neuro/Psych     Oriented x3: Yes   Mood/Affect: Normal         Dilation     Both eyes: 1.0% Mydriacyl, 2.5% Phenylephrine  @ 12:49 PM           Slit Lamp and Fundus Exam     Slit Lamp Exam       Right Left   Lids/Lashes Dermatochalasis - upper  lid, mild MGD Dermatochalasis - upper lid, Meibomian gland dysfunction   Conjunctiva/Sclera nasal and temporal pinguecula, white and quiet Nasal Pinguecula   Cornea Arcus,  trace PEE, fine KP--resolved, mild guttata, well healed cataract wound 3+ Punctate epithelial erosions, Arcus, Temporal Well healed cataract wounds, trace tear film debris, decreased TBUT   Anterior Chamber Deep and  0.5+ fine cell and pigment Deep and clear   Iris Round and dilated Round and dilated   Lens PC IOL in good position, Open posterior capsule PC IOL in good position, 1-2+Posterior capsular opacification   Anterior Vitreous Vitreous syneresis, Posterior vitreous detachment, vitreous condensations, fine cell Vitreous syneresis, Posterior vitreous detachment         Fundus Exam       Right Left   Disc Pink and Sharp Pink and Sharp, mild temporal Peripapillary atrophy, mild PPP   C/D Ratio 0.5 0.4   Macula Flat, Blunted foveal reflex, RPE mottling and clumping, mild atrophy, Drusen, rare MA, trace cystic changes temporal mac --stably improved, no edema Blunted foveal reflex, +CNV with pigment ring -- no surrounding edema -- stably improved, rare MA, +drusen, RPE mottling   Vessels attenuated, mild tortuosity attenuated, Tortuous   Periphery Attached, scattered mild Reticular degeneration, no heme Attached, scattered Reticular degeneration, No heme.           IMAGING AND PROCEDURES  Imaging and Procedures for @TODAY @  OCT, Retina - OU - Both Eyes       Right Eye Quality was good. Central Foveal Thickness: 271. Progression has improved. Findings include normal foveal contour, no IRF, no SRF, myopic contour, retinal drusen , outer retinal atrophy (Stable improvement in IRF/cystic changes IT macula and fovea, Patchy ORA -- stable, fine vitreous opacities- improved).   Left Eye Quality was good. Central Foveal Thickness: 266. Progression has been stable. Findings include no IRF, no SRF, abnormal foveal  contour, retinal drusen , subretinal hyper-reflective material, intraretinal hyper-reflective material, pigment epithelial detachment, outer retinal atrophy (Persistent focal PED/CNV with stable improvement in overlying SRHM and retinal edema).   Notes *Images captured and stored on drive  Diagnosis / Impression: Mild DME OU OD: Stable improvement in IRF/cystic changes IT macula and fovea, Patchy ORA -- stable, fine vitreous opacities- improved OS: Exudative ARMD - Persistent focal PED/CNV with stable improvement in overlying SRHM and retinal edema  Clinical management:  See below  Abbreviations: NFP - Normal foveal profile. CME - cystoid macular edema. PED - pigment epithelial detachment. IRF - intraretinal fluid. SRF - subretinal fluid. EZ - ellipsoid zone. ERM - epiretinal membrane. ORA - outer retinal atrophy. ORT - outer retinal tubulation. SRHM - subretinal hyper-reflective material               ASSESSMENT/PLAN:   ICD-10-CM   1. Exudative age-related macular degeneration of left eye with active choroidal neovascularization (HCC)  H35.3221 OCT, Retina - OU - Both Eyes    CANCELED: Intravitreal Injection, Pharmacologic Agent - OD - Right Eye    2. Intermediate stage nonexudative age-related macular degeneration of right eye  H35.3112     3. Both eyes affected by mild nonproliferative diabetic retinopathy with macular edema, associated with type 2 diabetes mellitus (HCC)  Z88.6786     4. 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 Amblyopia of left eye  H53.002     7. Posterior vitreous detachment of left eye  H43.812     8. Pseudophakia of both eyes  Z96.1     9. Bilateral posterior capsular opacification  H26.493     10. Dry eyes  Y95.876     11. 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, OS   - interval conversion to exudative ARMD noted on 7.26.22 - s/p IVA OS  #1 (07.26.22), #2 (08.23.22), #3 (09.21.22), #4 (10.13.22), #5 (11.16.22), #6 (12.14.22), #7 (01.11.23), #8 (02.08.23), #9 (03.22.23), #10 (06.07.23), #11 (08.02.23), #12 (10.25.23), #13 (12.29.23)  - BCVA OS stable at 20/200 (stable and at amblyopic baseline) - OCT shows OS: Exudative ARMD - Persistent focal PED/CNV with stable improvement in overlying SRHM and retinal edema, scattered cystic changes - will hold off on injection OS again today - pt in agreement - f/u see below, DFE, OCT  2. Intermediate Age related macular degeneration, non-exudative, OD -- stable - exam/OCT today shows OD: persistent IRF/cystic changes IT macula and fovea -- slightly improved; Patchy ORA - stable, fine vitreous opacities  - suspect cystic changes mostly related to DM and HTN  - BCVA OD 20/30 stable  - FA 01.04.21 - no CNVM OU -- no exudative disease  - continue Amsler grid monitoring   3,4. Mild non-proliferative diabetic retinopathy, both eyes  - A1c: 6.2 on 10.09.24 - s/p IVA OD #1 (01.29.24), #2 (02.26.24), #3 (03.25.24), #4 (04.22.24), #5 (05.28.24) -- IVA resistance ================ - s/p IVE OD #1 (07.09.24), #2 (08.12.24), #3 (09.09.24), #4 (10.07.24), #5 (11.04.24) -- IVE resistance ================ - s/p IVV OD #1 (SAMPLE -- 01.13.25), #2 (SAMPLE -- 02.17.25),  #3 (SAMPLE -- 03.24.25), #4 (sample -- 05.05.25), #5 (06.30.25 sample), #6 (08.25.25 PAP) - exam shows rare MA/IRH - FA 01.04.21 - late leaking, perifoveal MA OU; no NV OU - OCT shows OD: Stable improvement in IRF/cystic changes IT macula and fovea, Patchy ORA -- stable, fine vitreous opacities; OS: Exudative ARMD - Persistent focal PED/CNV with stable improvement in overlying SRHM and retinal edema at 10 weeks - BCVA OD 20/40 - stable - recommend holding IVV today OD (11.04.2025) with f/u as scheduled (q10 wks from 08.25.25) - RBA of procedure discussed, questions answered - IVV informed consent obtained and signed 01.13.25 - approved  for vabysmo  assistance program - f/u 12.1.25 as scheduled for 10 weeks DFE, OCT, possible injection  5. Anterior Uveitis / iridocyclitis OD - Pt presented on 09.18.25 with blurry VA OD w/ onset of new floaters s/p IVV OD on 08.25.25. - Exam on showed fine KP and 1-2+ fine cell and pigment in OD -- started: - Pred forte  QID   - Ketorolac  QID OD   - cont Cosopt  BID OU   - ATs QID OU  - KPs and AC cell improved today  - f/u as 1 month DFE, OCT, possible injection  6. Severe amblyopia OS  - history of patching as a child -- no strabismus surgery - VA dropped down to 20/300 from 20/250 (baseline) due to conv to exu ARMD on 07.26.2022  7. PVD / vitreous syneresis OS  - Discussed findings and prognosis  - No RT or RD on 360 scleral depressed exam  - Reviewed s/s of RT/RD  - strict return precautions for any such RT/RD symptoms   8. Pseudophakia OU  - s/p CE/IOL OU by [place]oz  - beautiful surgeries, doing well - continue to monitor   9. PCO OU  - pt reports some fogginess of vision - s/p YAG Cap OD (01.15.24) -- good opening -- BCVA initially improved to 20/30 from 20/40 - recommend YAG capsulotomy OS - monitor  10. Dry eyes OU  - persistent PEE  on slit lamp exam.  - continue artificial tears and lubricating ointment PRN ---patient not using  - monitor  11. Ocular Hypertension OU  - IOP 22, 18  - continue cosopt  BID OU  Ophthalmic Meds Ordered this visit:  No orders of the defined types were placed in this encounter.    Return for as scheduled.  There are no Patient Instructions on file for this visit.  This document serves as a record of services personally performed by Redell JUDITHANN Hans, MD, PhD. It was created on their behalf by Avelina Pereyra, COA an ophthalmic technician. The creation of this record is the provider's dictation and/or activities during the visit.   Electronically signed by: Avelina GORMAN Pereyra, COT  09/29/24  4:17 PM   This document serves as a record of  services personally performed by Redell JUDITHANN Hans, MD, PhD. It was created on their behalf by Wanda GEANNIE Keens, COT an ophthalmic technician. The creation of this record is the provider's dictation and/or activities during the visit.    Electronically signed by:  Wanda GEANNIE Keens, COT  09/29/24 4:17 PM  Redell JUDITHANN Hans, M.D., Ph.D. Diseases & Surgery of the Retina and Vitreous [facility] 09/23/2024  I have reviewed the above documentation for accuracy and completeness, and I agree with the above. Redell JUDITHANN Hans, M.D., Ph.D. 09/29/24 4:18 PM   Abbreviations: M myopia (nearsighted); A astigmatism; H hyperopia (farsighted); P presbyopia; Mrx spectacle prescription;  CTL contact lenses; OD right eye; OS left eye; OU both eyes  XT exotropia; ET esotropia; PEK punctate epithelial keratitis; PEE punctate epithelial erosions; DES dry eye syndrome; MGD meibomian gland dysfunction; ATs artificial tears; PFAT's preservative free artificial tears; NSC nuclear sclerotic cataract; PSC posterior subcapsular cataract; ERM epi-retinal membrane; PVD posterior vitreous detachment; RD retinal detachment; DM diabetes mellitus; DR diabetic retinopathy; NPDR non-proliferative diabetic retinopathy; PDR proliferative diabetic retinopathy; CSME clinically significant macular edema; DME diabetic macular edema; dbh dot blot hemorrhages; CWS cotton wool spot; POAG primary open angle glaucoma; C/D cup-to-disc ratio; HVF humphrey visual field; GVF goldmann visual field; OCT optical coherence tomography; IOP intraocular pressure; BRVO Branch retinal vein occlusion; CRVO central retinal vein occlusion; CRAO central retinal artery occlusion; BRAO branch retinal artery occlusion; RT retinal tear; SB scleral buckle; PPV pars plana vitrectomy; VH Vitreous hemorrhage; PRP panretinal laser photocoagulation; IVK intravitreal kenalog; VMT vitreomacular traction; MH Macular hole;  NVD neovascularization of the disc;  NVE neovascularization elsewhere; AREDS age related eye disease study; ARMD age related macular degeneration; POAG primary open angle glaucoma; EBMD epithelial/anterior basement membrane dystrophy; ACIOL anterior chamber intraocular lens; IOL intraocular lens; PCIOL posterior chamber intraocular lens; Phaco/IOL phacoemulsification with intraocular lens placement; PRK photorefractive keratectomy; LASIK laser assisted in situ keratomileusis; HTN hypertension; DM diabetes mellitus; COPD chronic obstructive pulmonary disease

## 2024-09-23 ENCOUNTER — Ambulatory Visit (INDEPENDENT_AMBULATORY_CARE_PROVIDER_SITE_OTHER): Admitting: Ophthalmology

## 2024-09-23 ENCOUNTER — Encounter (INDEPENDENT_AMBULATORY_CARE_PROVIDER_SITE_OTHER): Payer: Self-pay | Admitting: Ophthalmology

## 2024-09-23 DIAGNOSIS — E113213 Type 2 diabetes mellitus with mild nonproliferative diabetic retinopathy with macular edema, bilateral: Secondary | ICD-10-CM | POA: Diagnosis not present

## 2024-09-23 DIAGNOSIS — H43812 Vitreous degeneration, left eye: Secondary | ICD-10-CM

## 2024-09-23 DIAGNOSIS — H26493 Other secondary cataract, bilateral: Secondary | ICD-10-CM

## 2024-09-23 DIAGNOSIS — H353221 Exudative age-related macular degeneration, left eye, with active choroidal neovascularization: Secondary | ICD-10-CM | POA: Diagnosis not present

## 2024-09-23 DIAGNOSIS — Z7984 Long term (current) use of oral hypoglycemic drugs: Secondary | ICD-10-CM | POA: Diagnosis not present

## 2024-09-23 DIAGNOSIS — H353112 Nonexudative age-related macular degeneration, right eye, intermediate dry stage: Secondary | ICD-10-CM

## 2024-09-23 DIAGNOSIS — H53002 Unspecified amblyopia, left eye: Secondary | ICD-10-CM

## 2024-09-23 DIAGNOSIS — H209 Unspecified iridocyclitis: Secondary | ICD-10-CM

## 2024-09-23 DIAGNOSIS — H04123 Dry eye syndrome of bilateral lacrimal glands: Secondary | ICD-10-CM

## 2024-09-23 DIAGNOSIS — Z961 Presence of intraocular lens: Secondary | ICD-10-CM

## 2024-09-23 DIAGNOSIS — H40053 Ocular hypertension, bilateral: Secondary | ICD-10-CM

## 2024-09-29 ENCOUNTER — Encounter (INDEPENDENT_AMBULATORY_CARE_PROVIDER_SITE_OTHER): Payer: Self-pay | Admitting: Ophthalmology

## 2024-10-07 NOTE — Progress Notes (Signed)
 [facility] - Clinic Note  10/21/2024     CHIEF COMPLAINT Patient presents for Retina Follow Up   HISTORY OF PRESENT ILLNESS: Monica Hamilton is a 83 y.o. female who presents to the clinic today for:   HPI     Retina Follow Up   Patient presents with  Wet AMD.  In left eye.  This started 6 years ago.  Severity is moderate.  Duration of 4 weeks.  Since onset it is stable.  I, the attending physician,  performed the HPI with the patient and updated documentation appropriately.        Comments   Pt denies any changes in vision/no FOL/floaters/pain. Pt does not use ATS. Pt does not monitor amsler grid. Pt has discontinued PF/Ketorolac /Cosopt . BS=not monitored daily. A1c=no recent test.      Last edited by Valdemar Rogue, MD on 10/21/2024  6:22 PM.    Patient states vision has cleared, she stopped drops because she 'figured she didn't need them'. Pt d/c drops for 3 weeks at least.   Referring physician: Self-referral -- wife of Bevan Vu  HISTORICAL INFORMATION:  Selected notes from the MEDICAL RECORD NUMBER Self referral Ocular Hx: pseudophakia OU [place] 2019) PMH: DM (on metformin), HTN, arthritis,    CURRENT MEDICATIONS: Current Outpatient Medications (Ophthalmic Drugs)  Medication Sig   Bromfenac  Sodium (PROLENSA ) 0.07 % SOLN Place 1 drop into the right eye in the morning, at noon, in the evening, and at bedtime.   dorzolamide -timolol  (COSOPT ) 2-0.5 % ophthalmic solution Place 1 drop into both eyes 2 (two) times daily.   ketorolac  (ACULAR ) 0.5 % ophthalmic solution Place 1 drop into the right eye in the morning and at bedtime.   prednisoLONE  acetate (PRED FORTE ) 1 % ophthalmic suspension Place 1 drop into the right eye in the morning and at bedtime.   No current facility-administered medications for this visit. (Ophthalmic Drugs)   Current Outpatient Medications (Other)  Medication Sig   amLODipine  (NORVASC ) 5 MG tablet Take 5 mg by mouth  daily.   aspirin 81 MG EC tablet Take 81 mg by mouth daily.    CINNAMON PO Take 1,000 mg by mouth daily.   gabapentin  (NEURONTIN ) 100 MG capsule gabapentin  100 mg capsule  TAKE 1 CAPSULE BY MOUTH ONCE DAILY AT BEDTIME FOR 30 DAYS   gabapentin  (NEURONTIN ) 300 MG capsule Take 600 mg by mouth at bedtime.   HYDROcodone -acetaminophen  (NORCO) 7.5-325 MG tablet Take 1 tablet by mouth every 6 (six) hours as needed for severe pain ((score 7 to 10)).   metFORMIN (GLUCOPHAGE) 1000 MG tablet Take 1,000 mg by mouth 2 (two) times daily.    methocarbamol  (ROBAXIN ) 500 MG tablet Take 1 tablet (500 mg total) by mouth every 6 (six) hours as needed for muscle spasms.   metoprolol  succinate (TOPROL -XL) 50 MG 24 hr tablet Take 50 mg by mouth daily. Take with or immediately following a meal.   MODERNA COVID-19 VACCINE 100 MCG/0.5ML injection    Multiple Vitamins-Minerals (ICAPS AREDS 2) CAPS Take 1 capsule by mouth 2 (two) times daily.    rosuvastatin (CRESTOR) 20 MG tablet Take by mouth.   valsartan -hydrochlorothiazide  (DIOVAN -HCT) 160-25 MG tablet Take 1 tablet by mouth daily.   No current facility-administered medications for this visit. (Other)   REVIEW OF SYSTEMS: ROS   Positive for: Musculoskeletal, Endocrine, Cardiovascular, Eyes Negative for: Constitutional, Gastrointestinal, Neurological, Skin, Genitourinary, HENT, Respiratory, Psychiatric, Allergic/Imm, Heme/Lymph Last edited by Elnor Avelina RAMAN, COT on 10/21/2024  1:49 PM.         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    Age 53, led to Mitral valve prolapse   Past Surgical History:  Procedure Laterality Date   ABDOMINAL HYSTERECTOMY     APPENDECTOMY     BLADDER SUSPENSION     BREAST BIOPSY Left    BREAST EXCISIONAL BIOPSY Left    CATARACT EXTRACTION     CHOLECYSTECTOMY     DILATION AND CURETTAGE OF UTERUS     EYE SURGERY     HEAD & NECK SKIN LESION EXCISIONAL BIOPSY     top left scalp   LUMBAR LAMINECTOMY/DECOMPRESSION MICRODISCECTOMY N/A 08/21/2019   Procedure: Lumbar decompression L3-4 right, microdiscectomy L3-4 right;  Surgeon: Heide Ingle, MD;  Location: [facility];  Service: Orthopedics;  Laterality: N/A;    SHOULDER ARTHROSCOPY WITH SUBACROMIAL DECOMPRESSION Right 01/30/2013   Procedure: SHOULDER ARTHROSCOPY WITH SUBACROMIAL DECOMPRESSION;  Surgeon: Lynwood SHAUNNA Bern, MD;  Location: [facility];  Service: Orthopedics;  Laterality: Right;  with labral debridement   FAMILY HISTORY Family History  Problem Relation Age of Onset   Heart disease Father    Breast cancer Maternal Aunt    Diabetes Brother    Leukemia Sister    Colon cancer Neg Hx    Esophageal cancer Neg Hx    Rectal cancer Neg Hx    Stomach cancer Neg Hx    SOCIAL HISTORY Social History   Tobacco Use   Smoking status: Never   Smokeless tobacco: Never  Vaping Use   Vaping status: Never Used  Substance Use Topics   Alcohol  use: No   Drug use: No       OPHTHALMIC EXAM: Base Eye Exam     Visual Acuity (Snellen - Linear)       Right Left   Dist [place] 20/40 -1 20/350 +1   Dist ph [place] 20/40 +2 NI         Tonometry (Tonopen, 1:54 PM)       Right Left   Pressure 31 21  Pt given 1 gtts brimonidine OD and 1 gtts Cosopt  OD @1 :57pm        Pupils       Pupils Dark Light Shape React APD   Right PERRL 3 2 Round Minimal None   Left PERRL 3 2 Round Minimal None         Visual Fields       Left Right    Full Full         Extraocular Movement        Right Left    Full, Ortho Full, Ortho         Neuro/Psych     Oriented x3: Yes   Mood/Affect: Normal         Dilation     Both eyes: 1.0% Mydriacyl, 2.5% Phenylephrine  @ 1:57 PM  Slit Lamp and Fundus Exam     Slit Lamp Exam       Right Left   Lids/Lashes Dermatochalasis - upper lid, mild MGD Dermatochalasis - upper lid, Meibomian gland dysfunction   Conjunctiva/Sclera nasal and temporal pinguecula, white and quiet Nasal Pinguecula   Cornea Arcus, 1-2+PEE, fine KP, mild guttata, well healed cataract wound 1-2+ fine Punctate epithelial erosions, Arcus, Temporal Well healed cataract wounds, trace tear film debris, decreased TBUT   Anterior Chamber Deep and  0.5+ fine cell and pigment Deep and clear   Iris Round and dilated Round and dilated   Lens PC IOL in good position, Open posterior capsule PC IOL in good position, 1-2+Posterior capsular opacification   Anterior Vitreous Vitreous syneresis, Posterior vitreous detachment, vitreous condensations, fine cell Vitreous syneresis, Posterior vitreous detachment         Fundus Exam       Right Left   Disc Pink and Sharp Pink and Sharp, mild temporal Peripapillary atrophy, mild PPP   C/D Ratio 0.5 0.4   Macula Flat, Blunted foveal reflex, RPE mottling and clumping, mild atrophy, Drusen, rare MA, trace cystic changes temporal mac --stably improved, no edema Blunted foveal reflex, +CNV with pigment ring -- no surrounding edema -- stably improved, rare MA, +drusen, RPE mottling   Vessels attenuated, mild tortuosity attenuated, Tortuous   Periphery Attached, scattered mild Reticular degeneration, no heme Attached, scattered Reticular degeneration, No heme.           IMAGING AND PROCEDURES  Imaging and Procedures for @TODAY @  OCT, Retina - OU - Both Eyes       Right Eye Quality was good. Central Foveal Thickness: 276. Progression has improved. Findings include normal foveal contour, no IRF, no SRF, myopic  contour, retinal drusen , outer retinal atrophy (Stable improvement in IRF/cystic changes IT macula and fovea, Patchy ORA -- stable, fine vitreous opacities- improved).   Left Eye Quality was poor. Central Foveal Thickness: 222. Progression has been stable. Findings include no IRF, no SRF, abnormal foveal contour, retinal drusen , subretinal hyper-reflective material, intraretinal hyper-reflective material, pigment epithelial detachment, outer retinal atrophy (Persistent focal PED/CNV with stable improvement in overlying SRHM and retinal edema).   Notes *Images captured and stored on drive  Diagnosis / Impression: Mild DME OU OD: Stable improvement in IRF/cystic changes IT macula and fovea, Patchy ORA -- stable, fine vitreous opacities- improved OS: Exudative ARMD - Persistent focal PED/CNV with stable improvement in overlying SRHM and retinal edema  Clinical management:  See below  Abbreviations: NFP - Normal foveal profile. CME - cystoid macular edema. PED - pigment epithelial detachment. IRF - intraretinal fluid. SRF - subretinal fluid. EZ - ellipsoid zone. ERM - epiretinal membrane. ORA - outer retinal atrophy. ORT - outer retinal tubulation. SRHM - subretinal hyper-reflective material             ASSESSMENT/PLAN:   ICD-10-CM   1. Exudative age-related macular degeneration of left eye with active choroidal neovascularization (HCC)  H35.3221 OCT, Retina - OU - Both Eyes    2. Intermediate stage nonexudative age-related macular degeneration of right eye  H35.3112     3. Both eyes affected by mild nonproliferative diabetic retinopathy with macular edema, associated with type 2 diabetes mellitus (HCC)  Z88.6786     4. 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 Amblyopia of left eye  H53.002     7.  Posterior vitreous detachment of left eye  H43.812     8. Pseudophakia of both eyes  Z96.1     9. Bilateral posterior capsular  opacification  H26.493     10. Dry eyes  H04.123     11. 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, OS   - interval conversion to exudative ARMD noted on 7.26.22 - s/p IVA OS #1 (07.26.22), #2 (08.23.22), #3 (09.21.22), #4 (10.13.22), #5 (11.16.22), #6 (12.14.22), #7 (01.11.23), #8 (02.08.23), #9 (03.22.23), #10 (06.07.23), #11 (08.02.23), #12 (10.25.23), #13 (12.29.23)  - BCVA OS stable at 20/200 (stable and at amblyopic baseline) - OCT shows OS: Exudative ARMD - Persistent focal PED/CNV with stable improvement in overlying SRHM and retinal edema, scattered cystic changes - will hold off on injection OS again today - pt in agreement - f/u see below, DFE, OCT  2. Intermediate Age related macular degeneration, non-exudative, OD -- stable - exam/OCT today shows OD: persistent IRF/cystic changes IT macula and fovea -- slightly improved; Patchy ORA - stable, fine vitreous opacities  - suspect cystic changes mostly related to DM and HTN  - BCVA OD 20/40 stable  - FA 01.04.21 - no CNVM OU -- no exudative disease  - continue Amsler grid monitoring   3,4. Mild non-proliferative diabetic retinopathy, both eyes  - A1c: 6.2 on 10.09.24 - s/p IVA OD #1 (01.29.24), #2 (02.26.24), #3 (03.25.24), #4 (04.22.24), #5 (05.28.24) -- IVA resistance ================ - s/p IVE OD #1 (07.09.24), #2 (08.12.24), #3 (09.09.24), #4 (10.07.24), #5 (11.04.24) -- IVE resistance ================ - s/p IVV OD #1 (SAMPLE -- 01.13.25), #2 (SAMPLE -- 02.17.25),  #3 (SAMPLE -- 03.24.25), #4 (sample -- 05.05.25), #5 (06.30.25 sample), #6 (08.25.25 PAP) - exam shows rare MA/IRH - FA 01.04.21 - late leaking, perifoveal MA OU; no NV OU - OCT shows OD: Stable improvement in IRF/cystic changes IT macula and fovea, Patchy ORA -- stable, fine vitreous opacities at 3+ mos since last IVV OD; OS: Exudative ARMD - Persistent focal PED/CNV with stable improvement in overlying SRHM and  retinal edema - BCVA OD 20/40 - stable - recommend holding IVV today OD (12.01.2025) with f/u as scheduled below - RBA of procedure discussed, questions answered - IVV informed consent obtained and signed 01.13.25 - approved for vabysmo  assistance program - f/u in 8 wks - DFE, OCT, possible injection  5. Anterior Uveitis / iridocyclitis OD - Pt presented on 09.18.25 with blurry VA OD w/ onset of new floaters s/p IVV OD on 08.25.25. - Exam on showed fine KP and 1-2+ fine cell and pigment in OD -- started: - Pred forte  QID--decrease to BID   - Ketorolac  QID OD--decrease to BID   - cont Cosopt  BID OU   - ATs QID OU  - pt self-d/c'd all drops about 2-3 wks ago (early-mid Nov 2025)  - exam with some fine Kps and some +AC cell  - restart PF and Ketorolac  BID OD  - restart Cosopt  BID OU  - drop sheet reviewed and given  - f/u in 8 wks -- DFE/OCT  6. Severe amblyopia OS  - history of patching as a child -- no strabismus surgery - VA dropped down to 20/300 from 20/250 (baseline) due to conv to exu ARMD on 07.26.2022  7. PVD / vitreous syneresis OS  - Discussed findings and prognosis  - No RT or RD on 360 scleral depressed exam  - Reviewed s/s of RT/RD  -  strict return precautions for any such RT/RD symptoms   8. Pseudophakia OU  - s/p CE/IOL OU by [place]oz  - beautiful surgeries, doing well - continue to monitor   9. PCO OU  - pt reports some fogginess of vision - s/p YAG Cap OD (01.15.24) -- good opening -- BCVA initially improved to 20/30 from 20/40 - recommend YAG capsulotomy OS - monitor  10. Dry eyes OU  - persistent PEE on slit lamp exam.  - continue artificial tears and lubricating ointment PRN ---patient not using  - monitor  11. Ocular Hypertension OU  - IOP 31,21  - restart cosopt  BID OU  Ophthalmic Meds Ordered this visit:  Meds ordered this encounter  Medications   dorzolamide -timolol  (COSOPT ) 2-0.5 % ophthalmic solution    Sig: Place 1 drop into both eyes 2  (two) times daily.    Dispense:  10 mL    Refill:  6   ketorolac  (ACULAR ) 0.5 % ophthalmic solution    Sig: Place 1 drop into the right eye in the morning and at bedtime.    Dispense:  10 mL    Refill:  3   prednisoLONE  acetate (PRED FORTE ) 1 % ophthalmic suspension    Sig: Place 1 drop into the right eye in the morning and at bedtime.    Dispense:  10 mL    Refill:  3     Return in about 8 weeks (around 12/16/2024) for ant uveritis OD, DFE, OCT.  There are no Patient Instructions on file for this visit.  Explained the diagnoses, plan, and follow up with the patient and they expressed understanding.  Patient expressed understanding of the importance of proper follow up care.   This document serves as a record of services personally performed by Redell JUDITHANN Hans, MD, PhD. It was created on their behalf by Avelina Pereyra, COA an ophthalmic technician. The creation of this record is the provider's dictation and/or activities during the visit.   Electronically signed by: Avelina GORMAN Pereyra, COT  10/21/24  6:32 PM    This document serves as a record of services personally performed by Redell JUDITHANN Hans, MD, PhD. It was created on their behalf by Almetta Pesa, an ophthalmic technician. The creation of this record is the provider's dictation and/or activities during the visit.    Electronically signed by: Almetta Pesa, OA, 10/21/24  6:32 PM  Redell JUDITHANN Hans, M.D., Ph.D. Diseases & Surgery of the Retina and Vitreous [facility] 10/21/2024  I have reviewed the above documentation for accuracy and completeness, and I agree with the above. Redell JUDITHANN Hans, M.D., Ph.D. 10/21/24 6:35 PM   Abbreviations: M myopia (nearsighted); A astigmatism; H hyperopia (farsighted); P presbyopia; Mrx spectacle prescription;  CTL contact lenses; OD right eye; OS left eye; OU both eyes  XT exotropia; ET esotropia; PEK punctate epithelial keratitis; PEE punctate epithelial erosions; DES dry  eye syndrome; MGD meibomian gland dysfunction; ATs artificial tears; PFAT's preservative free artificial tears; NSC nuclear sclerotic cataract; PSC posterior subcapsular cataract; ERM epi-retinal membrane; PVD posterior vitreous detachment; RD retinal detachment; DM diabetes mellitus; DR diabetic retinopathy; NPDR non-proliferative diabetic retinopathy; PDR proliferative diabetic retinopathy; CSME clinically significant macular edema; DME diabetic macular edema; dbh dot blot hemorrhages; CWS cotton wool spot; POAG primary open angle glaucoma; C/D cup-to-disc ratio; HVF humphrey visual field; GVF goldmann visual field; OCT optical coherence tomography; IOP intraocular pressure; BRVO Branch retinal vein occlusion; CRVO central retinal vein occlusion; CRAO central retinal artery  occlusion; BRAO branch retinal artery occlusion; RT retinal tear; SB scleral buckle; PPV pars plana vitrectomy; VH Vitreous hemorrhage; PRP panretinal laser photocoagulation; IVK intravitreal kenalog; VMT vitreomacular traction; MH Macular hole;  NVD neovascularization of the disc; NVE neovascularization elsewhere; AREDS age related eye disease study; ARMD age related macular degeneration; POAG primary open angle glaucoma; EBMD epithelial/anterior basement membrane dystrophy; ACIOL anterior chamber intraocular lens; IOL intraocular lens; PCIOL posterior chamber intraocular lens; Phaco/IOL phacoemulsification with intraocular lens placement; PRK photorefractive keratectomy; LASIK laser assisted in situ keratomileusis; HTN hypertension; DM diabetes mellitus; COPD chronic obstructive pulmonary disease

## 2024-10-08 DIAGNOSIS — I129 Hypertensive chronic kidney disease with stage 1 through stage 4 chronic kidney disease, or unspecified chronic kidney disease: Secondary | ICD-10-CM | POA: Diagnosis not present

## 2024-10-08 DIAGNOSIS — D638 Anemia in other chronic diseases classified elsewhere: Secondary | ICD-10-CM | POA: Diagnosis not present

## 2024-10-08 DIAGNOSIS — E1122 Type 2 diabetes mellitus with diabetic chronic kidney disease: Secondary | ICD-10-CM | POA: Diagnosis not present

## 2024-10-08 DIAGNOSIS — E785 Hyperlipidemia, unspecified: Secondary | ICD-10-CM | POA: Diagnosis not present

## 2024-10-08 DIAGNOSIS — N182 Chronic kidney disease, stage 2 (mild): Secondary | ICD-10-CM | POA: Diagnosis not present

## 2024-10-08 DIAGNOSIS — Z23 Encounter for immunization: Secondary | ICD-10-CM | POA: Diagnosis not present

## 2024-10-08 DIAGNOSIS — E1139 Type 2 diabetes mellitus with other diabetic ophthalmic complication: Secondary | ICD-10-CM | POA: Diagnosis not present

## 2024-10-16 DIAGNOSIS — M25511 Pain in right shoulder: Secondary | ICD-10-CM | POA: Diagnosis not present

## 2024-10-16 DIAGNOSIS — M19012 Primary osteoarthritis, left shoulder: Secondary | ICD-10-CM | POA: Diagnosis not present

## 2024-10-21 ENCOUNTER — Ambulatory Visit (INDEPENDENT_AMBULATORY_CARE_PROVIDER_SITE_OTHER): Admitting: Ophthalmology

## 2024-10-21 ENCOUNTER — Encounter (INDEPENDENT_AMBULATORY_CARE_PROVIDER_SITE_OTHER): Payer: Self-pay | Admitting: Ophthalmology

## 2024-10-21 DIAGNOSIS — E113213 Type 2 diabetes mellitus with mild nonproliferative diabetic retinopathy with macular edema, bilateral: Secondary | ICD-10-CM | POA: Diagnosis not present

## 2024-10-21 DIAGNOSIS — Z7984 Long term (current) use of oral hypoglycemic drugs: Secondary | ICD-10-CM

## 2024-10-21 DIAGNOSIS — H43812 Vitreous degeneration, left eye: Secondary | ICD-10-CM

## 2024-10-21 DIAGNOSIS — H353112 Nonexudative age-related macular degeneration, right eye, intermediate dry stage: Secondary | ICD-10-CM | POA: Diagnosis not present

## 2024-10-21 DIAGNOSIS — H353221 Exudative age-related macular degeneration, left eye, with active choroidal neovascularization: Secondary | ICD-10-CM | POA: Diagnosis not present

## 2024-10-21 DIAGNOSIS — Z961 Presence of intraocular lens: Secondary | ICD-10-CM

## 2024-10-21 DIAGNOSIS — H209 Unspecified iridocyclitis: Secondary | ICD-10-CM

## 2024-10-21 DIAGNOSIS — H26493 Other secondary cataract, bilateral: Secondary | ICD-10-CM

## 2024-10-21 DIAGNOSIS — H53002 Unspecified amblyopia, left eye: Secondary | ICD-10-CM

## 2024-10-21 DIAGNOSIS — H04123 Dry eye syndrome of bilateral lacrimal glands: Secondary | ICD-10-CM

## 2024-10-21 DIAGNOSIS — H40053 Ocular hypertension, bilateral: Secondary | ICD-10-CM

## 2024-10-21 MED ORDER — KETOROLAC TROMETHAMINE 0.5 % OP SOLN: 1.0000 [drp] | Freq: Two times a day (BID) | OPHTHALMIC | 3 refills | Status: AC

## 2024-10-21 MED ORDER — PREDNISOLONE ACETATE 1 % OP SUSP: 1.0000 [drp] | Freq: Two times a day (BID) | OPHTHALMIC | 3 refills | Status: AC

## 2024-10-21 MED ORDER — DORZOLAMIDE HCL-TIMOLOL MAL 2-0.5 % OP SOLN: 1.0000 [drp] | Freq: Two times a day (BID) | OPHTHALMIC | 6 refills | Status: AC

## 2024-12-05 NOTE — Progress Notes (Shared)
 " [facility] - Clinic Note  12/16/2024     CHIEF COMPLAINT Patient presents for No chief complaint on file.   HISTORY OF PRESENT ILLNESS: Monica Hamilton is a 84 y.o. female who presents to the clinic today for:    Patient states vision has cleared, she stopped drops because she 'figured she didn't need them'. Pt d/c drops for 3 weeks at least.   Referring physician: Self-referral -- wife of Preeya Cleckley  HISTORICAL INFORMATION:  Selected notes from the MEDICAL RECORD NUMBER Self referral Ocular Hx: pseudophakia OU [place] 2019) PMH: DM (on metformin), HTN, arthritis,    CURRENT MEDICATIONS: Current Outpatient Medications (Ophthalmic Drugs)  Medication Sig   Bromfenac  Sodium (PROLENSA ) 0.07 % SOLN Place 1 drop into the right eye in the morning, at noon, in the evening, and at bedtime.   dorzolamide -timolol  (COSOPT ) 2-0.5 % ophthalmic solution Place 1 drop into both eyes 2 (two) times daily.   ketorolac  (ACULAR ) 0.5 % ophthalmic solution Place 1 drop into the right eye in the morning and at bedtime.   prednisoLONE  acetate (PRED FORTE ) 1 % ophthalmic suspension Place 1 drop into the right eye in the morning and at bedtime.   No current facility-administered medications for this visit. (Ophthalmic Drugs)   Current Outpatient Medications (Other)  Medication Sig   amLODipine  (NORVASC ) 5 MG tablet Take 5 mg by mouth daily.   aspirin 81 MG EC tablet Take 81 mg by mouth daily.    CINNAMON PO Take 1,000 mg by mouth daily.   gabapentin  (NEURONTIN ) 100 MG capsule gabapentin  100 mg capsule  TAKE 1 CAPSULE BY MOUTH ONCE DAILY AT BEDTIME FOR 30 DAYS   gabapentin  (NEURONTIN ) 300 MG capsule Take 600 mg by mouth at bedtime.   HYDROcodone -acetaminophen  (NORCO) 7.5-325 MG tablet Take 1 tablet by mouth every 6 (six) hours as needed for severe pain ((score 7 to 10)).   metFORMIN (GLUCOPHAGE) 1000 MG tablet Take 1,000 mg by mouth 2 (two) times daily.    methocarbamol   (ROBAXIN ) 500 MG tablet Take 1 tablet (500 mg total) by mouth every 6 (six) hours as needed for muscle spasms.   metoprolol  succinate (TOPROL -XL) 50 MG 24 hr tablet Take 50 mg by mouth daily. Take with or immediately following a meal.   MODERNA COVID-19 VACCINE 100 MCG/0.5ML injection    Multiple Vitamins-Minerals (ICAPS AREDS 2) CAPS Take 1 capsule by mouth 2 (two) times daily.    rosuvastatin (CRESTOR) 20 MG tablet Take by mouth.   valsartan -hydrochlorothiazide  (DIOVAN -HCT) 160-25 MG tablet Take 1 tablet by mouth daily.   No current facility-administered medications for this visit. 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28, led to Mitral valve prolapse   Past Surgical History:  Procedure Laterality Date   ABDOMINAL HYSTERECTOMY     APPENDECTOMY  BLADDER SUSPENSION     BREAST BIOPSY Left    BREAST EXCISIONAL BIOPSY Left    CATARACT EXTRACTION     CHOLECYSTECTOMY     DILATION AND CURETTAGE OF UTERUS     EYE SURGERY     HEAD & NECK SKIN LESION EXCISIONAL BIOPSY     top left scalp   LUMBAR LAMINECTOMY/DECOMPRESSION MICRODISCECTOMY N/A 08/21/2019   Procedure: Lumbar decompression L3-4 right, microdiscectomy L3-4 right;  Surgeon: Heide Ingle, MD;  Location: [facility];  Service: Orthopedics;  Laterality: N/A;    SHOULDER ARTHROSCOPY WITH SUBACROMIAL DECOMPRESSION Right 01/30/2013   Procedure: SHOULDER ARTHROSCOPY WITH SUBACROMIAL DECOMPRESSION;  Surgeon: Lynwood SHAUNNA Bern, MD;  Location: [facility];  Service: Orthopedics;  Laterality: Right;  with labral debridement   FAMILY HISTORY Family History  Problem Relation Age of Onset   Heart disease Father    Breast cancer Maternal Aunt    Diabetes Brother    Leukemia Sister    Colon cancer Neg Hx    Esophageal cancer Neg Hx    Rectal cancer Neg Hx    Stomach cancer Neg Hx    SOCIAL HISTORY Social History   Tobacco Use   Smoking status: Never   Smokeless tobacco: Never  Vaping Use   Vaping status: Never Used  Substance Use Topics   Alcohol  use: No   Drug use: No       OPHTHALMIC EXAM: Not recorded    IMAGING AND PROCEDURES  Imaging and Procedures for @TODAY @          ASSESSMENT/PLAN:   ICD-10-CM   1. Exudative age-related macular degeneration of left eye with active choroidal neovascularization (HCC)  H35.3221     2. Intermediate stage nonexudative age-related macular degeneration of right eye  H35.3112     3. Both eyes affected by mild nonproliferative diabetic retinopathy with macular edema, associated with type 2 diabetes mellitus (HCC)  Z88.6786     4. 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 Amblyopia of left eye  H53.002     7. Posterior vitreous detachment of left eye  H43.812     8. Pseudophakia of both eyes  Z96.1     9. Bilateral posterior capsular opacification  H26.493     10. 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, OS   - interval conversion to exudative ARMD noted on 7.26.22 - s/p IVA OS #1 (07.26.22), #2 (08.23.22), #3 (09.21.22), #4 (10.13.22), #5 (11.16.22), #6 (12.14.22), #7 (01.11.23), #8 (02.08.23), #9 (03.22.23), #10 (06.07.23), #11 (08.02.23), #12 (10.25.23), #13 (12.29.23)  - BCVA OS stable at 20/200 (stable and at amblyopic baseline) - OCT shows OS: Exudative ARMD - Persistent focal PED/CNV with stable  improvement in overlying SRHM and retinal edema, scattered cystic changes - will hold off on injection OS again today - pt in agreement - f/u see below, DFE, OCT  2. Intermediate Age related macular degeneration, non-exudative, OD -- stable - exam/OCT today shows OD: persistent IRF/cystic changes IT macula and fovea -- slightly improved; Patchy ORA - stable, fine vitreous opacities  - suspect cystic changes mostly related to DM and HTN  - BCVA OD 20/40 stable  - FA 01.04.21 - no CNVM OU -- no exudative disease  - continue Amsler grid monitoring    3,4. Mild  non-proliferative diabetic retinopathy, both eyes  - A1c: 6.2 on 10.09.24 - s/p IVA OD #1 (01.29.24), #2 (02.26.24), #3 (03.25.24), #4 (04.22.24), #5 (05.28.24) -- IVA resistance ================ - s/p IVE OD #1 (07.09.24), #2 (08.12.24), #3 (09.09.24), #4 (10.07.24), #5 (11.04.24) -- IVE resistance ================ - s/p IVV OD #1 (SAMPLE -- 01.13.25), #2 (SAMPLE -- 02.17.25),  #3 (SAMPLE -- 03.24.25), #4 (sample -- 05.05.25), #5 (06.30.25 sample), #6 (08.25.25 PAP) - exam shows rare MA/IRH - FA 01.04.21 - late leaking, perifoveal MA OU; no NV OU - OCT shows OD: Stable improvement in IRF/cystic changes IT macula and fovea, Patchy ORA -- stable, fine vitreous opacities at 3+ mos since last IVV OD; OS: Exudative ARMD - Persistent focal PED/CNV with stable improvement in overlying SRHM and retinal edema - BCVA OD 20/40 - stable - recommend holding IVV today OD (12.01.2025) with f/u as scheduled below - RBA of procedure discussed, questions answered - IVV informed consent obtained and signed 01.13.25 - approved for vabysmo  assistance program - f/u in 8 wks - DFE, OCT, possible injection   5. Anterior Uveitis / iridocyclitis OD - Pt presented on 09.18.25 with blurry VA OD w/ onset of new floaters s/p IVV OD on 08.25.25. - Exam on showed fine KP and 1-2+ fine cell and pigment in OD -- started: - Pred forte  QID--decrease to BID   -  Ketorolac  QID OD--decrease to BID   - cont Cosopt  BID OU   - ATs QID OU  - pt self-d/c'd all drops about 2-3 wks ago (early-mid Nov 2025)  - exam with some fine Kps and some +AC cell  - restart PF and Ketorolac  BID OD  - restart Cosopt  BID OU  - drop sheet reviewed and given  - f/u in 8 wks -- DFE/OCT  6. Severe amblyopia OS  - history of patching as a child -- no strabismus surgery - VA dropped down to 20/300 from 20/250 (baseline) due to conv to exu ARMD on 07.26.2022  7. PVD / vitreous syneresis OS  - Discussed findings and prognosis  - No RT or RD on 360 scleral depressed exam  - Reviewed s/s of RT/RD  - strict return precautions for any such RT/RD symptoms   8. Pseudophakia OU  - s/p CE/IOL OU by Roz  - beautiful surgeries, doing well - continue to monitor    9. PCO OU  - pt reports some fogginess of vision - s/p YAG Cap OD (01.15.24) -- good opening -- BCVA initially improved to 20/30 from 20/40 - recommend YAG capsulotomy OS - monitor   10. Dry eyes OU  - persistent PEE on slit lamp exam.  - continue artificial tears and lubricating ointment PRN ---patient not using  - monitor   11. Ocular Hypertension OU  - IOP 31,21  - restart cosopt  BID OU  Ophthalmic Meds Ordered this visit:  No orders of the defined types were placed in this encounter.    No follow-ups on file.  There are no Patient Instructions on file for this visit.  Explained the diagnoses, plan, and follow up with the patient and they expressed understanding.  Patient expressed understanding of the importance of proper follow up care.   This document serves as a record of services personally performed by Redell JUDITHANN Hans, MD, PhD. It was created on their behalf by Paulina Jamse Gay an ophthalmic technician. The creation of this record is the provider's dictation and/or activities during the visit.   Electronically signed by: Alana  JONETTA Gay  12/05/24  11:58 AM   Redell JUDITHANN Hans, M.D.,  Ph.D. Diseases & Surgery of the Retina and Vitreous [facility] 12/16/2024   Abbreviations: M myopia (nearsighted); A astigmatism; H hyperopia (farsighted); P presbyopia; Mrx spectacle prescription;  CTL contact lenses; OD right eye; OS left eye; OU both eyes  XT exotropia; ET esotropia; PEK punctate epithelial keratitis; PEE punctate epithelial erosions; DES dry eye syndrome; MGD meibomian gland dysfunction; ATs artificial tears; PFAT's preservative free artificial tears; NSC nuclear sclerotic cataract; PSC posterior subcapsular cataract; ERM epi-retinal membrane; PVD posterior vitreous detachment; RD retinal detachment; DM diabetes mellitus; DR diabetic retinopathy; NPDR non-proliferative diabetic retinopathy; PDR proliferative diabetic retinopathy; CSME clinically significant macular edema; DME diabetic macular edema; dbh dot blot hemorrhages; CWS cotton wool spot; POAG primary open angle glaucoma; C/D cup-to-disc ratio; HVF humphrey visual field; GVF goldmann visual field; OCT optical coherence tomography; IOP intraocular pressure; BRVO Branch retinal vein occlusion; CRVO central retinal vein occlusion; CRAO central retinal artery occlusion; BRAO branch retinal artery occlusion; RT retinal tear; SB scleral buckle; PPV pars plana vitrectomy; VH Vitreous hemorrhage; PRP panretinal laser photocoagulation; IVK intravitreal kenalog; VMT vitreomacular traction; MH Macular hole;  NVD neovascularization of the disc; NVE neovascularization elsewhere; AREDS age related eye disease study; ARMD age related macular degeneration; POAG primary open angle glaucoma; EBMD epithelial/anterior basement membrane dystrophy; ACIOL anterior chamber intraocular lens; IOL intraocular lens; PCIOL posterior chamber intraocular lens; Phaco/IOL phacoemulsification with intraocular lens placement; PRK photorefractive keratectomy; LASIK laser assisted in situ keratomileusis; HTN hypertension; DM diabetes mellitus;  COPD chronic obstructive pulmonary disease  "

## 2024-12-09 ENCOUNTER — Other Ambulatory Visit (HOSPITAL_COMMUNITY): Payer: Self-pay | Admitting: Nurse Practitioner

## 2024-12-09 DIAGNOSIS — G3184 Mild cognitive impairment, so stated: Secondary | ICD-10-CM

## 2024-12-11 ENCOUNTER — Ambulatory Visit (HOSPITAL_COMMUNITY)
Admission: RE | Admit: 2024-12-11 | Discharge: 2024-12-11 | Disposition: A | Discharge status: Home / Self Care | Source: Ambulatory Visit | Attending: Nurse Practitioner | Admitting: Nurse Practitioner

## 2024-12-11 DIAGNOSIS — G319 Degenerative disease of nervous system, unspecified: Secondary | ICD-10-CM | POA: Diagnosis not present

## 2024-12-11 DIAGNOSIS — G3184 Mild cognitive impairment, so stated: Secondary | ICD-10-CM | POA: Insufficient documentation

## 2024-12-11 NOTE — Progress Notes (Shared)
 " [facility] - Clinic Note  12/18/2024     CHIEF COMPLAINT Patient presents for No chief complaint on file.   HISTORY OF PRESENT ILLNESS: Monica Hamilton is a 84 y.o. female who presents to the clinic today for:    Patient states vision has cleared, she stopped drops because she 'figured she didn't need them'. Pt d/c drops for 3 weeks at least.   Referring physician: Self-referral -- wife of Emagene Merfeld  HISTORICAL INFORMATION:  Selected notes from the MEDICAL RECORD NUMBER Self referral Ocular Hx: pseudophakia OU [place] 2019) PMH: DM (on metformin), HTN, arthritis,    CURRENT MEDICATIONS: Current Outpatient Medications (Ophthalmic Drugs)  Medication Sig   Bromfenac  Sodium (PROLENSA ) 0.07 % SOLN Place 1 drop into the right eye in the morning, at noon, in the evening, and at bedtime.   dorzolamide -timolol  (COSOPT ) 2-0.5 % ophthalmic solution Place 1 drop into both eyes 2 (two) times daily.   ketorolac  (ACULAR ) 0.5 % ophthalmic solution Place 1 drop into the right eye in the morning and at bedtime.   prednisoLONE  acetate (PRED FORTE ) 1 % ophthalmic suspension Place 1 drop into the right eye in the morning and at bedtime.   No current facility-administered medications for this visit. (Ophthalmic Drugs)   Current Outpatient Medications (Other)  Medication Sig   amLODipine  (NORVASC ) 5 MG tablet Take 5 mg by mouth daily.   aspirin 81 MG EC tablet Take 81 mg by mouth daily.    CINNAMON PO Take 1,000 mg by mouth daily.   gabapentin  (NEURONTIN ) 100 MG capsule gabapentin  100 mg capsule  TAKE 1 CAPSULE BY MOUTH ONCE DAILY AT BEDTIME FOR 30 DAYS   gabapentin  (NEURONTIN ) 300 MG capsule Take 600 mg by mouth at bedtime.   HYDROcodone -acetaminophen  (NORCO) 7.5-325 MG tablet Take 1 tablet by mouth every 6 (six) hours as needed for severe pain ((score 7 to 10)).   metFORMIN (GLUCOPHAGE) 1000 MG tablet Take 1,000 mg by mouth 2 (two) times daily.    methocarbamol   (ROBAXIN ) 500 MG tablet Take 1 tablet (500 mg total) by mouth every 6 (six) hours as needed for muscle spasms.   metoprolol  succinate (TOPROL -XL) 50 MG 24 hr tablet Take 50 mg by mouth daily. Take with or immediately following a meal.   MODERNA COVID-19 VACCINE 100 MCG/0.5ML injection    Multiple Vitamins-Minerals (ICAPS AREDS 2) CAPS Take 1 capsule by mouth 2 (two) times daily.    rosuvastatin (CRESTOR) 20 MG tablet Take by mouth.   valsartan -hydrochlorothiazide  (DIOVAN -HCT) 160-25 MG tablet Take 1 tablet by mouth daily.   No current facility-administered medications for this visit. 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48, led to Mitral valve prolapse   Past Surgical History:  Procedure Laterality Date   ABDOMINAL HYSTERECTOMY     APPENDECTOMY  BLADDER SUSPENSION     BREAST BIOPSY Left    BREAST EXCISIONAL BIOPSY Left    CATARACT EXTRACTION     CHOLECYSTECTOMY     DILATION AND CURETTAGE OF UTERUS     EYE SURGERY     HEAD & NECK SKIN LESION EXCISIONAL BIOPSY     top left scalp   LUMBAR LAMINECTOMY/DECOMPRESSION MICRODISCECTOMY N/A 08/21/2019   Procedure: Lumbar decompression L3-4 right, microdiscectomy L3-4 right;  Surgeon: Heide Ingle, MD;  Location: [facility];  Service: Orthopedics;  Laterality: N/A;    SHOULDER ARTHROSCOPY WITH SUBACROMIAL DECOMPRESSION Right 01/30/2013   Procedure: SHOULDER ARTHROSCOPY WITH SUBACROMIAL DECOMPRESSION;  Surgeon: Lynwood SHAUNNA Bern, MD;  Location: [facility];  Service: Orthopedics;  Laterality: Right;  with labral debridement   FAMILY HISTORY Family History  Problem Relation Age of Onset   Heart disease Father    Breast cancer Maternal Aunt    Diabetes Brother    Leukemia Sister    Colon cancer Neg Hx    Esophageal cancer Neg Hx    Rectal cancer Neg Hx    Stomach cancer Neg Hx    SOCIAL HISTORY Social History   Tobacco Use   Smoking status: Never   Smokeless tobacco: Never  Vaping Use   Vaping status: Never Used  Substance Use Topics   Alcohol  use: No   Drug use: No       OPHTHALMIC EXAM: Not recorded    IMAGING AND PROCEDURES  Imaging and Procedures for @TODAY @          ASSESSMENT/PLAN: No diagnosis found.    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, OS   - interval conversion to exudative ARMD noted on 7.26.22 - s/p IVA OS #1 (07.26.22), #2 (08.23.22), #3 (09.21.22), #4 (10.13.22), #5 (11.16.22), #6 (12.14.22), #7 (01.11.23), #8 (02.08.23), #9 (03.22.23), #10 (06.07.23), #11 (08.02.23), #12 (10.25.23), #13 (12.29.23)  - BCVA OS stable at 20/200 (stable and at amblyopic baseline) - OCT shows OS: Exudative ARMD - Persistent focal PED/CNV with stable improvement in overlying SRHM and retinal edema, scattered cystic changes - will hold off on injection OS again today - pt in agreement - f/u see below, DFE, OCT  2. Intermediate Age related macular degeneration, non-exudative, OD -- stable - exam/OCT today shows OD: persistent IRF/cystic changes IT macula and fovea -- slightly improved; Patchy ORA - stable, fine vitreous opacities  - suspect cystic changes mostly related to DM and HTN  - BCVA OD 20/40 stable  - FA 01.04.21 - no CNVM OU -- no exudative disease  - continue Amsler grid monitoring    3,4. Mild non-proliferative diabetic retinopathy, both eyes  - A1c: 6.2 on 10.09.24 - s/p IVA OD #1 (01.29.24), #2 (02.26.24), #3 (03.25.24), #4 (04.22.24), #5 (05.28.24) -- IVA  resistance ================ - s/p IVE OD #1 (07.09.24), #2 (08.12.24), #3 (09.09.24), #4 (10.07.24), #5 (11.04.24) -- IVE resistance ================ - s/p IVV OD #1 (SAMPLE -- 01.13.25), #2 (SAMPLE -- 02.17.25),  #3 (SAMPLE -- 03.24.25), #4 (sample -- 05.05.25), #5 (06.30.25 sample), #6 (08.25.25 PAP) - exam shows rare MA/IRH - FA 01.04.21 - late leaking, perifoveal MA OU; no NV OU - OCT shows OD: Stable improvement in IRF/cystic changes IT macula and fovea, Patchy ORA -- stable, fine vitreous opacities at 3+ mos since last IVV OD; OS: Exudative ARMD - Persistent focal PED/CNV with stable improvement in overlying SRHM and retinal edema - BCVA OD 20/40 - stable - recommend holding IVV today OD (12.01.2025) with f/u as  scheduled below - RBA of procedure discussed, questions answered - IVV informed consent obtained and signed 01.13.25 - approved for vabysmo  assistance program - f/u in 8 wks - DFE, OCT, possible injection   5. Anterior Uveitis / iridocyclitis OD - Pt presented on 09.18.25 with blurry VA OD w/ onset of new floaters s/p IVV OD on 08.25.25. - Exam on showed fine KP and 1-2+ fine cell and pigment in OD -- started: - Pred forte  QID--decrease to BID   - Ketorolac  QID OD--decrease to BID   - cont Cosopt  BID OU   - ATs QID OU  - pt self-d/c'd all drops about 2-3 wks ago (early-mid Nov 2025)  - exam with some fine Kps and some +AC cell  - restart PF and Ketorolac  BID OD  - restart Cosopt  BID OU  - drop sheet reviewed and given  - f/u in 8 wks -- DFE/OCT  6. Severe amblyopia OS  - history of patching as a child -- no strabismus surgery - VA dropped down to 20/300 from 20/250 (baseline) due to conv to exu ARMD on 07.26.2022  7. PVD / vitreous syneresis OS  - Discussed findings and prognosis  - No RT or RD on 360 scleral depressed exam  - Reviewed s/s of RT/RD  - strict return precautions for any such RT/RD symptoms   8. Pseudophakia OU  - s/p CE/IOL OU by Roz  -  beautiful surgeries, doing well - continue to monitor    9. PCO OU  - pt reports some fogginess of vision - s/p YAG Cap OD (01.15.24) -- good opening -- BCVA initially improved to 20/30 from 20/40 - recommend YAG capsulotomy OS - monitor   10. Dry eyes OU  - persistent PEE on slit lamp exam.  - continue artificial tears and lubricating ointment PRN ---patient not using  - monitor   11. Ocular Hypertension OU  - IOP 31,21  - restart cosopt  BID OU  Ophthalmic Meds Ordered this visit:  No orders of the defined types were placed in this encounter.    No follow-ups on file.  There are no Patient Instructions on file for this visit.  Explained the diagnoses, plan, and follow up with the patient and they expressed understanding.  Patient expressed understanding of the importance of proper follow up care.   This document serves as a record of services personally performed by Redell JUDITHANN Hans, MD, PhD. It was created on their behalf by Almetta Pesa, an ophthalmic technician. The creation of this record is the provider's dictation and/or activities during the visit.    Electronically signed by: Almetta Pesa, OA, 12/11/24  8:16 AM   Redell JUDITHANN Hans, M.D., Ph.D. Diseases & Surgery of the Retina and Vitreous [facility] 12/18/2024   Abbreviations: M myopia (nearsighted); A astigmatism; H hyperopia (farsighted); P presbyopia; Mrx spectacle prescription;  CTL contact lenses; OD right eye; OS left eye; OU both eyes  XT exotropia; ET esotropia; PEK punctate epithelial keratitis; PEE punctate epithelial erosions; DES dry eye syndrome; MGD meibomian gland dysfunction; ATs artificial tears; PFAT's preservative free artificial tears; NSC nuclear sclerotic cataract; PSC posterior subcapsular cataract; ERM epi-retinal membrane; PVD posterior vitreous detachment; RD retinal detachment; DM diabetes mellitus; DR diabetic retinopathy; NPDR non-proliferative diabetic  retinopathy; PDR proliferative diabetic retinopathy; CSME clinically significant macular edema; DME diabetic macular edema; dbh dot blot hemorrhages; CWS cotton wool spot; POAG primary open angle glaucoma; C/D cup-to-disc ratio; HVF humphrey visual field; GVF goldmann  visual field; OCT optical coherence tomography; IOP intraocular pressure; BRVO Branch retinal vein occlusion; CRVO central retinal vein occlusion; CRAO central retinal artery occlusion; BRAO branch retinal artery occlusion; RT retinal tear; SB scleral buckle; PPV pars plana vitrectomy; VH Vitreous hemorrhage; PRP panretinal laser photocoagulation; IVK intravitreal kenalog; VMT vitreomacular traction; MH Macular hole;  NVD neovascularization of the disc; NVE neovascularization elsewhere; AREDS age related eye disease study; ARMD age related macular degeneration; POAG primary open angle glaucoma; EBMD epithelial/anterior basement membrane dystrophy; ACIOL anterior chamber intraocular lens; IOL intraocular lens; PCIOL posterior chamber intraocular lens; Phaco/IOL phacoemulsification with intraocular lens placement; PRK photorefractive keratectomy; LASIK laser assisted in situ keratomileusis; HTN hypertension; DM diabetes mellitus; COPD chronic obstructive pulmonary disease  "

## 2024-12-12 ENCOUNTER — Encounter (INDEPENDENT_AMBULATORY_CARE_PROVIDER_SITE_OTHER): Admitting: Ophthalmology

## 2024-12-16 ENCOUNTER — Encounter (INDEPENDENT_AMBULATORY_CARE_PROVIDER_SITE_OTHER): Admitting: Ophthalmology

## 2024-12-16 DIAGNOSIS — Z961 Presence of intraocular lens: Secondary | ICD-10-CM

## 2024-12-16 DIAGNOSIS — H353112 Nonexudative age-related macular degeneration, right eye, intermediate dry stage: Secondary | ICD-10-CM

## 2024-12-16 DIAGNOSIS — Z7984 Long term (current) use of oral hypoglycemic drugs: Secondary | ICD-10-CM

## 2024-12-16 DIAGNOSIS — E113213 Type 2 diabetes mellitus with mild nonproliferative diabetic retinopathy with macular edema, bilateral: Secondary | ICD-10-CM

## 2024-12-16 DIAGNOSIS — H53002 Unspecified amblyopia, left eye: Secondary | ICD-10-CM

## 2024-12-16 DIAGNOSIS — H353221 Exudative age-related macular degeneration, left eye, with active choroidal neovascularization: Secondary | ICD-10-CM

## 2024-12-16 DIAGNOSIS — H209 Unspecified iridocyclitis: Secondary | ICD-10-CM

## 2024-12-16 DIAGNOSIS — H26493 Other secondary cataract, bilateral: Secondary | ICD-10-CM

## 2024-12-16 DIAGNOSIS — H04123 Dry eye syndrome of bilateral lacrimal glands: Secondary | ICD-10-CM

## 2024-12-16 DIAGNOSIS — H43812 Vitreous degeneration, left eye: Secondary | ICD-10-CM

## 2024-12-16 DIAGNOSIS — H40053 Ocular hypertension, bilateral: Secondary | ICD-10-CM

## 2024-12-17 NOTE — Progress Notes (Shared)
 " [facility] - Clinic Note  12/23/2024     CHIEF COMPLAINT Patient presents for No chief complaint on file.   HISTORY OF PRESENT ILLNESS: Monica Hamilton is a 84 y.o. female who presents to the clinic today for:    Patient states vision has cleared, she stopped drops because she 'figured she didn't need them'. Pt d/c drops for 3 weeks at least.   Referring physician: Self-referral -- wife of Syla Devoss  HISTORICAL INFORMATION:  Selected notes from the MEDICAL RECORD NUMBER Self referral Ocular Hx: pseudophakia OU [place] 2019) PMH: DM (on metformin), HTN, arthritis,    CURRENT MEDICATIONS: Current Outpatient Medications (Ophthalmic Drugs)  Medication Sig   Bromfenac  Sodium (PROLENSA ) 0.07 % SOLN Place 1 drop into the right eye in the morning, at noon, in the evening, and at bedtime.   dorzolamide -timolol  (COSOPT ) 2-0.5 % ophthalmic solution Place 1 drop into both eyes 2 (two) times daily.   ketorolac  (ACULAR ) 0.5 % ophthalmic solution Place 1 drop into the right eye in the morning and at bedtime.   prednisoLONE  acetate (PRED FORTE ) 1 % ophthalmic suspension Place 1 drop into the right eye in the morning and at bedtime.   No current facility-administered medications for this visit. (Ophthalmic Drugs)   Current Outpatient Medications (Other)  Medication Sig   amLODipine  (NORVASC ) 5 MG tablet Take 5 mg by mouth daily.   aspirin 81 MG EC tablet Take 81 mg by mouth daily.    CINNAMON PO Take 1,000 mg by mouth daily.   gabapentin  (NEURONTIN ) 100 MG capsule gabapentin  100 mg capsule  TAKE 1 CAPSULE BY MOUTH ONCE DAILY AT BEDTIME FOR 30 DAYS   gabapentin  (NEURONTIN ) 300 MG capsule Take 600 mg by mouth at bedtime.   HYDROcodone -acetaminophen  (NORCO) 7.5-325 MG tablet Take 1 tablet by mouth every 6 (six) hours as needed for severe pain ((score 7 to 10)).   metFORMIN (GLUCOPHAGE) 1000 MG tablet Take 1,000 mg by mouth 2 (two) times daily.    methocarbamol  (ROBAXIN )  500 MG tablet Take 1 tablet (500 mg total) by mouth every 6 (six) hours as needed for muscle spasms.   metoprolol  succinate (TOPROL -XL) 50 MG 24 hr tablet Take 50 mg by mouth daily. Take with or immediately following a meal.   MODERNA COVID-19 VACCINE 100 MCG/0.5ML injection    Multiple Vitamins-Minerals (ICAPS AREDS 2) CAPS Take 1 capsule by mouth 2 (two) times daily.    rosuvastatin (CRESTOR) 20 MG tablet Take by mouth.   valsartan -hydrochlorothiazide  (DIOVAN -HCT) 160-25 MG tablet Take 1 tablet by mouth daily.   No current facility-administered medications for this visit. 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36, led to Mitral valve prolapse   Past Surgical History:  Procedure Laterality Date   ABDOMINAL HYSTERECTOMY     APPENDECTOMY  BLADDER SUSPENSION     BREAST BIOPSY Left    BREAST EXCISIONAL BIOPSY Left    CATARACT EXTRACTION     CHOLECYSTECTOMY     DILATION AND CURETTAGE OF UTERUS     EYE SURGERY     HEAD & NECK SKIN LESION EXCISIONAL BIOPSY     top left scalp   LUMBAR LAMINECTOMY/DECOMPRESSION MICRODISCECTOMY N/A 08/21/2019   Procedure: Lumbar decompression L3-4 right, microdiscectomy L3-4 right;  Surgeon: Heide Ingle, MD;  Location: [facility];  Service: Orthopedics;  Laterality: N/A;    SHOULDER ARTHROSCOPY WITH SUBACROMIAL DECOMPRESSION Right 01/30/2013   Procedure: SHOULDER ARTHROSCOPY WITH SUBACROMIAL DECOMPRESSION;  Surgeon: Lynwood SHAUNNA Bern, MD;  Location: [facility];  Service: Orthopedics;  Laterality: Right;  with labral debridement   FAMILY HISTORY Family History  Problem Relation Age of Onset   Heart disease Father    Breast cancer Maternal Aunt    Diabetes Brother    Leukemia Sister    Colon cancer Neg Hx    Esophageal cancer Neg Hx    Rectal cancer Neg Hx    Stomach cancer Neg Hx    SOCIAL HISTORY Social History   Tobacco Use   Smoking status: Never   Smokeless tobacco: Never  Vaping Use   Vaping status: Never Used  Substance Use Topics   Alcohol  use: No   Drug use: No       OPHTHALMIC EXAM: Not recorded    IMAGING AND PROCEDURES  Imaging and Procedures for @TODAY @          ASSESSMENT/PLAN:   ICD-10-CM   1. Exudative age-related macular degeneration of left eye with active choroidal neovascularization (HCC)  H35.3221     2. Intermediate stage nonexudative age-related macular degeneration of right eye  H35.3112     3. Both eyes affected by mild nonproliferative diabetic retinopathy with macular edema, associated with type 2 diabetes mellitus (HCC)  Z88.6786     4. 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 Amblyopia of left eye  H53.002     7. Posterior vitreous detachment of left eye  H43.812     8. Pseudophakia of both eyes  Z96.1     9. Bilateral posterior capsular opacification  H26.493     10. Dry eyes  H04.123     11. 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, OS   - interval conversion to exudative ARMD noted on 7.26.22 - s/p IVA OS #1 (07.26.22), #2 (08.23.22), #3 (09.21.22), #4 (10.13.22), #5 (11.16.22), #6 (12.14.22), #7 (01.11.23), #8 (02.08.23), #9 (03.22.23), #10 (06.07.23), #11 (08.02.23), #12 (10.25.23), #13 (12.29.23)  - BCVA OS stable at 20/200 (stable and at amblyopic baseline) - OCT shows OS: Exudative ARMD - Persistent focal PED/CNV with stable  improvement in overlying SRHM and retinal edema, scattered cystic changes - will hold off on injection OS again today - pt in agreement - f/u see below, DFE, OCT  2. Intermediate Age related macular degeneration, non-exudative, OD -- stable - exam/OCT today shows OD: persistent IRF/cystic changes IT macula and fovea -- slightly improved; Patchy ORA - stable, fine vitreous opacities  - suspect cystic changes mostly related to DM and HTN  - BCVA OD 20/40 stable  - FA 01.04.21 - no CNVM OU -- no exudative disease  - continue Amsler grid monitoring    3,4.  Mild non-proliferative diabetic retinopathy, both eyes  - A1c: 6.2 on 10.09.24 - s/p IVA OD #1 (01.29.24), #2 (02.26.24), #3 (03.25.24), #4 (04.22.24), #5 (05.28.24) -- IVA resistance ================ - s/p IVE OD #1 (07.09.24), #2 (08.12.24), #3 (09.09.24), #4 (10.07.24), #5 (11.04.24) -- IVE resistance ================ - s/p IVV OD #1 (SAMPLE -- 01.13.25), #2 (SAMPLE -- 02.17.25),  #3 (SAMPLE -- 03.24.25), #4 (sample -- 05.05.25), #5 (06.30.25 sample), #6 (08.25.25 PAP) - exam shows rare MA/IRH - FA 01.04.21 - late leaking, perifoveal MA OU; no NV OU - OCT shows OD: Stable improvement in IRF/cystic changes IT macula and fovea, Patchy ORA -- stable, fine vitreous opacities at 3+ mos since last IVV OD; OS: Exudative ARMD - Persistent focal PED/CNV with stable improvement in overlying SRHM and retinal edema - BCVA OD 20/40 - stable - recommend holding IVV today OD (12.01.2025) with f/u as scheduled below - RBA of procedure discussed, questions answered - IVV informed consent obtained and signed 01.13.25 - approved for vabysmo  assistance program - f/u in 8 wks - DFE, OCT, possible injection   5. Anterior Uveitis / iridocyclitis OD - Pt presented on 09.18.25 with blurry VA OD w/ onset of new floaters s/p IVV OD on 08.25.25. - Exam on showed fine KP and 1-2+ fine cell and pigment in OD -- started: - Pred forte  QID--decrease to BID   -  Ketorolac  QID OD--decrease to BID   - cont Cosopt  BID OU   - ATs QID OU  - pt self-d/c'd all drops about 2-3 wks ago (early-mid Nov 2025)  - exam with some fine Kps and some +AC cell  - restart PF and Ketorolac  BID OD  - restart Cosopt  BID OU  - drop sheet reviewed and given  - f/u in 8 wks -- DFE/OCT  6. Severe amblyopia OS  - history of patching as a child -- no strabismus surgery - VA dropped down to 20/300 from 20/250 (baseline) due to conv to exu ARMD on 07.26.2022  7. PVD / vitreous syneresis OS  - Discussed findings and prognosis  - No RT or RD on 360 scleral depressed exam  - Reviewed s/s of RT/RD  - strict return precautions for any such RT/RD symptoms   8. Pseudophakia OU  - s/p CE/IOL OU by Roz  - beautiful surgeries, doing well - continue to monitor    9. PCO OU  - pt reports some fogginess of vision - s/p YAG Cap OD (01.15.24) -- good opening -- BCVA initially improved to 20/30 from 20/40 - recommend YAG capsulotomy OS - monitor   10. Dry eyes OU  - persistent PEE on slit lamp exam.  - continue artificial tears and lubricating ointment PRN ---patient not using  - monitor   11. Ocular Hypertension OU  - IOP 31,21  - restart cosopt  BID OU  Ophthalmic Meds Ordered this visit:  No orders of the defined types were placed in this encounter.    No follow-ups on file.  There are no Patient Instructions on file for this visit.  Explained the diagnoses, plan, and follow up with the patient and they expressed understanding.  Patient expressed understanding of the importance of proper follow up care.   This document serves as a record of services personally performed by Redell JUDITHANN Hans, MD, PhD. It was created on their behalf by Almetta Pesa, an ophthalmic technician. The creation of this record is the provider's dictation and/or activities during the visit.    Electronically signed by:  Almetta Pesa, OA, 12/17/24  10:53 AM  This document serves as a  record of services personally performed by Redell JUDITHANN Hans, MD, PhD. It was created on their behalf by Paulina Jamse Gay an ophthalmic technician. The creation of this record is the provider's dictation and/or activities during the visit.   Electronically signed by: Paulina JONETTA Gay  12/17/24  10:53 AM    Redell JUDITHANN Hans, M.D., Ph.D. Diseases & Surgery of the Retina and Vitreous [facility] 12/23/2024   Abbreviations: M myopia (nearsighted); A astigmatism; H hyperopia (farsighted); P presbyopia; Mrx spectacle prescription;  CTL contact lenses; OD right eye; OS left eye; OU both eyes  XT exotropia; ET esotropia; PEK punctate epithelial keratitis; PEE punctate epithelial erosions; DES dry eye syndrome; MGD meibomian gland dysfunction; ATs artificial tears; PFAT's preservative free artificial tears; NSC nuclear sclerotic cataract; PSC posterior subcapsular cataract; ERM epi-retinal membrane; PVD posterior vitreous detachment; RD retinal detachment; DM diabetes mellitus; DR diabetic retinopathy; NPDR non-proliferative diabetic retinopathy; PDR proliferative diabetic retinopathy; CSME clinically significant macular edema; DME diabetic macular edema; dbh dot blot hemorrhages; CWS cotton wool spot; POAG primary open angle glaucoma; C/D cup-to-disc ratio; HVF humphrey visual field; GVF goldmann visual field; OCT optical coherence tomography; IOP intraocular pressure; BRVO Branch retinal vein occlusion; CRVO central retinal vein occlusion; CRAO central retinal artery occlusion; BRAO branch retinal artery occlusion; RT retinal tear; SB scleral buckle; PPV pars plana vitrectomy; VH Vitreous hemorrhage; PRP panretinal laser photocoagulation; IVK intravitreal kenalog; VMT vitreomacular traction; MH Macular hole;  NVD neovascularization of the disc; NVE neovascularization elsewhere; AREDS age related eye disease study; ARMD age related macular degeneration; POAG primary open angle glaucoma; EBMD  epithelial/anterior basement membrane dystrophy; ACIOL anterior chamber intraocular lens; IOL intraocular lens; PCIOL posterior chamber intraocular lens; Phaco/IOL phacoemulsification with intraocular lens placement; PRK photorefractive keratectomy; LASIK laser assisted in situ keratomileusis; HTN hypertension; DM diabetes mellitus; COPD chronic obstructive pulmonary disease  "

## 2024-12-18 ENCOUNTER — Encounter (INDEPENDENT_AMBULATORY_CARE_PROVIDER_SITE_OTHER): Admitting: Ophthalmology

## 2024-12-18 DIAGNOSIS — H40053 Ocular hypertension, bilateral: Secondary | ICD-10-CM

## 2024-12-18 DIAGNOSIS — H209 Unspecified iridocyclitis: Secondary | ICD-10-CM

## 2024-12-18 DIAGNOSIS — H26493 Other secondary cataract, bilateral: Secondary | ICD-10-CM

## 2024-12-18 DIAGNOSIS — E113213 Type 2 diabetes mellitus with mild nonproliferative diabetic retinopathy with macular edema, bilateral: Secondary | ICD-10-CM

## 2024-12-18 DIAGNOSIS — H53002 Unspecified amblyopia, left eye: Secondary | ICD-10-CM

## 2024-12-18 DIAGNOSIS — Z961 Presence of intraocular lens: Secondary | ICD-10-CM

## 2024-12-18 DIAGNOSIS — H04123 Dry eye syndrome of bilateral lacrimal glands: Secondary | ICD-10-CM

## 2024-12-18 DIAGNOSIS — Z7984 Long term (current) use of oral hypoglycemic drugs: Secondary | ICD-10-CM

## 2024-12-18 DIAGNOSIS — H353112 Nonexudative age-related macular degeneration, right eye, intermediate dry stage: Secondary | ICD-10-CM

## 2024-12-18 DIAGNOSIS — H43812 Vitreous degeneration, left eye: Secondary | ICD-10-CM

## 2024-12-18 DIAGNOSIS — H353221 Exudative age-related macular degeneration, left eye, with active choroidal neovascularization: Secondary | ICD-10-CM

## 2024-12-23 ENCOUNTER — Encounter (INDEPENDENT_AMBULATORY_CARE_PROVIDER_SITE_OTHER): Admitting: Ophthalmology

## 2024-12-23 DIAGNOSIS — H353221 Exudative age-related macular degeneration, left eye, with active choroidal neovascularization: Secondary | ICD-10-CM

## 2024-12-23 DIAGNOSIS — H43812 Vitreous degeneration, left eye: Secondary | ICD-10-CM

## 2024-12-23 DIAGNOSIS — E113213 Type 2 diabetes mellitus with mild nonproliferative diabetic retinopathy with macular edema, bilateral: Secondary | ICD-10-CM

## 2024-12-23 DIAGNOSIS — Z961 Presence of intraocular lens: Secondary | ICD-10-CM

## 2024-12-23 DIAGNOSIS — H26493 Other secondary cataract, bilateral: Secondary | ICD-10-CM

## 2024-12-23 DIAGNOSIS — H04123 Dry eye syndrome of bilateral lacrimal glands: Secondary | ICD-10-CM

## 2024-12-23 DIAGNOSIS — H40053 Ocular hypertension, bilateral: Secondary | ICD-10-CM

## 2024-12-23 DIAGNOSIS — H209 Unspecified iridocyclitis: Secondary | ICD-10-CM

## 2024-12-23 DIAGNOSIS — Z7984 Long term (current) use of oral hypoglycemic drugs: Secondary | ICD-10-CM

## 2024-12-23 DIAGNOSIS — H53002 Unspecified amblyopia, left eye: Secondary | ICD-10-CM

## 2024-12-23 DIAGNOSIS — H353112 Nonexudative age-related macular degeneration, right eye, intermediate dry stage: Secondary | ICD-10-CM

## 2024-12-24 ENCOUNTER — Ambulatory Visit: Admitting: Neurology

## 2024-12-24 VITALS — BP 166/79 | HR 82 | Ht 66.0 in | Wt 138.8 lb

## 2024-12-24 DIAGNOSIS — R413 Other amnesia: Secondary | ICD-10-CM | POA: Insufficient documentation

## 2024-12-24 NOTE — Patient Instructions (Addendum)
 Navigating Treatment Options  [email or web address]   Lecanemab (Leqembi) Donanemab (Kisunla) Pills Nemanda Aricept   (Galatamine ER  MG 24 hr capsule )

## 2024-12-26 NOTE — Progress Notes (Shared)
 " [facility] - Clinic Note  12/30/2024     CHIEF COMPLAINT Patient presents for No chief complaint on file.   HISTORY OF PRESENT ILLNESS: Monica Hamilton is a 84 y.o. female who presents to the clinic today for:    Patient states vision has cleared, she stopped drops because she 'figured she didn't need them'. Pt d/c drops for 3 weeks at least.   Referring physician: Self-referral -- wife of Monica Hamilton  HISTORICAL INFORMATION:  Selected notes from the MEDICAL RECORD NUMBER Self referral Ocular Hx: pseudophakia OU [place] 2019) PMH: DM (on metformin), HTN, arthritis,    CURRENT MEDICATIONS: Current Outpatient Medications (Ophthalmic Drugs)  Medication Sig   Bromfenac  Sodium (PROLENSA ) 0.07 % SOLN Place 1 drop into the right eye in the morning, at noon, in the evening, and at bedtime.   dorzolamide -timolol  (COSOPT ) 2-0.5 % ophthalmic solution Place 1 drop into both eyes 2 (two) times daily.   ketorolac  (ACULAR ) 0.5 % ophthalmic solution Place 1 drop into the right eye in the morning and at bedtime.   prednisoLONE  acetate (PRED FORTE ) 1 % ophthalmic suspension Place 1 drop into the right eye in the morning and at bedtime.   No current facility-administered medications for this visit. (Ophthalmic Drugs)   Current Outpatient Medications (Other)  Medication Sig   amLODipine  (NORVASC ) 5 MG tablet Take 5 mg by mouth daily.   aspirin 81 MG EC tablet Take 81 mg by mouth daily.    CINNAMON PO Take 1,000 mg by mouth daily.   gabapentin  (NEURONTIN ) 100 MG capsule gabapentin  100 mg capsule  TAKE 1 CAPSULE BY MOUTH ONCE DAILY AT BEDTIME FOR 30 DAYS   gabapentin  (NEURONTIN ) 300 MG capsule Take 600 mg by mouth at bedtime.   galantamine (RAZADYNE ER) 8 MG 24 hr capsule Take 8 mg by mouth daily with breakfast.   HYDROcodone -acetaminophen  (NORCO) 7.5-325 MG tablet Take 1 tablet by mouth every 6 (six) hours as needed for severe pain ((score 7 to 10)).   metFORMIN  (GLUCOPHAGE) 1000 MG tablet Take 1,000 mg by mouth 2 (two) times daily.    methocarbamol  (ROBAXIN ) 500 MG tablet Take 1 tablet (500 mg total) by mouth every 6 (six) hours as needed for muscle spasms.   metoprolol  succinate (TOPROL -XL) 50 MG 24 hr tablet Take 50 mg by mouth daily. Take with or immediately following a meal.   MODERNA COVID-19 VACCINE 100 MCG/0.5ML injection    Multiple Vitamins-Minerals (ICAPS AREDS 2) CAPS Take 1 capsule by mouth 2 (two) times daily.    rosuvastatin (CRESTOR) 20 MG tablet Take by mouth.   valsartan -hydrochlorothiazide  (DIOVAN -HCT) 160-25 MG tablet Take 1 tablet by mouth daily.   No current facility-administered medications for this visit. 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35, led to Mitral valve prolapse   Past  Surgical History:  Procedure Laterality Date   ABDOMINAL HYSTERECTOMY     APPENDECTOMY     BLADDER SUSPENSION     BREAST BIOPSY Left    BREAST EXCISIONAL BIOPSY Left    CATARACT EXTRACTION     CHOLECYSTECTOMY     DILATION AND CURETTAGE OF UTERUS     EYE SURGERY     HEAD & NECK SKIN LESION EXCISIONAL BIOPSY     top left scalp   LUMBAR LAMINECTOMY/DECOMPRESSION MICRODISCECTOMY N/A 08/21/2019   Procedure: Lumbar decompression L3-4 right, microdiscectomy L3-4 right;  Surgeon: Heide Ingle, MD;  Location: [facility];  Service: Orthopedics;  Laterality: N/A;    SHOULDER ARTHROSCOPY WITH SUBACROMIAL DECOMPRESSION Right  01/30/2013   Procedure: SHOULDER ARTHROSCOPY WITH SUBACROMIAL DECOMPRESSION;  Surgeon: Lynwood SHAUNNA Bern, MD;  Location: [facility];  Service: Orthopedics;  Laterality: Right;  with labral debridement   FAMILY HISTORY Family History  Problem Relation Age of Onset   Heart disease Father    Breast cancer Maternal Aunt    Diabetes Brother    Leukemia Sister    Colon cancer Neg Hx    Esophageal cancer Neg Hx    Rectal cancer Neg Hx    Stomach cancer Neg Hx    SOCIAL HISTORY Social History   Tobacco Use   Smoking status: Never   Smokeless tobacco: Never  Vaping Use   Vaping status: Never Used  Substance Use Topics   Alcohol  use: No   Drug use: No       OPHTHALMIC EXAM: Not recorded    IMAGING AND PROCEDURES  Imaging and Procedures for @TODAY @          ASSESSMENT/PLAN:   ICD-10-CM   1. Exudative age-related macular degeneration of left eye with active choroidal neovascularization (HCC)  H35.3221     2. Intermediate stage nonexudative age-related macular degeneration of right eye  H35.3112     3. Both eyes affected by mild nonproliferative diabetic retinopathy with macular edema, associated with type 2 diabetes mellitus (HCC)  Z88.6786     4. 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 Amblyopia of left eye  H53.002     7. Posterior vitreous detachment of left eye  H43.812     8. Pseudophakia of both eyes  Z96.1     9. Bilateral posterior capsular opacification  H26.493     10. 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, OS   - interval conversion to exudative ARMD noted on 7.26.22 - s/p IVA OS #1 (07.26.22), #2 (08.23.22), #3 (09.21.22), #4 (10.13.22), #5 (11.16.22), #6 (12.14.22), #7 (01.11.23), #8 (02.08.23), #9 (03.22.23), #10 (06.07.23), #11 (08.02.23), #12 (10.25.23), #13 (12.29.23)  - BCVA OS stable at 20/200 (stable and at  amblyopic baseline) - OCT shows OS: Exudative ARMD - Persistent focal PED/CNV with stable improvement in overlying SRHM and retinal edema, scattered cystic changes - will hold off on injection OS again today - pt in agreement - f/u see below, DFE, OCT  2. Intermediate Age related macular degeneration, non-exudative, OD -- stable - exam/OCT today shows OD: persistent IRF/cystic changes IT macula and fovea -- slightly improved; Patchy ORA - stable, fine vitreous opacities  - suspect cystic changes mostly related to DM and HTN  - BCVA OD 20/40 stable  - FA 01.04.21 -  no CNVM OU -- no exudative disease  - continue Amsler grid monitoring    3,4. Mild non-proliferative diabetic retinopathy, both eyes  - A1c: 6.2 on 10.09.24 - s/p IVA OD #1 (01.29.24), #2 (02.26.24), #3 (03.25.24), #4 (04.22.24), #5 (05.28.24) -- IVA resistance ================ - s/p IVE OD #1 (07.09.24), #2 (08.12.24), #3 (09.09.24), #4 (10.07.24), #5 (11.04.24) -- IVE resistance ================ - s/p IVV OD #1 (SAMPLE -- 01.13.25), #2 (SAMPLE -- 02.17.25),  #3 (SAMPLE -- 03.24.25), #4 (sample -- 05.05.25), #5 (06.30.25 sample), #6 (08.25.25 PAP) - exam shows rare MA/IRH - FA 01.04.21 - late leaking, perifoveal MA OU; no NV OU - OCT shows OD: Stable improvement in IRF/cystic changes IT macula and fovea, Patchy ORA -- stable, fine vitreous opacities at 3+ mos since last IVV OD; OS: Exudative ARMD - Persistent focal PED/CNV with stable improvement in overlying SRHM and retinal edema - BCVA OD 20/40 - stable - recommend holding IVV today OD (12.01.2025) with f/u as scheduled below - RBA of procedure discussed, questions answered - IVV informed consent obtained and signed 01.13.25 - approved for vabysmo  assistance program - f/u in 8 wks - DFE, OCT, possible injection  5. Anterior Uveitis / iridocyclitis OD - Pt presented on 09.18.25 with blurry VA OD w/ onset of new floaters s/p IVV OD on 08.25.25. - Exam on showed fine KP and  1-2+ fine cell and pigment in OD -- started: - Pred forte  QID--decrease to BID   - Ketorolac  QID OD--decrease to BID   - cont Cosopt  BID OU   - ATs QID OU  - pt self-d/c'd all drops about 2-3 wks ago (early-mid Nov 2025)  - exam with some fine Kps and some +AC cell  - restart PF and Ketorolac  BID OD  - restart Cosopt  BID OU  - drop sheet reviewed and given  - f/u in 8 wks -- DFE/OCT  6. Severe amblyopia OS  - history of patching as a child -- no strabismus surgery - VA dropped down to 20/300 from 20/250 (baseline) due to conv to exu ARMD on 07.26.2022  7. PVD / vitreous syneresis OS  - Discussed findings and prognosis  - No RT or RD on 360 scleral depressed exam  - Reviewed s/s of RT/RD  - strict return precautions for any such RT/RD symptoms   8. Pseudophakia OU  - s/p CE/IOL OU by Roz  - beautiful surgeries, doing well - continue to monitor    9. PCO OU  - pt reports some fogginess of vision - s/p YAG Cap OD (01.15.24) -- good opening -- BCVA initially improved to 20/30 from 20/40 - recommend YAG capsulotomy OS - monitor   10. Dry eyes OU  - persistent PEE on slit lamp exam.  - continue artificial tears and lubricating ointment PRN ---patient not using  - monitor   11. Ocular Hypertension OU  - IOP 31,21  - restart cosopt  BID OU  Ophthalmic Meds Ordered this visit:  No orders of the defined types were placed in this encounter.    No follow-ups on file.  There are no Patient Instructions on file for this visit.  Explained the diagnoses, plan, and follow up with the patient and they expressed understanding.  Patient expressed understanding of the importance of proper follow up care.   This document serves as a record of services personally performed by Redell JUDITHANN Hans, MD, PhD. It was created on their behalf by Paulina Jamse Gay an ophthalmic technician. The creation of  this record is the provider's dictation and/or activities during the visit.    Electronically signed by: Paulina JONETTA Gay  12/26/24  8:38 AM    Redell JUDITHANN Hans, M.D., Ph.D. Diseases & Surgery of the Retina and Vitreous [facility] 12/30/2024  Abbreviations: M myopia (nearsighted); A astigmatism; H hyperopia (farsighted); P presbyopia; Mrx spectacle prescription;  CTL contact lenses; OD right eye; OS left eye; OU both eyes  XT exotropia; ET esotropia; PEK punctate epithelial keratitis; PEE punctate epithelial erosions; DES dry eye syndrome; MGD meibomian gland dysfunction; ATs artificial tears; PFAT's preservative free artificial tears; NSC nuclear sclerotic cataract; PSC posterior subcapsular cataract; ERM epi-retinal membrane; PVD posterior vitreous detachment; RD retinal detachment; DM diabetes mellitus; DR diabetic retinopathy; NPDR non-proliferative diabetic retinopathy; PDR proliferative diabetic retinopathy; CSME clinically significant macular edema; DME diabetic macular edema; dbh dot blot hemorrhages; CWS cotton wool spot; POAG primary open angle glaucoma; C/D cup-to-disc ratio; HVF humphrey visual field; GVF goldmann visual field; OCT optical coherence tomography; IOP intraocular pressure; BRVO Branch retinal vein occlusion; CRVO central retinal vein occlusion; CRAO central retinal artery occlusion; BRAO branch retinal artery occlusion; RT retinal tear; SB scleral buckle; PPV pars plana vitrectomy; VH Vitreous hemorrhage; PRP panretinal laser photocoagulation; IVK intravitreal kenalog; VMT vitreomacular traction; MH Macular hole;  NVD neovascularization of the disc; NVE neovascularization elsewhere; AREDS age related eye disease study; ARMD age related macular degeneration; POAG primary open angle glaucoma; EBMD epithelial/anterior basement membrane dystrophy; ACIOL anterior chamber intraocular lens; IOL intraocular lens; PCIOL posterior chamber intraocular lens; Phaco/IOL phacoemulsification with intraocular lens placement; PRK photorefractive  keratectomy; LASIK laser assisted in situ keratomileusis; HTN hypertension; DM diabetes mellitus; COPD chronic obstructive pulmonary disease  "

## 2024-12-30 ENCOUNTER — Encounter (INDEPENDENT_AMBULATORY_CARE_PROVIDER_SITE_OTHER): Admitting: Ophthalmology

## 2024-12-30 DIAGNOSIS — Z7984 Long term (current) use of oral hypoglycemic drugs: Secondary | ICD-10-CM

## 2024-12-30 DIAGNOSIS — H53002 Unspecified amblyopia, left eye: Secondary | ICD-10-CM

## 2024-12-30 DIAGNOSIS — H353221 Exudative age-related macular degeneration, left eye, with active choroidal neovascularization: Secondary | ICD-10-CM

## 2024-12-30 DIAGNOSIS — H43812 Vitreous degeneration, left eye: Secondary | ICD-10-CM

## 2024-12-30 DIAGNOSIS — H40053 Ocular hypertension, bilateral: Secondary | ICD-10-CM

## 2024-12-30 DIAGNOSIS — Z961 Presence of intraocular lens: Secondary | ICD-10-CM

## 2024-12-30 DIAGNOSIS — E113213 Type 2 diabetes mellitus with mild nonproliferative diabetic retinopathy with macular edema, bilateral: Secondary | ICD-10-CM

## 2024-12-30 DIAGNOSIS — H353112 Nonexudative age-related macular degeneration, right eye, intermediate dry stage: Secondary | ICD-10-CM

## 2024-12-30 DIAGNOSIS — H26493 Other secondary cataract, bilateral: Secondary | ICD-10-CM

## 2024-12-30 DIAGNOSIS — H209 Unspecified iridocyclitis: Secondary | ICD-10-CM

## 2024-12-30 DIAGNOSIS — H04123 Dry eye syndrome of bilateral lacrimal glands: Secondary | ICD-10-CM

## 2025-05-05 ENCOUNTER — Ambulatory Visit: Admitting: Neurology

## 2025-06-23 ENCOUNTER — Ambulatory Visit: Admitting: Neurology
